# Patient Record
Sex: Male | Born: 1950
Health system: Southern US, Community
[De-identification: ages and names within clinical notes are randomized; demographics above are authoritative.]

## PROBLEM LIST (undated history)

## (undated) DIAGNOSIS — C189 Malignant neoplasm of colon, unspecified: Secondary | ICD-10-CM

## (undated) DIAGNOSIS — I10 Essential (primary) hypertension: Secondary | ICD-10-CM

## (undated) DIAGNOSIS — F329 Major depressive disorder, single episode, unspecified: Secondary | ICD-10-CM

## (undated) DIAGNOSIS — F32A Depression, unspecified: Secondary | ICD-10-CM

## (undated) DIAGNOSIS — Z1379 Encounter for other screening for genetic and chromosomal anomalies: Secondary | ICD-10-CM

## (undated) DIAGNOSIS — D374 Neoplasm of uncertain behavior of colon: Secondary | ICD-10-CM

## (undated) DIAGNOSIS — G629 Polyneuropathy, unspecified: Secondary | ICD-10-CM

## (undated) HISTORY — PX: HERNIA REPAIR: SHX51

## (undated) HISTORY — DX: Encounter for other screening for genetic and chromosomal anomalies: Z13.79

## (undated) HISTORY — PX: TONSILLECTOMY: SUR1361

---

## 2000-05-25 ENCOUNTER — Ambulatory Visit (HOSPITAL_BASED_OUTPATIENT_CLINIC_OR_DEPARTMENT_OTHER): Admission: RE | Admit: 2000-05-25 | Discharge: 2000-05-25 | Payer: Self-pay | Admitting: Family Medicine

## 2002-06-29 ENCOUNTER — Emergency Department (HOSPITAL_COMMUNITY): Admission: EM | Admit: 2002-06-29 | Discharge: 2002-06-29 | Payer: Self-pay | Admitting: Emergency Medicine

## 2016-12-10 ENCOUNTER — Emergency Department (HOSPITAL_COMMUNITY)
Admission: EM | Admit: 2016-12-10 | Discharge: 2016-12-10 | Disposition: A | Discharge status: Home / Self Care | Payer: Managed Care, Other (non HMO) | Attending: Emergency Medicine | Admitting: Emergency Medicine

## 2016-12-10 ENCOUNTER — Encounter (HOSPITAL_COMMUNITY): Payer: Self-pay | Admitting: Emergency Medicine

## 2016-12-10 DIAGNOSIS — Z79899 Other long term (current) drug therapy: Secondary | ICD-10-CM | POA: Insufficient documentation

## 2016-12-10 DIAGNOSIS — K625 Hemorrhage of anus and rectum: Secondary | ICD-10-CM | POA: Diagnosis not present

## 2016-12-10 LAB — CBC
HCT: 36.6 % — ABNORMAL LOW (ref 39.0–52.0)
Hemoglobin: 12.1 g/dL — ABNORMAL LOW (ref 13.0–17.0)
MCH: 28.3 pg (ref 26.0–34.0)
MCHC: 33.1 g/dL (ref 30.0–36.0)
MCV: 85.7 fL (ref 78.0–100.0)
Platelets: 191 10*3/uL (ref 150–400)
RBC: 4.27 MIL/uL (ref 4.22–5.81)
RDW: 14.7 % (ref 11.5–15.5)
WBC: 12.4 10*3/uL — ABNORMAL HIGH (ref 4.0–10.5)

## 2016-12-10 LAB — POC OCCULT BLOOD, ED: Fecal Occult Bld: POSITIVE — AB

## 2016-12-10 NOTE — ED Triage Notes (Signed)
Started with bright red bleeding from rectum today.  Pt notice dark red stool when wiping but did not look at stool in commode.  Seen by Dr Brigitte Pulse in [place] at end of December and told he had a small amount of blood in stool.  Told to check into colonoscopy.  Denies any pain at this time.  Last colonoscopy was 10 years ago.  Denies any issues with stools, stool are normal in color (brown) and soft.

## 2016-12-10 NOTE — ED Provider Notes (Signed)
[facility] DEPT Provider Note   CSN: [id number] Arrival date & time: 12/10/16  1317     History   Chief Complaint Chief Complaint  Patient presents with  . Rectal Bleeding    HPI Jeremy Stevenson is a 66 y.o. male.  HPI Patient reports having a bowel movement this afternoon and noted brown stool but blood on the tissue and thus he came to the ER for evaluation.  His never had rectal bleeding like this before.  He does report that he had a Hemoccult blood test that was positive in December.  He is trying to arrange an outpatient colonoscopy.  His last colonoscopy was 10 years ago.  He denies abdominal pain.  No fevers or chills.  No lightheadedness.  Denies recent nausea vomiting or diarrhea.  He reports he otherwise had a normal day and this occurred after lunch.  He's had no recurrent rectal bleeding since then    History reviewed. No pertinent past medical history.  There are no active problems to display for this patient.   Past Surgical History:  Procedure Laterality Date  . HERNIA REPAIR         Home Medications    Prior to Admission medications   Medication Sig Start Date End Date Taking? Authorizing Provider  amLODipine (NORVASC) 5 MG tablet Take 1 tablet by mouth at bedtime. 10/05/16  Yes Historical Provider, MD  chlorthalidone (HYGROTON) 25 MG tablet Take 1 tablet by mouth at bedtime. 11/13/16  Yes Historical Provider, MD  citalopram (CELEXA) 20 MG tablet Take 1 tablet by mouth at bedtime. 12/06/16  Yes Historical Provider, MD  glucosamine-chondroitin 500-400 MG tablet Take 2 tablets by mouth daily.   Yes Historical Provider, MD  losartan (COZAAR) 100 MG tablet Take 1 tablet by mouth at bedtime. 11/29/16  Yes Historical Provider, MD  metoprolol (LOPRESSOR) 100 MG tablet Take 1 tablet by mouth 2 (two) times daily. 11/28/16  Yes Historical Provider, MD  naproxen sodium (ANAPROX) 220 MG tablet Take 220 mg by mouth as needed (joint pain).   Yes Historical  Provider, MD    Family History No family history on file.  Social History Social History  Substance Use Topics  . Smoking status: Never Smoker  . Smokeless tobacco: Never Used  . Alcohol use No     Allergies   Patient has no known allergies.   Review of Systems Review of Systems  All other systems reviewed and are negative.    Physical Exam Updated Vital Signs BP 147/72 (BP Location: Left Arm)   Pulse 76   Temp 98 F (36.7 C) (Oral)   Resp 18   Ht 6\' 2"  (1.88 m)   Wt 300 lb (136.1 kg)   SpO2 97%   BMI 38.52 kg/m   Physical Exam  Constitutional: He is oriented to person, place, and time. He appears well-developed and well-nourished.  HENT:  Head: Normocephalic and atraumatic.  Eyes: EOM are normal.  Neck: Normal range of motion.  Cardiovascular: Normal rate and regular rhythm.   Pulmonary/Chest: Effort normal.  Abdominal: Soft. He exhibits no distension. There is no tenderness.  Genitourinary:  Genitourinary Comments: Normal external rectal examination.  No hemorrhoids or fissures noted.  Nontender digital rectal exam without gross blood.  Hemoccult positive.  Brown stool  Musculoskeletal: Normal range of motion.  Neurological: He is alert and oriented to person, place, and time.  Skin: Skin is warm and dry.  Psychiatric: He has a normal mood and affect. Judgment normal.  Nursing note and vitals reviewed.    ED Treatments / Results  Labs (all labs ordered are listed, but only abnormal results are displayed) Labs Reviewed  CBC - Abnormal; Notable for the following:       Result Value   WBC 12.4 (*)    Hemoglobin 12.1 (*)    HCT 36.6 (*)    All other components within normal limits  POC OCCULT BLOOD, ED - Abnormal; Notable for the following:    Fecal Occult Bld POSITIVE (*)    All other components within normal limits    EKG  EKG Interpretation None       Radiology No results found.  Procedures Procedures (including critical care  time)  Medications Ordered in ED Medications - No data to display   Initial Impression / Assessment and Plan / ED Course  I have reviewed the triage vital signs and the nursing notes.  Pertinent labs & imaging results that were available during my care of the patient were reviewed by me and considered in my medical decision making (see chart for details).     Vital signs stable.  No gross blood on rectal examination.  No recurrent rectal bleeding while in the emergency department.  Hemoglobin stable at 12.  Outpatient GI follow-up for likely need for repeat colonoscopy.  Patient understands to return to the ER for new or worsening symptoms.  Final Clinical Impressions(s) / ED Diagnoses   Final diagnoses:  Rectal bleeding    New Prescriptions New Prescriptions   No medications on file     Jola Schmidt, MD 12/10/16 [phone]

## 2016-12-19 ENCOUNTER — Emergency Department (HOSPITAL_COMMUNITY)
Admission: EM | Admit: 2016-12-19 | Discharge: 2016-12-19 | Disposition: A | Discharge status: Home / Self Care | Payer: Managed Care, Other (non HMO) | Attending: Emergency Medicine | Admitting: Emergency Medicine

## 2016-12-19 ENCOUNTER — Encounter (HOSPITAL_COMMUNITY): Payer: Self-pay

## 2016-12-19 DIAGNOSIS — K922 Gastrointestinal hemorrhage, unspecified: Secondary | ICD-10-CM | POA: Diagnosis not present

## 2016-12-19 DIAGNOSIS — I10 Essential (primary) hypertension: Secondary | ICD-10-CM | POA: Diagnosis not present

## 2016-12-19 DIAGNOSIS — K625 Hemorrhage of anus and rectum: Secondary | ICD-10-CM | POA: Diagnosis present

## 2016-12-19 DIAGNOSIS — Z79899 Other long term (current) drug therapy: Secondary | ICD-10-CM | POA: Insufficient documentation

## 2016-12-19 HISTORY — DX: Essential (primary) hypertension: I10

## 2016-12-19 LAB — COMPREHENSIVE METABOLIC PANEL
ALT: 17 U/L (ref 17–63)
AST: 24 U/L (ref 15–41)
Albumin: 3.4 g/dL — ABNORMAL LOW (ref 3.5–5.0)
Alkaline Phosphatase: 66 U/L (ref 38–126)
Anion gap: 10 (ref 5–15)
BUN: 20 mg/dL (ref 6–20)
CO2: 28 mmol/L (ref 22–32)
Calcium: 9.3 mg/dL (ref 8.9–10.3)
Chloride: 99 mmol/L — ABNORMAL LOW (ref 101–111)
Creatinine, Ser: 1.07 mg/dL (ref 0.61–1.24)
GFR calc Af Amer: >60 mL/min (ref >60)
GFR calc non Af Amer: >60 mL/min (ref >60)
Glucose, Bld: 163 mg/dL — ABNORMAL HIGH (ref 65–99)
Potassium: 3 mmol/L — ABNORMAL LOW (ref 3.5–5.1)
Sodium: 137 mmol/L (ref 135–145)
Total Bilirubin: 0.5 mg/dL (ref 0.3–1.2)
Total Protein: 6.7 g/dL (ref 6.5–8.1)

## 2016-12-19 LAB — CBC
HCT: 35 % — ABNORMAL LOW (ref 39.0–52.0)
Hemoglobin: 11.4 g/dL — ABNORMAL LOW (ref 13.0–17.0)
MCH: 27.9 pg (ref 26.0–34.0)
MCHC: 32.6 g/dL (ref 30.0–36.0)
MCV: 85.8 fL (ref 78.0–100.0)
Platelets: 225 10*3/uL (ref 150–400)
RBC: 4.08 MIL/uL — ABNORMAL LOW (ref 4.22–5.81)
RDW: 14.8 % (ref 11.5–15.5)
WBC: 11.8 10*3/uL — ABNORMAL HIGH (ref 4.0–10.5)

## 2016-12-19 LAB — PROTIME-INR
INR: 1
Prothrombin Time: 13.2 seconds (ref 11.4–15.2)

## 2016-12-19 LAB — TYPE AND SCREEN
ABO/RH(D): AB POS
Antibody Screen: NEGATIVE

## 2016-12-19 LAB — APTT: aPTT: 27 seconds (ref 24–36)

## 2016-12-19 MED ORDER — LORAZEPAM 2 MG/ML IJ SOLN
1.0000 mg | Freq: Once | INTRAMUSCULAR | Status: AC
  Administered 2016-12-19: 1 mg via INTRAVENOUS
  Filled 2016-12-19: qty 1

## 2016-12-19 NOTE — ED Triage Notes (Signed)
Pt was at work today and went to urinate and passed gas and a blood clot came out of rectum. Pt was seen last week for rectal bleeding and continues to wipe blood when he has BM. Stools vary from brown to dark in color

## 2016-12-19 NOTE — ED Provider Notes (Signed)
[facility] DEPT Provider Note   CSN: [id number] Arrival date & time: 12/19/16  1133   By signing my name below, I, [name], attest that this documentation has been prepared under the direction and in the presence of Noemi Chapel, MD. Electronically Signed: [name], Scribe. 12/19/16. 12:30 PM. History   Chief Complaint Chief Complaint  Patient presents with  . Rectal Bleeding     The history is provided by the patient. No language interpreter was used.   HPI Comments: Jeremy Stevenson is a 66 y.o. male who presents to the Emergency Department complaining of rectal bleeding which started about an hour ago. He was using the restroom when he noticed a lump about the size of a silver dollar. He was recently seen here on 12/10/2016 for the same problem. He reports noticing some blood on the tissue every time he wipes during each BM. He denies abdominal pain, nausea, fever, blurry vision, and dizziness. He also denies any pain during BM or on his buttocks. He had a colonoscopy around 10 years ago. He reports that he normally takes 2 to 3 tablets of aleve once a week. He has never had a blood transfusion. He denies having any abdominal surgeries in the past. He has a hx of smoking but hasn't smoked in 15 years. He denies taking blood thinners. The patient has called the GI doctors and requested a referral and has not received one yet.  Past Medical History:  Diagnosis Date  . Hypertension     There are no active problems to display for this patient.   Past Surgical History:  Procedure Laterality Date  . HERNIA REPAIR         Home Medications    Prior to Admission medications   Medication Sig Start Date End Date Taking? Authorizing Provider  amLODipine (NORVASC) 5 MG tablet Take 1 tablet by mouth at bedtime. 10/05/16   Historical Provider, MD  chlorthalidone (HYGROTON) 25 MG tablet Take 1 tablet by mouth at bedtime. 11/13/16   Historical Provider, MD    citalopram (CELEXA) 20 MG tablet Take 1 tablet by mouth at bedtime. 12/06/16   Historical Provider, MD  glucosamine-chondroitin 500-400 MG tablet Take 2 tablets by mouth daily.    Historical Provider, MD  losartan (COZAAR) 100 MG tablet Take 1 tablet by mouth at bedtime. 11/29/16   Historical Provider, MD  metoprolol (LOPRESSOR) 100 MG tablet Take 1 tablet by mouth 2 (two) times daily. 11/28/16   Historical Provider, MD  naproxen sodium (ANAPROX) 220 MG tablet Take 220 mg by mouth as needed (joint pain).    Historical Provider, MD    Family History No family history on file.  Social History Social History  Substance Use Topics  . Smoking status: Never Smoker  . Smokeless tobacco: Never Used  . Alcohol use No     Allergies   Patient has no known allergies.   Review of Systems Review of Systems  Constitutional: Negative for fever.  Eyes: Negative for visual disturbance.  Gastrointestinal: Positive for blood in stool. Negative for abdominal pain, nausea and vomiting.  Neurological: Negative for dizziness.  All other systems reviewed and are negative.   Physical Exam Updated Vital Signs BP 105/91 (BP Location: Left Arm)   Pulse 86   Temp 98.3 F (36.8 C) (Oral)   Resp 18   Ht 6\' 2"  (1.88 m)   Wt 300 lb (136.1 kg)   SpO2 99%   BMI 38.52 kg/m   Physical Exam  Constitutional: He is oriented to person, place, and time. He appears well-developed and well-nourished. No distress.  HENT:  Head: Normocephalic and atraumatic.  Mouth/Throat: Oropharynx is clear and moist. No oropharyngeal exudate.  Eyes: Conjunctivae and EOM are normal. Pupils are equal, round, and reactive to light. Right eye exhibits no discharge. Left eye exhibits no discharge. No scleral icterus.  Neck: Normal range of motion. Neck supple. No JVD present. No thyromegaly present.  Cardiovascular: Normal rate, regular rhythm, normal heart sounds and intact distal pulses.  Exam reveals no gallop and no friction  rub.   No murmur heard. Pulmonary/Chest: Effort normal and breath sounds normal. No respiratory distress. He has no wheezes. He has no rales.  Abdominal: Soft. Bowel sounds are normal. He exhibits no distension and no mass. There is no tenderness.  Musculoskeletal: Normal range of motion. He exhibits no tenderness.  1+ pitting edema.  Lymphadenopathy:    He has no cervical adenopathy.  Neurological: He is alert and oriented to person, place, and time. Coordination normal.  Skin: Skin is warm and dry. No rash noted. He is not diaphoretic. No erythema.  Clubbing of the fingers.  Psychiatric: He has a normal mood and affect. His behavior is normal.  Nursing note and vitals reviewed.    ED Treatments / Results  DIAGNOSTIC STUDIES: Oxygen Saturation is 99% on RA, normal by my interpretation.    COORDINATION OF CARE: 12:04 PM Discussed treatment plan with pt at bedside, which includes labs, and pt agreed to plan.  Labs (all labs ordered are listed, but only abnormal results are displayed) Labs Reviewed  CBC - Abnormal; Notable for the following:       Result Value   WBC 11.8 (*)    RBC 4.08 (*)    Hemoglobin 11.4 (*)    HCT 35.0 (*)    All other components within normal limits  COMPREHENSIVE METABOLIC PANEL  PROTIME-INR  APTT  POC OCCULT BLOOD, ED  TYPE AND SCREEN    Radiology No results found.  Procedures Procedures (including critical care time)  Medications Ordered in ED Medications  LORazepam (ATIVAN) injection 1 mg (1 mg Intravenous Given 12/19/16 1220)     Initial Impression / Assessment and Plan / ED Course  I have reviewed the triage vital signs and the nursing notes.  Pertinent labs & imaging results that were available during my care of the patient were reviewed by me and considered in my medical decision making (see chart for details).     Procedure Note:  Anoscopy  Risks benefits alternatives of the procedure given to the patient Verbal Consent  obtained Patient placed in the lateral decubitus position Anoscopy performed Findings  The pt has normal appear anus / rectum without any fissures  - there is some scaling and red skin on the gluteal fold that could be c/w prior fungal infection - he is currently under treatment by his PCP for this - the internal exam showed a dark red paste - gross blood - no stool, no hemorroids, no masses Patient tolerated procedure without any complaints  He reports that he is unable to be seen by GI until he has PCP referral - will d/w GI to place plan for patient - Hgb has dropped less than 1 gram since 12/10/16  D/w Dr. Laural Golden - at 2:00 PM - states will arrange for f/u and colonscopy next week - the pt is HD stable,  Pt agreeable.  12.1 - 11.4 drop - pt stable for  d/c.  Advised against asa or nsaid use - agreeable.  Office will call pt on Monday to arrange f/u.  Final Clinical Impressions(s) / ED Diagnoses   Final diagnoses:  Lower GI bleed    New Prescriptions New Prescriptions   No medications on file   I personally performed the services described in this documentation, which was scribed in my presence. The recorded information has been reviewed and is accurate.       Noemi Chapel, MD 12/19/16 [phone]

## 2016-12-19 NOTE — Discharge Instructions (Signed)
No Nsaids - ibuprofen / aspirin or aleve

## 2016-12-22 ENCOUNTER — Telehealth (INDEPENDENT_AMBULATORY_CARE_PROVIDER_SITE_OTHER): Payer: Self-pay | Admitting: *Deleted

## 2016-12-22 ENCOUNTER — Encounter (INDEPENDENT_AMBULATORY_CARE_PROVIDER_SITE_OTHER): Payer: Self-pay | Admitting: *Deleted

## 2016-12-22 MED ORDER — PEG 3350-KCL-NA BICARB-NACL 420 G PO SOLR: 4000.0000 mL | Freq: Once | ORAL | 0 refills | Status: AC

## 2016-12-22 NOTE — Telephone Encounter (Signed)
Patient needs trilyte 

## 2016-12-22 NOTE — Telephone Encounter (Signed)
Referring MD/PCP: shah   Procedure: tcs  Reason/Indication:  Rectal bleeding, anemia  Has patient had this procedure before?  Yes, 10 yrs ago  If so, when, by whom and where?    Is there a family history of colon cancer?  no  Who?  What age when diagnosed?    Is patient diabetic?   no      Does patient have prosthetic heart valve or mechanical valve?  no  Do you have a pacemaker?  no  Has patient ever had endocarditis? no  Has patient had joint replacement within last 12 months?  no  Does patient tend to be constipated or take laxatives? no  Does patient have a history of alcohol/drug use?  no  Is patient on Coumadin, Plavix and/or Aspirin? no  Medications: see epic  Allergies: pcn  Medication Adjustment:   Procedure date & time: 01/02/17 at 900

## 2016-12-22 NOTE — Telephone Encounter (Signed)
agree

## 2016-12-24 ENCOUNTER — Other Ambulatory Visit (INDEPENDENT_AMBULATORY_CARE_PROVIDER_SITE_OTHER): Payer: Self-pay | Admitting: *Deleted

## 2016-12-24 DIAGNOSIS — D508 Other iron deficiency anemias: Secondary | ICD-10-CM

## 2016-12-24 DIAGNOSIS — D649 Anemia, unspecified: Secondary | ICD-10-CM | POA: Insufficient documentation

## 2016-12-24 DIAGNOSIS — K625 Hemorrhage of anus and rectum: Secondary | ICD-10-CM | POA: Insufficient documentation

## 2017-01-02 ENCOUNTER — Encounter (HOSPITAL_COMMUNITY): Payer: Self-pay

## 2017-01-02 ENCOUNTER — Ambulatory Visit (HOSPITAL_COMMUNITY)
Admission: RE | Admit: 2017-01-02 | Discharge: 2017-01-02 | Disposition: A | Discharge status: Home / Self Care | Payer: Managed Care, Other (non HMO) | Source: Ambulatory Visit | Attending: Internal Medicine | Admitting: Internal Medicine

## 2017-01-02 ENCOUNTER — Encounter (HOSPITAL_COMMUNITY)
Admission: RE | Disposition: A | Discharge status: Home / Self Care | Payer: Self-pay | Source: Ambulatory Visit | Attending: Internal Medicine

## 2017-01-02 DIAGNOSIS — K635 Polyp of colon: Secondary | ICD-10-CM | POA: Diagnosis not present

## 2017-01-02 DIAGNOSIS — D500 Iron deficiency anemia secondary to blood loss (chronic): Secondary | ICD-10-CM | POA: Diagnosis not present

## 2017-01-02 DIAGNOSIS — K921 Melena: Secondary | ICD-10-CM | POA: Insufficient documentation

## 2017-01-02 DIAGNOSIS — K5140 Inflammatory polyps of colon without complications: Secondary | ICD-10-CM | POA: Diagnosis not present

## 2017-01-02 DIAGNOSIS — K56600 Partial intestinal obstruction, unspecified as to cause: Secondary | ICD-10-CM | POA: Diagnosis not present

## 2017-01-02 DIAGNOSIS — Z538 Procedure and treatment not carried out for other reasons: Secondary | ICD-10-CM | POA: Diagnosis not present

## 2017-01-02 DIAGNOSIS — K625 Hemorrhage of anus and rectum: Secondary | ICD-10-CM | POA: Insufficient documentation

## 2017-01-02 DIAGNOSIS — D508 Other iron deficiency anemias: Secondary | ICD-10-CM

## 2017-01-02 DIAGNOSIS — I10 Essential (primary) hypertension: Secondary | ICD-10-CM | POA: Insufficient documentation

## 2017-01-02 DIAGNOSIS — Z79899 Other long term (current) drug therapy: Secondary | ICD-10-CM | POA: Insufficient documentation

## 2017-01-02 DIAGNOSIS — K6389 Other specified diseases of intestine: Secondary | ICD-10-CM

## 2017-01-02 DIAGNOSIS — K5730 Diverticulosis of large intestine without perforation or abscess without bleeding: Secondary | ICD-10-CM | POA: Insufficient documentation

## 2017-01-02 DIAGNOSIS — D127 Benign neoplasm of rectosigmoid junction: Secondary | ICD-10-CM | POA: Diagnosis not present

## 2017-01-02 DIAGNOSIS — D649 Anemia, unspecified: Secondary | ICD-10-CM

## 2017-01-02 HISTORY — PX: COLONOSCOPY: SHX5424

## 2017-01-02 SURGERY — COLONOSCOPY
Anesthesia: Moderate Sedation

## 2017-01-02 MED ORDER — IOPAMIDOL (ISOVUE-300) INJECTION 61%
INTRAVENOUS | Status: AC
  Filled 2017-01-02: qty 30

## 2017-01-02 MED ORDER — MEPERIDINE HCL 50 MG/ML IJ SOLN
INTRAMUSCULAR | Status: DC | PRN
  Administered 2017-01-02 (×2): 25 mg via INTRAVENOUS

## 2017-01-02 MED ORDER — IOPAMIDOL (ISOVUE-300) INJECTION 61%
100.0000 mL | Freq: Once | INTRAVENOUS | Status: AC | PRN
  Administered 2017-01-02: 100 mL via INTRAVENOUS

## 2017-01-02 MED ORDER — MIDAZOLAM HCL 5 MG/5ML IJ SOLN
INTRAMUSCULAR | Status: DC | PRN
  Administered 2017-01-02: 1 mg via INTRAVENOUS
  Administered 2017-01-02 (×3): 2 mg via INTRAVENOUS

## 2017-01-02 MED ORDER — STERILE WATER FOR IRRIGATION IR SOLN
Status: DC | PRN
  Administered 2017-01-02: 2.5 mL

## 2017-01-02 MED ORDER — SODIUM CHLORIDE 0.9 % IV SOLN
INTRAVENOUS | Status: DC
  Administered 2017-01-02: 10:00:00 via INTRAVENOUS

## 2017-01-02 MED ORDER — MEPERIDINE HCL 50 MG/ML IJ SOLN
INTRAMUSCULAR | Status: AC
  Filled 2017-01-02: qty 1

## 2017-01-02 MED ORDER — MIDAZOLAM HCL 5 MG/5ML IJ SOLN
INTRAMUSCULAR | Status: AC
  Filled 2017-01-02: qty 10

## 2017-01-02 NOTE — Progress Notes (Signed)
Patient returned via wheelchair from Radiology. Dr. Laural Golden notified. Family at bedside.

## 2017-01-02 NOTE — Progress Notes (Signed)
To radiology for CT scan via wheelchair with wife.

## 2017-01-02 NOTE — Progress Notes (Signed)
Dr. Laural Golden in to discuss further plan of care after CT imaging completed with patient and family.

## 2017-01-02 NOTE — H&P (Addendum)
Jeremy Stevenson is an 66 y.o. male.   Chief Complaint: Patient is here for colonoscopy. HPI: Should 66 year old Caucasian male who was receiving noted to have heme-positive stool. Was also noted to have mild anemia with hemoglobin of 11.4 g. He noted single episode that he passes small amount of blood with piece of stool. He denies abdominal pain diarrhea or constipation. Last colonoscopy was 12 years ago and was normal. He rarely takes Aleve for knee pain or so. Family history is negative for CRC.  Past Medical History:  Diagnosis Date  . Hypertension     Past Surgical History:  Procedure Laterality Date  . HERNIA REPAIR      History reviewed. No pertinent family history. Social History:  reports that he has never smoked. He has never used smokeless tobacco. He reports that he does not drink alcohol or use drugs.  Allergies: No Known Allergies  Medications Prior to Admission  Medication Sig Dispense Refill  . amLODipine (NORVASC) 5 MG tablet Take 5 mg by mouth at bedtime.     . chlorthalidone (HYGROTON) 25 MG tablet Take 25 mg by mouth at bedtime.     . citalopram (CELEXA) 20 MG tablet Take 20 mg by mouth at bedtime.     Marland Kitchen losartan (COZAAR) 100 MG tablet Take 100 mg by mouth at bedtime.     . metoprolol (LOPRESSOR) 100 MG tablet Take 100 mg by mouth 2 (two) times daily.     . naproxen sodium (ANAPROX) 220 MG tablet Take 220 mg by mouth as needed (joint pain).      No results found for this or any previous visit (from the past 48 hour(s)). No results found.  ROS  Blood pressure 136/90, pulse 62, temperature 98.2 F (36.8 C), temperature source Oral, resp. rate 15, SpO2 99 %. Physical Exam  Constitutional: He appears well-developed and well-nourished.  HENT:  Mouth/Throat: Oropharynx is clear and moist.  Eyes: Conjunctivae are normal. No scleral icterus.  Neck: No thyromegaly present.  Cardiovascular: Normal rate, regular rhythm and normal heart sounds.   No murmur  heard. Respiratory: Effort normal and breath sounds normal.  GI:  Abdomen is full but small umbilical hernia. It is soft and  nontender without organomegaly or masses.  Musculoskeletal: He exhibits no edema.  Lymphadenopathy:    He has no cervical adenopathy.  Neurological: He is alert.  Skin: Skin is warm and dry.     Assessment/Plan Heme positive stool and anemia. Single episode of hematochezia. Diagnostic colonoscopy.  Hildred Laser, MD 01/02/2017, 11:09 AM

## 2017-01-02 NOTE — Discharge Instructions (Signed)
Resume usual medications and low fiber diet. No driving for 24 hours. Physician will call with results of CT and biopsy when ready.     Colonoscopy, Adult, Care After This sheet gives you information about how to care for yourself after your procedure. Your doctor may also give you more specific instructions. If you have problems or questions, call your doctor. Follow these instructions at home: General instructions  For the first 24 hours after the procedure:  Do not drive or use machinery.  Do not sign important documents.  Do not drink alcohol.  Do your daily activities more slowly than normal.  Eat foods that are soft and easy to digest.  Rest often.  Take over-the-counter or prescription medicines only as told by your doctor.  It is up to you to get the results of your procedure. Ask your doctor, or the department performing the procedure, when your results will be ready. To help cramping and bloating:  Try walking around.  Put heat on your belly (abdomen) as told by your doctor. Use a heat source that your doctor recommends, such as a moist heat pack or a heating pad.  Put a towel between your skin and the heat source.  Leave the heat on for 20-30 minutes.  Remove the heat if your skin turns bright red. This is especially important if you cannot feel pain, heat, or cold. You can get burned. Eating and drinking  Drink enough fluid to keep your pee (urine) clear or pale yellow.  Return to your normal diet as told by your doctor. Avoid heavy or fried foods that are hard to digest.  Avoid drinking alcohol for as long as told by your doctor. Contact a doctor if:  You have blood in your poop (stool) 2-3 days after the procedure. Get help right away if:  You have more than a small amount of blood in your poop.  You see large clumps of tissue (blood clots) in your poop.  Your belly is swollen.  You feel sick to your stomach (nauseous).  You throw up  (vomit).  You have a fever.  You have belly pain that gets worse, and medicine does not help your pain.    Colon Polyps Introduction Polyps are tissue growths inside the body. Polyps can grow in many places, including the large intestine (colon). A polyp may be a round bump or a mushroom-shaped growth. You could have one polyp or several. Most colon polyps are noncancerous (benign). However, some colon polyps can become cancerous over time. What are the causes? The exact cause of colon polyps is not known. What increases the risk? This condition is more likely to develop in people who:  Have a family history of colon cancer or colon polyps.  Are older than 21 or older than 45 if they are African American.  Have inflammatory bowel disease, such as ulcerative colitis or Crohn disease.  Are overweight.  Smoke cigarettes.  Do not get enough exercise.  Drink too much alcohol.  Eat a diet that is:  High in fat and red meat.  Low in fiber.  Had childhood cancer that was treated with abdominal radiation. What are the signs or symptoms? Most polyps do not cause symptoms. If you have symptoms, they may include:  Blood coming from your rectum when having a bowel movement.  Blood in your stool.The stool may look dark red or black.  A change in bowel habits, such as constipation or diarrhea. How is this diagnosed?  This condition is diagnosed with a colonoscopy. This is a procedure that uses a lighted, flexible scope to look at the inside of your colon. How is this treated? Treatment for this condition involves removing any polyps that are found. Those polyps will then be tested for cancer. If cancer is found, your health care provider will talk to you about options for colon cancer treatment. Follow these instructions at home: Diet  Eat plenty of fiber, such as fruits, vegetables, and whole grains.  Eat foods that are high in calcium and vitamin D, such as milk, cheese,  yogurt, eggs, liver, fish, and broccoli.  Limit foods high in fat, red meats, and processed meats, such as hot dogs, sausage, bacon, and lunch meats.  Maintain a healthy weight, or lose weight if recommended by your health care provider. General instructions  Do not smoke cigarettes.  Do not drink alcohol excessively.  Keep all follow-up visits as told by your health care provider. This is important. This includes keeping regularly scheduled colonoscopies. Talk to your health care provider about when you need a colonoscopy.  Exercise every day or as told by your health care provider. Contact a health care provider if:  You have new or worsening bleeding during a bowel movement.  You have new or increased blood in your stool.  You have a change in bowel habits.  You unexpectedly lose weight.   Low-Fiber Diet Fiber is found in fruits, vegetables, and whole grains. A low-fiber diet restricts fibrous foods that are not digested in the small intestine. A diet containing about 10-15 grams of fiber per day is considered low fiber. Low-fiber diets may be used to:  Promote healing and rest the bowel during intestinal flare-ups.  Prevent blockage of a partially obstructed or narrowed gastrointestinal tract.  Reduce fecal weight and volume.  Slow the movement of feces. You may be on a low-fiber diet as a transitional diet following surgery, after an injury (trauma), or because of a short (acute) or lifelong (chronic) illness. Your health care provider will determine the length of time you need to stay on this diet. What do I need to know about a low-fiber diet? Always check the fiber content on the packaging's Nutrition Facts label, especially on foods from the grains list. Ask your dietitian if you have questions about specific foods that are related to your condition, especially if the food is not listed below. In general, a low-fiber food will have less than 2 g of fiber. What foods can  I eat? Grains  All breads and crackers made with white flour. Sweet rolls, doughnuts, waffles, pancakes, [place] toast, bagels. Pretzels, Melba toast, zwieback. Well-cooked cereals, such as cornmeal, farina, or cream cereals. Dry cereals that do not contain whole grains, fruit, or nuts, such as refined corn, wheat, rice, and oat cereals. Potatoes prepared any way without skins, plain pastas and noodles, refined white rice. Use white flour for baking and making sauces. Use allowed list of grains for casseroles, dumplings, and puddings. Vegetables  Strained tomato and vegetable juices. Fresh lettuce, cucumber, spinach. Well-cooked (no skin or pulp) or canned vegetables, such as asparagus, bean sprouts, beets, carrots, green beans, mushrooms, potatoes, pumpkin, spinach, yellow squash, tomato sauce/puree, turnips, yams, and zucchini. Keep servings limited to  cup. Fruits  All fruit juices except prune juice. Cooked or canned fruits without skin and seeds, such as applesauce, apricots, cherries, fruit cocktail, grapefruit, grapes, mandarin oranges, melons, peaches, pears, pineapple, and plums. Fresh fruits without skin, such  as apricots, avocados, bananas, melons, pineapple, nectarines, and peaches. Keep servings limited to  cup or 1 piece. Meat and Other Protein Sources  Ground or well-cooked tender beef, ham, veal, lamb, pork, or poultry. Eggs, plain cheese. Fish, oysters, shrimp, lobster, and other seafood. Liver, organ meats. Smooth nut butters. Dairy  All milk products and alternative dairy substitutes, such as soy, rice, almond, and coconut, not containing added whole nuts, seeds, or added fruit. Beverages  Decaf coffee, fruit, and vegetable juices or smoothies (small amounts, with no pulp or skins, and with fruits from allowed list), sports drinks, herbal tea. Condiments  Ketchup, mustard, vinegar, cream sauce, cheese sauce, cocoa powder. Spices in moderation, such as allspice, basil, bay leaves,  celery powder or leaves, cinnamon, cumin powder, curry powder, ginger, mace, marjoram, onion or garlic powder, oregano, paprika, parsley flakes, ground pepper, rosemary, sage, savory, tarragon, thyme, and turmeric. Sweets and Desserts  Plain cakes and cookies, pie made with allowed fruit, pudding, custard, cream pie. Gelatin, fruit, ice, sherbet, frozen ice pops. Ice cream, ice milk without nuts. Plain hard candy, honey, jelly, molasses, syrup, sugar, chocolate syrup, gumdrops, marshmallows. Limit overall sugar intake. Fats and Oil  Margarine, butter, cream, mayonnaise, salad oils, plain salad dressings made from allowed foods. Choose healthy fats such as olive oil, canola oil, and omega-3 fatty acids (such as found in salmon or tuna) when possible. Other  Bouillon, broth, or cream soups made from allowed foods. Any strained soup. Casseroles or mixed dishes made with allowed foods. The items listed above may not be a complete list of recommended foods or beverages. Contact your dietitian for more options.  What foods are not recommended? Grains  All whole wheat and whole grain breads and crackers. Multigrains, rye, bran seeds, nuts, or coconut. Cereals containing whole grains, multigrains, bran, coconut, nuts, raisins. Cooked or dry oatmeal, steel-cut oats. Coarse wheat cereals, granola. Cereals advertised as high fiber. Potato skins. Whole grain pasta, wild or brown rice. Popcorn. Coconut flour. Bran, buckwheat, corn bread, multigrains, rye, wheat germ. Vegetables  Fresh, cooked or canned vegetables, such as artichokes, asparagus, beet greens, broccoli, Brussels sprouts, cabbage, celery, cauliflower, corn, eggplant, kale, legumes or beans, okra, peas, and tomatoes. Avoid large servings of any vegetables, especially raw vegetables. Fruits  Fresh fruits, such as apples with or without skin, berries, cherries, figs, grapes, grapefruit, guavas, kiwis, mangoes, oranges, papayas, pears, persimmons,  pineapple, and pomegranate. Prune juice and juices with pulp, stewed or dried prunes. Dried fruits, dates, raisins. Fruit seeds or skins. Avoid large servings of all fresh fruits. Meats and Other Protein Sources  Tough, fibrous meats with gristle. Chunky nut butter. Cheese made with seeds, nuts, or other foods not recommended. Nuts, seeds, legumes (beans, including baked beans), dried peas, beans, lentils. Dairy  Yogurt or cheese that contains nuts, seeds, or added fruit. Beverages  Fruit juices with high pulp, prune juice. Caffeinated coffee and teas. Condiments  Coconut, maple syrup, pickles, olives. Sweets and Desserts  Desserts, cookies, or candies that contain nuts or coconut, chunky peanut butter, dried fruits. Jams, preserves with seeds, marmalade. Large amounts of sugar and sweets. Any other dessert made with fruits from the not recommended list. Other  Soups made from vegetables that are not recommended or that contain other foods not recommended. The items listed above may not be a complete list of foods and beverages to avoid. Contact your dietitian for more information.  This information is not intended to replace advice given to you by your health  care provider. Make sure you discuss any questions you have with your health care provider. Document Released: 04/25/2002 Document Revised: 04/10/2016 Document Reviewed: 09/26/2013 Elsevier Interactive Patient Education  2017 [employer].

## 2017-01-02 NOTE — Op Note (Signed)
[facility] Patient Name: Jeremy Stevenson Procedure Date: 01/02/2017 10:44 AM MRN: [id number] Date of Birth: 20-Jan-1951 Attending MD: Hildred Laser , MD CSN: [id number] Age: 66 Admit Type: Outpatient Procedure:                Colonoscopy Indications:              Hematochezia, Iron deficiency anemia secondary to                            chronic blood loss Providers:                Hildred Laser, MD, Charlyne Petrin RN, RN, Aram Candela Referring MD:             Monico Blitz, MD Medicines:                Meperidine 50 mg IV, Midazolam 7 mg IV Complications:            No immediate complications. Estimated Blood Loss:     Estimated blood loss was minimal. Procedure:                Pre-Anesthesia Assessment:                           - Prior to the procedure, a History and Physical                            was performed, and patient medications and                            allergies were reviewed. The patient's tolerance of                            previous anesthesia was also reviewed. The risks                            and benefits of the procedure and the sedation                            options and risks were discussed with the patient.                            All questions were answered, and informed consent                            was obtained. Prior Anticoagulants: The patient has                            taken no previous anticoagulant or antiplatelet                            agents. ASA Grade Assessment: II - A patient with  mild systemic disease. After reviewing the risks                            and benefits, the patient was deemed in                            satisfactory condition to undergo the procedure.                           After obtaining informed consent, the colonoscope                            was passed under direct vision. Throughout the                            procedure, the  patient's blood pressure, pulse, and                            oxygen saturations were monitored continuously. The                            [id number]) scope was introduced through                            the anus with the intention of advancing to the                            ileum. The scope was advanced to the sigmoid colon                            before the procedure was aborted. Medications were                            given. The [id number]) scope was introduced                            through the and advanced to the. The colonoscopy                            was aborted due to a partially obstructing mass.                            Withdrawing the scope and replacing with the                            'babyscope' did not allow for the successful                            completion of the procedure. The colonoscopy was                            performed without difficulty. The patient tolerated  the procedure well. The quality of the bowel                            preparation was adequate. The rectum was                            photographed. Scope In: 11:19:17 AM Scope Out: 11:49:46 AM Total Procedure Duration: 0 hours 30 minutes 29 seconds  Findings:      The perianal and digital rectal examinations were normal.      Multiple medium-mouthed diverticula were found in the sigmoid colon.      A large polypoid lesion was found in the proximal sigmoid colon. The       lesion was fungating. No bleeding was present. This was biopsied with a       cold forceps for histology.      A 6 mm polyp was found in the recto-sigmoid colon. The polyp was       sessile. The polyp was removed with a cold snare. Resection and       retrieval were complete.      The retroflexed view of the distal rectum and anal verge was normal and       showed no anal or rectal abnormalities. Impression:               - The procedure was aborted  due to partially                            obstructing mass.                           - Diverticulosis in the sigmoid colon.                           - Likely malignant polypoid lesion in the proximal                            sigmoid colon nearly obstructing the lumen.                            Biopsied.                           - One 6 mm polyp at the recto-sigmoid colon,                            removed with a cold snare. Resected and retrieved.                           - The distal rectum and anal verge are normal on                            retroflexion view. Moderate Sedation:      Moderate (conscious) sedation was administered by the endoscopy nurse       and supervised by the endoscopist. The following parameters were       monitored: oxygen saturation, heart rate, blood pressure, CO2  capnography and response to care. Total physician intraservice time was       35 minutes. Recommendation:           - Patient has a contact number available for                            emergencies. The signs and symptoms of potential                            delayed complications were discussed with the                            patient. Return to normal activities tomorrow.                            Written discharge instructions were provided to the                            patient.                           - Low fiber diet today.                           - Continue present medications.                           - Await pathology results.                           - Abdomino-pelvic CT with contrast today.                           - Repeat colonoscopy in 3 years for surveillance. Procedure Code(s):        --- Professional ---                           [phone], 52, Colonoscopy, flexible; with removal of                            tumor(s), polyp(s), or other lesion(s) by snare                            technique                           45380, 59,52, Colonoscopy,  flexible; with biopsy,                            single or multiple                           99152, Moderate sedation services provided by the                            same physician or other qualified health care  professional performing the diagnostic or                            therapeutic service that the sedation supports,                            requiring the presence of an independent trained                            observer to assist in the monitoring of the                            patient's level of consciousness and physiological                            status; initial 15 minutes of intraservice time,                            patient age 72 years or older                           [phone], Moderate sedation services; each additional                            15 minutes intraservice time Diagnosis Code(s):        --- Professional ---                           Z53.8, Procedure and treatment not carried out for                            other reasons                           D49.0, Neoplasm of unspecified behavior of                            digestive system                           D12.7, Benign neoplasm of rectosigmoid junction                           K92.1, Melena (includes Hematochezia)                           D50.0, Iron deficiency anemia secondary to blood                            loss (chronic)                           K57.30, Diverticulosis of large intestine without                            perforation or abscess without bleeding CPT copyright 2016 American [employer].  All rights reserved. The codes documented in this report are preliminary and upon coder review may  be revised to meet current compliance requirements. Hildred Laser, MD Hildred Laser, MD 01/02/2017 12:12:38 PM This report has been signed electronically. Number of Addenda: 0

## 2017-01-05 ENCOUNTER — Telehealth (INDEPENDENT_AMBULATORY_CARE_PROVIDER_SITE_OTHER): Payer: Self-pay | Admitting: Internal Medicine

## 2017-01-05 NOTE — Telephone Encounter (Signed)
Patient's spouse Geraldo Pitter called and left a message last night and stated that Dr. Laural Golden did a procedure on her husband Friday afternoon and that Dr. Laural Golden saw a mass.  She stated that Dr. Laural Golden stated that he wanted to do surgery early this week.  She'd like to speak to Dr. Laural Golden to see what is going on, if he found out anything, and to see what's going to happen.  Seemed very concerned.  [phone]

## 2017-01-05 NOTE — Telephone Encounter (Signed)
Per Dr.Rehman he advised the patient and his wife that he would call them when he got the BX results in. This may be later this afternoon or tomorrow .  Patient's wife was called and given this information.

## 2017-01-07 ENCOUNTER — Encounter (HOSPITAL_COMMUNITY): Payer: Self-pay | Admitting: Internal Medicine

## 2017-01-07 ENCOUNTER — Other Ambulatory Visit: Payer: Self-pay | Admitting: General Surgery

## 2017-01-14 NOTE — Patient Instructions (Signed)
Tracker Jeremy Stevenson  01/14/2017   Your procedure is scheduled on: 01/20/2017    Report to [facility] Main  Entrance take [place]  elevators to 3rd floor to  [place] at    0800 AM.  Call this number if you have problems the morning of surgery [phone]   Remember: ONLY 1 PERSON MAY GO WITH YOU TO SHORT STAY TO GET  READY MORNING OF [place].  Do not eat food or drink liquids :After Midnight.             Clear liquid diet the day of bowel prep.  Remember to drink plenty of clear liquids on the day of bowel prep.       Take these medicines the morning of surgery with A SIP OF WATER: metoprolol ( Lopressor)                                You may not have any metal on your body including hair pins and              piercings  Do not wear jewelry, , lotions, powders or perfumes, deodorant                          Men may shave face and neck.   Do not bring valuables to the hospital. [place].  Contacts, dentures or bridgework may not be worn into surgery.  Leave suitcase in the car. After surgery it may be brought to your room.                     Please read over the following fact sheets you were given: _____________________________________________________________________                CLEAR LIQUID DIET   Foods Allowed                                                                     Foods Excluded  Coffee and tea, regular and decaf                             liquids that you cannot  Plain Jell-O in any flavor                                             see through such as: Fruit ices (not with fruit pulp)                                     milk, soups, orange juice  Iced Popsicles  All solid food Carbonated beverages, regular and diet                                    Cranberry, grape and apple juices Sports drinks like Gatorade Lightly seasoned  clear broth or consume(fat free) Sugar, honey syrup  Sample Menu Breakfast                                Lunch                                     Supper Cranberry juice                    Beef broth                            Chicken broth Jell-O                                     Grape juice                           Apple juice Coffee or tea                        Jell-O                                      Popsicle                                                Coffee or tea                        Coffee or tea  _____________________________________________________________________  [facility] - Preparing for Surgery Before surgery, you can play an important role.  Because skin is not sterile, your skin needs to be as free of germs as possible.  You can reduce the number of germs on your skin by washing with CHG (chlorahexidine gluconate) soap before surgery.  CHG is an antiseptic cleaner which kills germs and bonds with the skin to continue killing germs even after washing. Please DO NOT use if you have an allergy to CHG or antibacterial soaps.  If your skin becomes reddened/irritated stop using the CHG and inform your nurse when you arrive at Short Stay. Do not shave (including legs and underarms) for at least 48 hours prior to the first CHG shower.  You may shave your face/neck. Please follow these instructions carefully:  1.  Shower with CHG Soap the night before surgery and the  morning of Surgery.  2.  If you choose to wash your hair, wash your hair first as usual with your  normal  shampoo.  3.  After you shampoo, rinse your hair and body thoroughly to remove the  shampoo.  4.  Use CHG as you would any other liquid soap.  You can apply chg directly  to the skin and wash                       Gently with a scrungie or clean washcloth.  5.  Apply the CHG Soap to your body ONLY FROM THE NECK DOWN.   Do not use on face/ open                           Wound or  open sores. Avoid contact with eyes, ears mouth and genitals (private parts).                       Wash face,  Genitals (private parts) with your normal soap.             6.  Wash thoroughly, paying special attention to the area where your surgery  will be performed.  7.  Thoroughly rinse your body with warm water from the neck down.  8.  DO NOT shower/wash with your normal soap after using and rinsing off  the CHG Soap.                9.  Pat yourself dry with a clean towel.            10.  Wear clean pajamas.            11.  Place clean sheets on your bed the night of your first shower and do not  sleep with pets. Day of Surgery : Do not apply any lotions/deodorants the morning of surgery.  Please wear clean clothes to the hospital/surgery center.  FAILURE TO FOLLOW THESE INSTRUCTIONS MAY RESULT IN THE CANCELLATION OF YOUR SURGERY PATIENT SIGNATURE_________________________________  NURSE SIGNATURE__________________________________  ________________________________________________________________________  WHAT IS A BLOOD TRANSFUSION? Blood Transfusion Information  A transfusion is the replacement of blood or some of its parts. Blood is made up of multiple cells which provide different functions.  Red blood cells carry oxygen and are used for blood loss replacement.  White blood cells fight against infection.  Platelets control bleeding.  Plasma helps clot blood.  Other blood products are available for specialized needs, such as hemophilia or other clotting disorders. BEFORE THE TRANSFUSION  Who gives blood for transfusions?   Healthy volunteers who are fully evaluated to make sure their blood is safe. This is blood bank blood. Transfusion therapy is the safest it has ever been in the practice of medicine. Before blood is taken from a donor, a complete history is taken to make sure that person has no history of diseases nor engages in risky social behavior (examples are  intravenous drug use or sexual activity with multiple partners). The donor'Jeremy travel history is screened to minimize risk of transmitting infections, such as malaria. The donated blood is tested for signs of infectious diseases, such as HIV and hepatitis. The blood is then tested to be sure it is compatible with you in order to minimize the chance of a transfusion reaction. If you or a relative donates blood, this is often done in anticipation of surgery and is not appropriate for emergency situations. It takes many days to process the donated blood. RISKS AND COMPLICATIONS Although transfusion therapy is very safe and saves many lives, the main dangers of transfusion include:   Getting an infectious disease.  Developing a transfusion reaction.  This is an allergic reaction to something in the blood you were given. Every precaution is taken to prevent this. The decision to have a blood transfusion has been considered carefully by your caregiver before blood is given. Blood is not given unless the benefits outweigh the risks. AFTER THE TRANSFUSION  Right after receiving a blood transfusion, you will usually feel much better and more energetic. This is especially true if your red blood cells have gotten low (anemic). The transfusion raises the level of the red blood cells which carry oxygen, and this usually causes an energy increase.  The nurse administering the transfusion will monitor you carefully for complications. HOME CARE INSTRUCTIONS  No special instructions are needed after a transfusion. You may find your energy is better. Speak with your caregiver about any limitations on activity for underlying diseases you may have. SEEK MEDICAL CARE IF:   Your condition is not improving after your transfusion.  You develop redness or irritation at the intravenous (IV) site. SEEK IMMEDIATE MEDICAL CARE IF:  Any of the following symptoms occur over the next 12 hours:  Shaking chills.  You have a  temperature by mouth above 102 F (38.9 C), not controlled by medicine.  Chest, back, or muscle pain.  People around you feel you are not acting correctly or are confused.  Shortness of breath or difficulty breathing.  Dizziness and fainting.  You get a rash or develop hives.  You have a decrease in urine output.  Your urine turns a dark color or changes to pink, red, or brown. Any of the following symptoms occur over the next 10 days:  You have a temperature by mouth above 102 F (38.9 C), not controlled by medicine.  Shortness of breath.  Weakness after normal activity.  The white part of the eye turns yellow (jaundice).  You have a decrease in the amount of urine or are urinating less often.  Your urine turns a dark color or changes to pink, red, or brown. Document Released: 10/31/2000 Document Revised: 01/26/2012 Document Reviewed: 06/19/2008 [facility] Patient Information 2014 [place], [place].  _______________________________________________________________________

## 2017-01-16 ENCOUNTER — Ambulatory Visit (HOSPITAL_COMMUNITY)
Admission: RE | Admit: 2017-01-16 | Discharge: 2017-01-16 | Disposition: A | Discharge status: Home / Self Care | Payer: Managed Care, Other (non HMO) | Source: Ambulatory Visit | Attending: General Surgery | Admitting: General Surgery

## 2017-01-16 ENCOUNTER — Encounter (HOSPITAL_COMMUNITY)
Admission: RE | Admit: 2017-01-16 | Discharge: 2017-01-16 | Disposition: A | Discharge status: Home / Self Care | Payer: Managed Care, Other (non HMO) | Source: Ambulatory Visit | Attending: General Surgery | Admitting: General Surgery

## 2017-01-16 ENCOUNTER — Encounter (HOSPITAL_COMMUNITY): Payer: Self-pay

## 2017-01-16 DIAGNOSIS — Z01812 Encounter for preprocedural laboratory examination: Secondary | ICD-10-CM | POA: Insufficient documentation

## 2017-01-16 DIAGNOSIS — Z01810 Encounter for preprocedural cardiovascular examination: Secondary | ICD-10-CM | POA: Insufficient documentation

## 2017-01-16 DIAGNOSIS — Z01818 Encounter for other preprocedural examination: Secondary | ICD-10-CM

## 2017-01-16 LAB — URINALYSIS, ROUTINE W REFLEX MICROSCOPIC
Bilirubin Urine: NEGATIVE
Glucose, UA: NEGATIVE mg/dL
Hgb urine dipstick: NEGATIVE
Ketones, ur: NEGATIVE mg/dL
Leukocytes, UA: NEGATIVE
Nitrite: NEGATIVE
Protein, ur: NEGATIVE mg/dL
Specific Gravity, Urine: 1.014 (ref 1.005–1.030)
pH: 7 (ref 5.0–8.0)

## 2017-01-16 LAB — COMPREHENSIVE METABOLIC PANEL
ALT: 16 U/L — ABNORMAL LOW (ref 17–63)
AST: 18 U/L (ref 15–41)
Albumin: 3.5 g/dL (ref 3.5–5.0)
Alkaline Phosphatase: 75 U/L (ref 38–126)
Anion gap: 7 (ref 5–15)
BUN: 15 mg/dL (ref 6–20)
CO2: 32 mmol/L (ref 22–32)
Calcium: 9.7 mg/dL (ref 8.9–10.3)
Chloride: 98 mmol/L — ABNORMAL LOW (ref 101–111)
Creatinine, Ser: 1 mg/dL (ref 0.61–1.24)
GFR calc Af Amer: >60 mL/min (ref >60)
GFR calc non Af Amer: >60 mL/min (ref >60)
Glucose, Bld: 124 mg/dL — ABNORMAL HIGH (ref 65–99)
Potassium: 3.9 mmol/L (ref 3.5–5.1)
Sodium: 137 mmol/L (ref 135–145)
Total Bilirubin: 0.8 mg/dL (ref 0.3–1.2)
Total Protein: 7.2 g/dL (ref 6.5–8.1)

## 2017-01-16 LAB — CBC WITH DIFFERENTIAL/PLATELET
Basophils Absolute: 0 10*3/uL (ref 0.0–0.1)
Basophils Relative: 0 %
Eosinophils Absolute: 0.7 10*3/uL (ref 0.0–0.7)
Eosinophils Relative: 5 %
HCT: 36.3 % — ABNORMAL LOW (ref 39.0–52.0)
Hemoglobin: 11.6 g/dL — ABNORMAL LOW (ref 13.0–17.0)
Lymphocytes Relative: 16 %
Lymphs Abs: 2 10*3/uL (ref 0.7–4.0)
MCH: 26.9 pg (ref 26.0–34.0)
MCHC: 32 g/dL (ref 30.0–36.0)
MCV: 84.2 fL (ref 78.0–100.0)
Monocytes Absolute: 0.9 10*3/uL (ref 0.1–1.0)
Monocytes Relative: 8 %
Neutro Abs: 8.7 10*3/uL — ABNORMAL HIGH (ref 1.7–7.7)
Neutrophils Relative %: 71 %
Platelets: 256 10*3/uL (ref 150–400)
RBC: 4.31 MIL/uL (ref 4.22–5.81)
RDW: 14.5 % (ref 11.5–15.5)
WBC: 12.4 10*3/uL — ABNORMAL HIGH (ref 4.0–10.5)

## 2017-01-16 LAB — ABO/RH: ABO/RH(D): AB POS

## 2017-01-16 NOTE — Progress Notes (Signed)
   01/16/17 0859  OBSTRUCTIVE SLEEP APNEA  Have you ever been diagnosed with sleep apnea through a sleep study? No  Do you snore loudly (loud enough to be heard through closed doors)?  1  Do you often feel tired, fatigued, or sleepy during the daytime (such as falling asleep during driving or talking to someone)? 0  Has anyone observed you stop breathing during your sleep? 1  Do you have, or are you being treated for high blood pressure? 1  BMI more than 35 kg/m2? 1  Age > 50 (1-yes) 1  Neck circumference greater than:Male 16 inches or larger, Male 17inches or larger? 1  Male Gender (Yes=1) 1  Obstructive Sleep Apnea Score 7

## 2017-01-17 LAB — HEMOGLOBIN A1C
Hgb A1c MFr Bld: 6.5 % — ABNORMAL HIGH (ref 4.8–5.6)
Mean Plasma Glucose: 140 mg/dL

## 2017-01-17 LAB — CEA: CEA: 27 ng/mL — ABNORMAL HIGH (ref 0.0–4.7)

## 2017-01-19 NOTE — H&P (Signed)
Jeremy Stevenson Location: [facility] Patient #: [id number] DOB: July 21, 1951 Married / Language: English / Race: White Male        History of Present Illness       This is a 66 year old man, referred by Dr. Laural Golden in [facility] for a neoplastic mass of the proximal sigmoid colon. Dr. Manuella Ghazi s his PCP in [place].       He has no prior history of intestinal disease. He knows he has a small umbilical hernia. He recently saw some blood in his stool on 2 separate occasions. This has all been within the last month. He denies abdominal pain, cramps, diarrhea. He says his bowel movements are normal in texture and caliber. He was noted to have Hemoccult positive stools and a hemoglobin of 11.4.      Colonoscopy was performed on January 02, 2017. There was a polypoid mass in the proximal sigmoid colon and and a tiny hyperplastic polyp at the rectosigmoid junction. They were unable to pass the scope beyond the mass. Biopsies just showed necrotic tissue. CT scan shows an 8 cm mass in the proximal sigmoid almost at the sigmoid descending junction. The question whether this might be intussuscepting but there was no obstruction or dilatation of the small bowel or large bowel. There was nonspecific portal hepatitis lymphadenopathy. Some atherosclerotic changes in the aortic vessels but no aneurysm. BPH noted. Fat containing umbilical and inguinal hernias. CEA is ordered.       Past history reveals that one doctor told him he has sleep apnea but no devices. Hernia repair as an infant. Elevated PSA. BMI 38. Former smoker. Hypertension.  family history is negative for colorectal cancer. Father died of myocardial infarction. Mother had breast cancer. Sister had ovarian cancer He is married. His wife and daughter are here with him. 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       We had a long talk. I let him know that although the biopsy does not  show cancer, that this should be treated as a cancer until proven otherwise. Because of the partially obstructing nature of this I advised double dose stool softeners twice a day and a soft diet. I advised expediting his surgery in hopes of a single-stage resection. He agrees.       He will be scheduled for laparoscopic-assisted left colectomy, possible open colectomy. He will receive mechanical and antibiotic bowel prep preop and we went over that with him. I discussed the indications, details, techniques, and numerous risk of the surgery with him and his family. He is aware of the risk of bleeding, infection, large incision, wound hernia, injury to adjacent organs with major reconstructive surgery including the ureter and major vessels, anastomotic leak with reoperation and colostomy, cardiac pulmonary and thromboembolic problems. He knows that he is at increased risk because of his weight. He understands all these issues. At this time all of his questions are answered. He agrees with this plan.    Past Surgical History  Colon Polyp Removal - Colonoscopy  Colon Polyp Removal - Open  Tonsillectomy  Vasectomy   Diagnostic Studies History  Colonoscopy  within last year  Allergies  No Known Drug Allergies   Medication History  AmLODIPine Besylate (5MG  Tablet, Oral) Active. Chlorthalidone (25MG  Tablet, Oral) Active. Citalopram Hydrobromide (20MG  Tablet, Oral) Active. Losartan Potassium (100MG  Tablet, Oral) Active. Metoprolol Tartrate (100MG  Tablet, Oral) Active. Medications Reconciled  Social History  Alcohol use  Remotely quit alcohol use.  Caffeine use  Carbonated beverages, Coffee, Tea. Tobacco use  Former smoker.  Family History  Arthritis  Mother. Breast Cancer  Mother. Heart Disease  Father.  Other Problems  Bladder Problems  High blood pressure     Review of Systems General Not Present- Appetite Loss, Chills, Fatigue, Fever, Night  Sweats, Weight Gain and Weight Loss. Skin Not Present- Change in Wart/Mole, Dryness, Hives, Jaundice, New Lesions, Non-Healing Wounds, Rash and Ulcer. HEENT Present- Hearing Loss. Not Present- Earache, Hoarseness, Nose Bleed, Oral Ulcers, Ringing in the Ears, Seasonal Allergies, Sinus Pain, Sore Throat, Visual Disturbances, Wears glasses/contact lenses and Yellow Eyes. Cardiovascular Present- Shortness of Breath. Not Present- Chest Pain, Difficulty Breathing Lying Down, Leg Cramps, Palpitations, Rapid Heart Rate and Swelling of Extremities. Gastrointestinal Present- Bloody Stool. Not Present- Abdominal Pain, Bloating, Change in Bowel Habits, Chronic diarrhea, Constipation, Difficulty Swallowing, Excessive gas, Gets full quickly at meals, Hemorrhoids, Indigestion, Nausea, Rectal Pain and Vomiting. Male Genitourinary Present- Frequency. Not Present- Blood in Urine, Change in Urinary Stream, Impotence, Nocturia, Painful Urination, Urgency and Urine Leakage.  Vitals  Weight: 296.5 lb Height: 74in Body Surface Area: 2.57 m Body Mass Index: 38.07 kg/m  Pulse: 65 (Regular)  BP: 142/86 (Sitting, Left Arm, Standard)   Physical Exam  General Mental Status-Alert. General Appearance-Consistent with stated age. Hydration-Well hydrated. Voice-Normal. Note: Large man. BMI 38   Head and Neck Head-normocephalic, atraumatic with no lesions or palpable masses. Trachea-midline. Thyroid Gland Characteristics - normal size and consistency.  Eye Eyeball - Bilateral-Extraocular movements intact. Sclera/Conjunctiva - Bilateral-No scleral icterus.  Chest and Lung Exam Chest and lung exam reveals -quiet, even and easy respiratory effort with no use of accessory muscles and on auscultation, normal breath sounds, no adventitious sounds and normal vocal resonance. Inspection Chest Wall - Normal. Back - normal.  Breast Breast - Left-Symmetric, Non Tender, No Biopsy scars, no  Dimpling, No Inflammation, No Lumpectomy scars, No Mastectomy scars, No Peau d' Orange. Breast - Right-Symmetric, Non Tender, No Biopsy scars, no Dimpling, No Inflammation, No Lumpectomy scars, No Mastectomy scars, No Peau d' Orange. Breast Lump-No Palpable Breast Mass.  Cardiovascular Cardiovascular examination reveals -normal heart sounds, regular rate and rhythm with no murmurs and normal pedal pulses bilaterally.  Abdomen Inspection  Inspection of the abdomen reveals: Note: Partially incarcerated small umbilical hernia. Skin - Scar - Note: Transverse scar and inguinal area. I do not detect a mass or hernia in the inguinal area. Palpation/Percussion Palpation and Percussion of the abdomen reveal - Soft, Non Tender, No Rebound tenderness, No Rigidity (guarding) and No hepatosplenomegaly. Auscultation Auscultation of the abdomen reveals - Bowel sounds normal.  Neurologic Neurologic evaluation reveals -alert and oriented x 3 with no impairment of recent or remote memory. Mental Status-Normal.  Musculoskeletal Normal Exam - Left-Upper Extremity Strength Normal and Lower Extremity Strength Normal. Normal Exam - Right-Upper Extremity Strength Normal and Lower Extremity Strength Normal.  Lymphatic Head & Neck  General Head & Neck Lymphatics: Bilateral - Description - Normal. Axillary  General Axillary Region: Bilateral - Description - Normal. Tenderness - Non Tender. Femoral & Inguinal  Generalized Femoral & Inguinal Lymphatics: Bilateral - Description - Normal. Tenderness - Non Tender.    Assessment & Plan  NEOPLASM OF SIGMOID COLON (D49.0)  Current Plans Started Neomycin Sulfate 500MG , 2 (two) Tablet SEE NOTE, #6, 01/07/2017, No Refill. Local Order: TAKE TWO TABLETS AT 2 PM, 3 PM, AND 10 PM THE DAY PRIOR TO SURGERY Started Flagyl 500MG , 2 (two) Tablet SEE NOTE, #6, 01/07/2017, No  Refill. Local Order: Take at 2pm, 3pm, and 10pm the day prior to your colon  operation    Your recent colonoscopy shows a large polypoid mass in the proximal sigmoid colon The biopsy does not show cancer cells but we are suspicious that this may be a partially obstructing cancer The CT scan shows an 8 cm mass in this location, but there is no sign of cancer elsewhere on the CT scan  you will be scheduled for a laparoscopic assisted left colectomy We have discussed the indications, techniques, and risks of this surgery in detail We will try to expedite this surgery You have requested that we do this at [facility] hospital and we will try to accommodate that you will need to undergo a mechanical bowel prep and antibiotic bowel prep the day before surgery we discussed this in detail   In the meantime, take 2 Colace tablets in the morning and 2 Colace tablets in the evening In the meantime follow a soft diet as we discussed. Stay away from high grain bread, fiber, and raw vegetables     BMI 38.0-38.9,ADULT (Z68.38) FORMER SMOKER (Z87.891) HYPERTENSION, BENIGN (I10)    Orlean Holtrop M. Dalbert Batman, M.D., [facility] General and Minimally invasive Surgery Breast and Colorectal Surgery Office:   [phone] Pager:   [phone]

## 2017-01-19 NOTE — Progress Notes (Signed)
CEA results routed via epic to Dr Lemmie Evens. Jeremy Stevenson

## 2017-01-20 ENCOUNTER — Encounter (HOSPITAL_COMMUNITY)
Admission: RE | Disposition: A | Discharge status: Home / Self Care | Payer: Self-pay | Source: Ambulatory Visit | Attending: General Surgery

## 2017-01-20 ENCOUNTER — Inpatient Hospital Stay (HOSPITAL_COMMUNITY): Payer: Managed Care, Other (non HMO) | Admitting: Certified Registered Nurse Anesthetist

## 2017-01-20 ENCOUNTER — Encounter (HOSPITAL_COMMUNITY): Payer: Self-pay

## 2017-01-20 ENCOUNTER — Inpatient Hospital Stay (HOSPITAL_COMMUNITY)
Admission: RE | Admit: 2017-01-20 | Discharge: 2017-01-24 | DRG: 330 | Disposition: A | Discharge status: Home / Self Care | Payer: Managed Care, Other (non HMO) | Source: Ambulatory Visit | Attending: General Surgery | Admitting: General Surgery

## 2017-01-20 DIAGNOSIS — Z87891 Personal history of nicotine dependence: Secondary | ICD-10-CM

## 2017-01-20 DIAGNOSIS — Z803 Family history of malignant neoplasm of breast: Secondary | ICD-10-CM

## 2017-01-20 DIAGNOSIS — K4090 Unilateral inguinal hernia, without obstruction or gangrene, not specified as recurrent: Secondary | ICD-10-CM | POA: Diagnosis present

## 2017-01-20 DIAGNOSIS — D374 Neoplasm of uncertain behavior of colon: Secondary | ICD-10-CM

## 2017-01-20 DIAGNOSIS — C186 Malignant neoplasm of descending colon: Principal | ICD-10-CM | POA: Diagnosis present

## 2017-01-20 DIAGNOSIS — Z6838 Body mass index (BMI) 38.0-38.9, adult: Secondary | ICD-10-CM | POA: Diagnosis not present

## 2017-01-20 DIAGNOSIS — Z79899 Other long term (current) drug therapy: Secondary | ICD-10-CM | POA: Diagnosis not present

## 2017-01-20 DIAGNOSIS — K429 Umbilical hernia without obstruction or gangrene: Secondary | ICD-10-CM | POA: Diagnosis present

## 2017-01-20 DIAGNOSIS — D500 Iron deficiency anemia secondary to blood loss (chronic): Secondary | ICD-10-CM | POA: Diagnosis present

## 2017-01-20 DIAGNOSIS — I10 Essential (primary) hypertension: Secondary | ICD-10-CM | POA: Diagnosis present

## 2017-01-20 DIAGNOSIS — G4730 Sleep apnea, unspecified: Secondary | ICD-10-CM | POA: Diagnosis present

## 2017-01-20 DIAGNOSIS — N400 Enlarged prostate without lower urinary tract symptoms: Secondary | ICD-10-CM | POA: Diagnosis present

## 2017-01-20 DIAGNOSIS — Z8249 Family history of ischemic heart disease and other diseases of the circulatory system: Secondary | ICD-10-CM

## 2017-01-20 DIAGNOSIS — D62 Acute posthemorrhagic anemia: Secondary | ICD-10-CM | POA: Diagnosis not present

## 2017-01-20 DIAGNOSIS — K639 Disease of intestine, unspecified: Secondary | ICD-10-CM | POA: Diagnosis present

## 2017-01-20 HISTORY — PX: LAPAROSCOPIC SIGMOID COLECTOMY: SHX5928

## 2017-01-20 HISTORY — DX: Neoplasm of uncertain behavior of colon: D37.4

## 2017-01-20 LAB — CBC
HCT: 32.8 % — ABNORMAL LOW (ref 39.0–52.0)
Hemoglobin: 10.5 g/dL — ABNORMAL LOW (ref 13.0–17.0)
MCH: 26.6 pg (ref 26.0–34.0)
MCHC: 32 g/dL (ref 30.0–36.0)
MCV: 83 fL (ref 78.0–100.0)
Platelets: 234 10*3/uL (ref 150–400)
RBC: 3.95 MIL/uL — ABNORMAL LOW (ref 4.22–5.81)
RDW: 14.6 % (ref 11.5–15.5)
WBC: 20.6 10*3/uL — ABNORMAL HIGH (ref 4.0–10.5)

## 2017-01-20 LAB — TYPE AND SCREEN
ABO/RH(D): AB POS
Antibody Screen: NEGATIVE

## 2017-01-20 LAB — CREATININE, SERUM
Creatinine, Ser: 1.63 mg/dL — ABNORMAL HIGH (ref 0.61–1.24)
GFR calc Af Amer: 50 mL/min — ABNORMAL LOW (ref >60)
GFR calc non Af Amer: 43 mL/min — ABNORMAL LOW (ref >60)

## 2017-01-20 SURGERY — COLECTOMY, SIGMOID, LAPAROSCOPIC
Anesthesia: General | Site: Abdomen

## 2017-01-20 MED ORDER — SUCCINYLCHOLINE CHLORIDE 200 MG/10ML IV SOSY
PREFILLED_SYRINGE | INTRAVENOUS | Status: AC
  Filled 2017-01-20: qty 10

## 2017-01-20 MED ORDER — ALVIMOPAN 12 MG PO CAPS
12.0000 mg | ORAL_CAPSULE | Freq: Once | ORAL | Status: AC
  Administered 2017-01-20: 12 mg via ORAL
  Filled 2017-01-20: qty 1

## 2017-01-20 MED ORDER — FENTANYL CITRATE (PF) 100 MCG/2ML IJ SOLN
INTRAMUSCULAR | Status: DC | PRN
  Administered 2017-01-20 (×5): 50 ug via INTRAVENOUS

## 2017-01-20 MED ORDER — DEXTROSE 5 % IV SOLN
2.0000 g | Freq: Two times a day (BID) | INTRAVENOUS | Status: AC
  Administered 2017-01-20: 2 g via INTRAVENOUS
  Filled 2017-01-20: qty 2

## 2017-01-20 MED ORDER — HYDROMORPHONE HCL 2 MG/ML IJ SOLN
INTRAMUSCULAR | Status: AC
  Filled 2017-01-20: qty 1

## 2017-01-20 MED ORDER — HYDROMORPHONE HCL 1 MG/ML IJ SOLN
INTRAMUSCULAR | Status: DC | PRN
  Administered 2017-01-20: 1.6 mg via INTRAVENOUS
  Administered 2017-01-20: .4 mg via INTRAVENOUS

## 2017-01-20 MED ORDER — FENTANYL CITRATE (PF) 250 MCG/5ML IJ SOLN
INTRAMUSCULAR | Status: AC
  Filled 2017-01-20: qty 5

## 2017-01-20 MED ORDER — CHLORHEXIDINE GLUCONATE 4 % EX LIQD: 60.0000 mL | Freq: Once | CUTANEOUS | Status: DC

## 2017-01-20 MED ORDER — SUGAMMADEX SODIUM 200 MG/2ML IV SOLN
INTRAVENOUS | Status: DC | PRN
  Administered 2017-01-20: 270 mg via INTRAVENOUS

## 2017-01-20 MED ORDER — 0.9 % SODIUM CHLORIDE (POUR BTL) OPTIME
TOPICAL | Status: DC | PRN
  Administered 2017-01-20: 5000 mL

## 2017-01-20 MED ORDER — KCL-LACTATED RINGERS-D5W 20 MEQ/L IV SOLN
INTRAVENOUS | Status: DC
  Administered 2017-01-20 (×2): via INTRAVENOUS
  Administered 2017-01-21: 100 mL/h via INTRAVENOUS
  Administered 2017-01-22: 03:00:00 via INTRAVENOUS
  Filled 2017-01-20 (×5): qty 1000

## 2017-01-20 MED ORDER — EPHEDRINE 5 MG/ML INJ
INTRAVENOUS | Status: AC
  Filled 2017-01-20: qty 10

## 2017-01-20 MED ORDER — HYDROMORPHONE HCL 1 MG/ML IJ SOLN: 0.2500 mg | INTRAMUSCULAR | Status: DC | PRN

## 2017-01-20 MED ORDER — PHENYLEPHRINE HCL 10 MG/ML IJ SOLN
INTRAMUSCULAR | Status: AC
  Filled 2017-01-20: qty 1

## 2017-01-20 MED ORDER — BUPIVACAINE-EPINEPHRINE 0.5% -1:200000 IJ SOLN
INTRAMUSCULAR | Status: DC | PRN
  Administered 2017-01-20: 15 mL

## 2017-01-20 MED ORDER — ONDANSETRON HCL 4 MG/2ML IJ SOLN: 4.0000 mg | Freq: Four times a day (QID) | INTRAMUSCULAR | Status: DC | PRN

## 2017-01-20 MED ORDER — AMLODIPINE BESYLATE 5 MG PO TABS
5.0000 mg | ORAL_TABLET | Freq: Every day | ORAL | Status: DC
  Administered 2017-01-21 – 2017-01-23 (×3): 5 mg via ORAL
  Filled 2017-01-20 (×3): qty 1

## 2017-01-20 MED ORDER — DEXTROSE 5 % IV SOLN
2.0000 g | INTRAVENOUS | Status: DC
  Filled 2017-01-20 (×2): qty 2

## 2017-01-20 MED ORDER — ROCURONIUM BROMIDE 50 MG/5ML IV SOSY
PREFILLED_SYRINGE | INTRAVENOUS | Status: AC
  Filled 2017-01-20: qty 5

## 2017-01-20 MED ORDER — ALBUMIN HUMAN 5 % IV SOLN
INTRAVENOUS | Status: AC
  Filled 2017-01-20: qty 250

## 2017-01-20 MED ORDER — SUGAMMADEX SODIUM 500 MG/5ML IV SOLN
INTRAVENOUS | Status: AC
  Filled 2017-01-20: qty 5

## 2017-01-20 MED ORDER — ENOXAPARIN SODIUM 40 MG/0.4ML ~~LOC~~ SOLN
40.0000 mg | SUBCUTANEOUS | Status: DC
  Administered 2017-01-21 – 2017-01-24 (×4): 40 mg via SUBCUTANEOUS
  Filled 2017-01-20 (×4): qty 0.4

## 2017-01-20 MED ORDER — ALBUMIN HUMAN 5 % IV SOLN
INTRAVENOUS | Status: DC | PRN
  Administered 2017-01-20 (×3): via INTRAVENOUS

## 2017-01-20 MED ORDER — ONDANSETRON HCL 4 MG PO TABS: 4.0000 mg | ORAL_TABLET | Freq: Four times a day (QID) | ORAL | Status: DC | PRN

## 2017-01-20 MED ORDER — PROPOFOL 10 MG/ML IV BOLUS
INTRAVENOUS | Status: AC
  Filled 2017-01-20: qty 20

## 2017-01-20 MED ORDER — SODIUM CHLORIDE 0.9 % IJ SOLN
INTRAMUSCULAR | Status: AC
  Filled 2017-01-20: qty 10

## 2017-01-20 MED ORDER — DEXAMETHASONE SODIUM PHOSPHATE 10 MG/ML IJ SOLN
INTRAMUSCULAR | Status: DC | PRN
  Administered 2017-01-20: 10 mg via INTRAVENOUS

## 2017-01-20 MED ORDER — LACTATED RINGERS IV SOLN
INTRAVENOUS | Status: DC
  Administered 2017-01-20 (×4): via INTRAVENOUS

## 2017-01-20 MED ORDER — SODIUM CHLORIDE 0.9 % IV SOLN
INTRAVENOUS | Status: DC | PRN
  Administered 2017-01-20: 25 ug/min via INTRAVENOUS

## 2017-01-20 MED ORDER — ROCURONIUM BROMIDE 50 MG/5ML IV SOSY
PREFILLED_SYRINGE | INTRAVENOUS | Status: DC | PRN
  Administered 2017-01-20: 20 mg via INTRAVENOUS
  Administered 2017-01-20: 10 mg via INTRAVENOUS
  Administered 2017-01-20: 60 mg via INTRAVENOUS
  Administered 2017-01-20 (×2): 10 mg via INTRAVENOUS
  Administered 2017-01-20: 20 mg via INTRAVENOUS

## 2017-01-20 MED ORDER — DEXAMETHASONE SODIUM PHOSPHATE 10 MG/ML IJ SOLN
INTRAMUSCULAR | Status: AC
  Filled 2017-01-20: qty 1

## 2017-01-20 MED ORDER — PANTOPRAZOLE SODIUM 40 MG IV SOLR
40.0000 mg | INTRAVENOUS | Status: DC
  Administered 2017-01-20 – 2017-01-22 (×3): 40 mg via INTRAVENOUS
  Filled 2017-01-20 (×3): qty 40

## 2017-01-20 MED ORDER — CEFOTETAN DISODIUM-DEXTROSE 2-2.08 GM-% IV SOLR
2.0000 g | INTRAVENOUS | Status: AC
  Administered 2017-01-20: 2 g via INTRAVENOUS
  Filled 2017-01-20: qty 50

## 2017-01-20 MED ORDER — HYDROMORPHONE HCL 1 MG/ML IJ SOLN
1.0000 mg | INTRAMUSCULAR | Status: DC | PRN
  Administered 2017-01-20 – 2017-01-22 (×6): 1 mg via INTRAVENOUS
  Filled 2017-01-20 (×6): qty 1

## 2017-01-20 MED ORDER — METOPROLOL TARTRATE 50 MG PO TABS
100.0000 mg | ORAL_TABLET | Freq: Two times a day (BID) | ORAL | Status: DC
  Administered 2017-01-20 – 2017-01-24 (×8): 100 mg via ORAL
  Filled 2017-01-20 (×8): qty 2

## 2017-01-20 MED ORDER — CHLORTHALIDONE 25 MG PO TABS: 25.0000 mg | ORAL_TABLET | Freq: Every day | ORAL | Status: DC

## 2017-01-20 MED ORDER — LOSARTAN POTASSIUM 50 MG PO TABS
100.0000 mg | ORAL_TABLET | Freq: Every day | ORAL | Status: DC
  Administered 2017-01-21 – 2017-01-23 (×3): 100 mg via ORAL
  Filled 2017-01-20 (×3): qty 2

## 2017-01-20 MED ORDER — ONDANSETRON HCL 4 MG/2ML IJ SOLN
INTRAMUSCULAR | Status: DC | PRN
  Administered 2017-01-20: 4 mg via INTRAVENOUS

## 2017-01-20 MED ORDER — PROPOFOL 10 MG/ML IV BOLUS
INTRAVENOUS | Status: DC | PRN
  Administered 2017-01-20: 200 mg via INTRAVENOUS

## 2017-01-20 MED ORDER — PROMETHAZINE HCL 25 MG/ML IJ SOLN: 6.2500 mg | INTRAMUSCULAR | Status: DC | PRN

## 2017-01-20 MED ORDER — ONDANSETRON HCL 4 MG/2ML IJ SOLN
INTRAMUSCULAR | Status: AC
  Filled 2017-01-20: qty 2

## 2017-01-20 MED ORDER — SUCCINYLCHOLINE CHLORIDE 200 MG/10ML IV SOSY
PREFILLED_SYRINGE | INTRAVENOUS | Status: DC | PRN
  Administered 2017-01-20: 120 mg via INTRAVENOUS

## 2017-01-20 MED ORDER — LIDOCAINE 2% (20 MG/ML) 5 ML SYRINGE
INTRAMUSCULAR | Status: AC
  Filled 2017-01-20: qty 5

## 2017-01-20 MED ORDER — MIDAZOLAM HCL 5 MG/5ML IJ SOLN
INTRAMUSCULAR | Status: DC | PRN
  Administered 2017-01-20: 2 mg via INTRAVENOUS

## 2017-01-20 MED ORDER — EPHEDRINE SULFATE 50 MG/ML IJ SOLN
INTRAMUSCULAR | Status: DC | PRN
  Administered 2017-01-20 (×3): 5 mg via INTRAVENOUS
  Administered 2017-01-20: 10 mg via INTRAVENOUS

## 2017-01-20 MED ORDER — ALVIMOPAN 12 MG PO CAPS
12.0000 mg | ORAL_CAPSULE | Freq: Two times a day (BID) | ORAL | Status: DC
  Administered 2017-01-21 (×2): 12 mg via ORAL
  Filled 2017-01-20 (×5): qty 1

## 2017-01-20 MED ORDER — BUPIVACAINE-EPINEPHRINE (PF) 0.5% -1:200000 IJ SOLN
INTRAMUSCULAR | Status: AC
  Filled 2017-01-20: qty 30

## 2017-01-20 MED ORDER — MIDAZOLAM HCL 2 MG/2ML IJ SOLN
INTRAMUSCULAR | Status: AC
  Filled 2017-01-20: qty 2

## 2017-01-20 MED ORDER — LIDOCAINE 2% (20 MG/ML) 5 ML SYRINGE
INTRAMUSCULAR | Status: DC | PRN
  Administered 2017-01-20: 100 mg via INTRAVENOUS

## 2017-01-20 MED ORDER — MEPERIDINE HCL 50 MG/ML IJ SOLN: 6.2500 mg | INTRAMUSCULAR | Status: DC | PRN

## 2017-01-20 MED ORDER — LACTATED RINGERS IR SOLN
Status: DC | PRN
  Administered 2017-01-20: 1000 mL

## 2017-01-20 SURGICAL SUPPLY — 80 items
APPLIER CLIP 5 13 M/L LIGAMAX5 (MISCELLANEOUS) ×2
APPLIER CLIP ROT 10 11.4 M/L (STAPLE) ×2
APR CLP MED LRG 11.4X10 (STAPLE) ×1
APR CLP MED LRG 5 ANG JAW (MISCELLANEOUS) ×1
BLADE EXTENDED COATED 6.5IN (ELECTRODE) ×3 IMPLANT
BLADE HEX COATED 2.75 (ELECTRODE) ×2 IMPLANT
CELLS DAT CNTRL 66122 CELL SVR (MISCELLANEOUS) ×1 IMPLANT
CLIP APPLIE 5 13 M/L LIGAMAX5 (MISCELLANEOUS) ×1 IMPLANT
CLIP APPLIE ROT 10 11.4 M/L (STAPLE) ×1 IMPLANT
COVER SURGICAL LIGHT HANDLE (MISCELLANEOUS) ×2 IMPLANT
DECANTER SPIKE VIAL GLASS SM (MISCELLANEOUS) ×2 IMPLANT
DEVICE TROCAR PUNCTURE CLOSURE (ENDOMECHANICALS) ×2 IMPLANT
DRAPE LAPAROSCOPIC ABDOMINAL (DRAPES) ×2 IMPLANT
DRAPE POUCH INSTRU U-SHP 10X18 (DRAPES) ×2 IMPLANT
DRSG OPSITE POSTOP 4X10 (GAUZE/BANDAGES/DRESSINGS) ×1 IMPLANT
DRSG TELFA 3X8 NADH (GAUZE/BANDAGES/DRESSINGS) ×2 IMPLANT
ELECT PENCIL ROCKER SW 15FT (MISCELLANEOUS) ×1 IMPLANT
ELECT REM PT RETURN 9FT ADLT (ELECTROSURGICAL) ×2
ELECTRODE REM PT RTRN 9FT ADLT (ELECTROSURGICAL) ×1 IMPLANT
ENSEAL DEVICE STD TIP 35CM (ENDOMECHANICALS) ×2 IMPLANT
GAUZE SPONGE 4X4 12PLY STRL (GAUZE/BANDAGES/DRESSINGS) ×2 IMPLANT
GLOVE BIO SURGEON STRL SZ 6.5 (GLOVE) ×4 IMPLANT
GLOVE BIOGEL PI IND STRL 6.5 (GLOVE) IMPLANT
GLOVE BIOGEL PI IND STRL 7.0 (GLOVE) ×1 IMPLANT
GLOVE BIOGEL PI IND STRL 7.5 (GLOVE) IMPLANT
GLOVE BIOGEL PI INDICATOR 6.5 (GLOVE) ×4
GLOVE BIOGEL PI INDICATOR 7.0 (GLOVE) ×3
GLOVE BIOGEL PI INDICATOR 7.5 (GLOVE) ×4
GLOVE ECLIPSE 6.5 STRL STRAW (GLOVE) ×2 IMPLANT
GLOVE EUDERMIC 7 POWDERFREE (GLOVE) ×3 IMPLANT
GLOVE SURG SIGNA 7.5 PF LTX (GLOVE) ×4 IMPLANT
GOWN STRL REUS W/TWL LRG LVL3 (GOWN DISPOSABLE) ×3 IMPLANT
GOWN STRL REUS W/TWL XL LVL3 (GOWN DISPOSABLE) ×6 IMPLANT
HANDLE SUCTION POOLE (INSTRUMENTS) ×1 IMPLANT
IRRIG SUCT STRYKERFLOW 2 WTIP (MISCELLANEOUS) ×2
IRRIGATION SUCT STRKRFLW 2 WTP (MISCELLANEOUS) ×1 IMPLANT
LEGGING LITHOTOMY PAIR STRL (DRAPES) ×2 IMPLANT
LIGASURE IMPACT 36 18CM CVD LR (INSTRUMENTS) ×1 IMPLANT
PACK COLON (CUSTOM PROCEDURE TRAY) ×2 IMPLANT
PAD DRESSING TELFA 3X8 NADH (GAUZE/BANDAGES/DRESSINGS) IMPLANT
PAD POSITIONING PINK XL (MISCELLANEOUS) ×2 IMPLANT
POSITIONER SURGICAL ARM (MISCELLANEOUS) IMPLANT
RELOAD PROXIMATE 75MM BLUE (ENDOMECHANICALS) ×4 IMPLANT
RELOAD STAPLE 75 3.8 BLU REG (ENDOMECHANICALS) IMPLANT
RETRACTOR WND ALEXIS 18 MED (MISCELLANEOUS) ×1 IMPLANT
RTRCTR WOUND ALEXIS 18CM MED (MISCELLANEOUS) ×2
SCISSORS LAP 5X35 DISP (ENDOMECHANICALS) ×2 IMPLANT
SCISSORS LAP 5X45 EPIX DISP (ENDOMECHANICALS) ×1 IMPLANT
SEALER TISSUE X1 CVD JAW (INSTRUMENTS) ×1 IMPLANT
SHEARS HARMONIC ACE PLUS 36CM (ENDOMECHANICALS) ×2 IMPLANT
SHEARS HARMONIC ACE PLUS 45CM (MISCELLANEOUS) ×1 IMPLANT
SLEEVE XCEL OPT CAN 5 100 (ENDOMECHANICALS) ×2 IMPLANT
SOLUTION ANTI FOG 6CC (MISCELLANEOUS) ×2 IMPLANT
SPONGE LAP 18X18 X RAY DECT (DISPOSABLE) ×8 IMPLANT
STAPLER GUN LINEAR PROX 60 (STAPLE) ×2 IMPLANT
STAPLER PROXIMATE 75MM BLUE (STAPLE) ×1 IMPLANT
STAPLER VISISTAT 35W (STAPLE) ×2 IMPLANT
STRIP CLOSURE SKIN 1/2X4 (GAUZE/BANDAGES/DRESSINGS) ×2 IMPLANT
SUCTION POOLE HANDLE (INSTRUMENTS) ×2
SUT NOVA 1 T20/GS 25DT (SUTURE) ×3 IMPLANT
SUT PDS AB 1 CT1 27 (SUTURE) ×2 IMPLANT
SUT PDS AB 1 TP1 96 (SUTURE) ×3 IMPLANT
SUT SILK 2 0 (SUTURE) ×2
SUT SILK 2 0 30  PSL (SUTURE) ×2
SUT SILK 2 0 30 PSL (SUTURE) ×2 IMPLANT
SUT SILK 2 0 SH CR/8 (SUTURE) ×2 IMPLANT
SUT SILK 2-0 18XBRD TIE 12 (SUTURE) ×1 IMPLANT
SUT SILK 3 0 (SUTURE) ×2
SUT SILK 3 0 SH CR/8 (SUTURE) ×3 IMPLANT
SUT SILK 3-0 18XBRD TIE 12 (SUTURE) ×1 IMPLANT
SYS LAPSCP GELPORT 120MM (MISCELLANEOUS) ×2
SYSTEM LAPSCP GELPORT 120MM (MISCELLANEOUS) IMPLANT
TAPE CLOTH 4X10 WHT NS (GAUZE/BANDAGES/DRESSINGS) ×1 IMPLANT
TOWEL OR 17X26 10 PK STRL BLUE (TOWEL DISPOSABLE) ×4 IMPLANT
TRAY FOLEY W/METER SILVER 16FR (SET/KITS/TRAYS/PACK) ×2 IMPLANT
TROCAR XCEL BLUNT TIP 100MML (ENDOMECHANICALS) ×2 IMPLANT
TROCAR XCEL NON-BLD 11X100MML (ENDOMECHANICALS) ×2 IMPLANT
TROCAR XCEL UNIV SLVE 11M 100M (ENDOMECHANICALS) ×2 IMPLANT
TUBING INSUF HEATED (TUBING) ×2 IMPLANT
YANKAUER SUCT BULB TIP NO VENT (SUCTIONS) ×2 IMPLANT

## 2017-01-20 NOTE — Anesthesia Procedure Notes (Signed)
Procedure Name: Intubation Date/Time: 01/20/2017 10:35 AM Performed by: Maxwell Caul Pre-anesthesia Checklist: Patient identified, Emergency Drugs available, Suction available and Patient being monitored Patient Re-evaluated:Patient Re-evaluated prior to inductionOxygen Delivery Method: Circle system utilized Preoxygenation: Pre-oxygenation with 100% oxygen Intubation Type: IV induction Ventilation: Mask ventilation without difficulty and Oral airway inserted - appropriate to patient size Laryngoscope Size: Mac and 4 Grade View: Grade III Tube type: Oral Tube size: 8.0 mm Number of attempts: 1 Airway Equipment and Method: Stylet and Oral airway Placement Confirmation: ETT inserted through vocal cords under direct vision,  positive ETCO2 and breath sounds checked- equal and bilateral Secured at: 22 cm Tube secured with: Tape Dental Injury: Teeth and Oropharynx as per pre-operative assessment

## 2017-01-20 NOTE — Transfer of Care (Signed)
Immediate Anesthesia Transfer of Care Note  Patient: Jeremy Stevenson  Procedure(s) Performed: Procedure(s): LAPAROSCOPIC LEFT COLECTOMY, TAKEDOWN SPLENIC FLEXURE, AND BIOPSY OF PERITONEAL NODULE (N/A)  Patient Location: PACU  Anesthesia Type:General  Level of Consciousness: sedated, patient cooperative and responds to stimulation  Airway & Oxygen Therapy: Patient Spontanous Breathing and Patient connected to face mask oxygen  Post-op Assessment: Report given to RN, Post -op Vital signs reviewed and stable and Patient moving all extremities X 4  Post vital signs: stable  Last Vitals:  Vitals:   01/20/17 1458 01/20/17 1500  BP: 130/74 119/86  Pulse: 73 75  Resp: (!) 28   Temp: 36.8 C     Last Pain:  Vitals:   01/20/17 0814  TempSrc: Oral      Patients Stated Pain Goal: 3 ([id number])  Complications: No apparent anesthesia complications

## 2017-01-20 NOTE — Interval H&P Note (Signed)
History and Physical Interval Note:  01/20/2017 9:32 AM  Jeremy Stevenson  has presented today for surgery, with the diagnosis of Neoplastic mass of sigmoid colon  The various methods of treatment have been discussed with the patient and family. After consideration of risks, benefits and other options for treatment, the patient has consented to  Procedure(s): LAPAROSCOPIC LEFT COLECTOMY POSSIBLE OPEN COLECTOMY (N/A) as a surgical intervention .  The patient's history has been reviewed, patient examined, no change in status, stable for surgery.  I have reviewed the patient's chart and labs.  Questions were answered to the patient's satisfaction.     Adin Hector

## 2017-01-20 NOTE — Anesthesia Preprocedure Evaluation (Signed)
Anesthesia Evaluation  Patient identified by MRN, date of birth, ID band Patient awake    Reviewed: Allergy & Precautions, NPO status , Patient's Chart, lab work & pertinent test results, reviewed documented beta blocker date and time   Airway Mallampati: II  TM Distance: >3 FB Neck ROM: Full    Dental  (+) Caps   Pulmonary neg pulmonary ROS,    Pulmonary exam normal breath sounds clear to auscultation       Cardiovascular hypertension, Pt. on medications and Pt. on home beta blockers Normal cardiovascular exam Rhythm:Regular Rate:Normal     Neuro/Psych negative neurological ROS  negative psych ROS   GI/Hepatic Neg liver ROS, Sigmoid colon neoplasm   Endo/Other  Obesity   Renal/GU negative Renal ROS  negative genitourinary   Musculoskeletal negative musculoskeletal ROS (+)   Abdominal (+) + obese,   Peds  Hematology  (+) anemia ,   Anesthesia Other Findings   Reproductive/Obstetrics                             Anesthesia Physical Anesthesia Plan  ASA: II  Anesthesia Plan: General   Post-op Pain Management:    Induction: Intravenous  Airway Management Planned: Oral ETT  Additional Equipment:   Intra-op Plan:   Post-operative Plan: Extubation in OR  Informed Consent: I have reviewed the patients History and Physical, chart, labs and discussed the procedure including the risks, benefits and alternatives for the proposed anesthesia with the patient or authorized representative who has indicated his/her understanding and acceptance.   Dental advisory given  Plan Discussed with: Anesthesiologist, CRNA and Surgeon  Anesthesia Plan Comments:         Anesthesia Quick Evaluation

## 2017-01-20 NOTE — Anesthesia Postprocedure Evaluation (Signed)
Anesthesia Post Note  Patient: Jeremy Stevenson  Procedure(s) Performed: Procedure(s) (LRB): LAPAROSCOPIC LEFT COLECTOMY, TAKEDOWN SPLENIC FLEXURE, AND BIOPSY OF PERITONEAL NODULE (N/A)  Patient location during evaluation: PACU Anesthesia Type: General Level of consciousness: awake Pain management: pain level controlled Respiratory status: spontaneous breathing Cardiovascular status: stable Anesthetic complications: no       Last Vitals:  Vitals:   01/20/17 1500 01/20/17 1515  BP: 119/86 136/75  Pulse: 75 73  Resp: 17 19  Temp:      Last Pain:  Vitals:   01/20/17 1515  TempSrc:   PainSc: Asleep                 Tamesha Ellerbrock

## 2017-01-20 NOTE — Op Note (Addendum)
Patient Name:           Jeremy Stevenson   Date of Surgery:        01/20/2017  Pre op Diagnosis:      Neoplastic mass of distal descending colon   Post op Diagnosis:    Neoplastic mass of mid  descending colon                                      Omental nodule, fat necrosis by frozen section  Procedure:                Laparoscopic-assisted, hand-assisted left colectomy                                     Takedown splenic flexure                                      Excisional biopsy of lower peritoneal nodule                                      Repair umbilical hernia  Surgeon:                     Edsel Petrin. Dalbert Batman, M.D., FACS  Assistant:                    Gurney Maxin, M.D.   Indication for Assistant: Complex exposure, morbidly obese patient.  Surgical assistant indicated to reduce probability of intraoperative and postoperative complications  Operative Indications:   This is a 66 year old man, referred by Dr. Laural Golden in [facility] for a neoplastic mass of the proximal sigmoid colon. Dr. Manuella Ghazi is his PCP in [place].       He has no prior history of intestinal disease. He knows he has a small umbilical hernia. He recently saw some blood in his stool on 2 separate occasions. This has all been within the last month. He denies abdominal pain, cramps, diarrhea. He says his bowel movements are normal in texture and caliber. He was noted to have Hemoccult positive stools and a hemoglobin of 11.4.      Colonoscopy was performed on January 02, 2017. There was a polypoid mass in the proximal sigmoid colon and and a tiny hyperplastic polyp at the rectosigmoid junction. They were unable to pass the scope beyond the mass. Biopsies just showed necrotic tissue. CT scan shows an 8 cm mass in the proximal sigmoid at the sigmoid descending junction. The question whether this might be intussuscepting but there was no obstruction or dilatation of the small bowel or large bowel. There was  nonspecific portal hepatitis lymphadenopathy. Some atherosclerotic changes in the aortic vessels but no aneurysm. BPH noted. Fat containing umbilical and inguinal hernias. CEA 27.       Past history reveals that one doctor told him he has sleep apnea but no devices. Hernia repair as an infant. Elevated PSA. BMI 38. Former smoker. Hypertension.  family history is negative for colorectal cancer. Father died of myocardial infarction. Mother had breast cancer. Sister had ovarian cancer       We had a long talk. I  let him know that although the biopsy does not show cancer, that this should be treated as a cancer until proven otherwise. Because of the partially obstructing nature of this I advised double dose stool softeners twice a day and a soft diet. I advised expediting his surgery in hopes of a single-stage resection. He agrees.       He will be scheduled for laparoscopic-assisted left colectomy, possible open colectomy. He will receive mechanical and antibiotic bowel prep preop.  He agrees with this plan.  Operative Findings:       There was a 2 cm.  firm peritoneal nodule in the lower midline attached to a small tongue of omentum.  This was excised and frozen section diagnosis showed necrotic tissue probable fat necrosis.  This was consistent with gross findings.  There was a hard 8 cm rubbery mass in the mid to proximal descending colon.  There was no obvious invasion of the serosa.  This necessitated extensive mobilization of the splenic flexure, transverse colon, descending colon and sigmoid colon.  Dissection was tedious due to large body habitus.  We were able to get wide margins on either side.  There was no peritoneal or mesenteric adenopathy.  The liver felt normal.  Anastomosis was created with the GIA and TA60 stapling devices.  Procedure in Detail:          Following the induction of general endotracheal anesthesia Foley catheter was placed.  Intravenous antibiotics were given.   The abdomen was prepped and draped in a sterile fashion.  Surgical timeout was performed.      A 5 mm optical trocar was placed in the left subcostal region without difficulty.  Pneumoperitoneum was created.  Video camera was inserted there was no evidence of bleeding or injury.  5 mm trochars were placed in the left abdomen, right abdomen, and midline above and below the umbilicus.        here was a hard nodule in the lower midline attached to the peritoneal surface and it looked like a area of fat necrosis attached to the omentum.  This was removed and frozen section suggested fat necrosis and no tumor was seen.        We mobilized the distal descending colon and sigmoid colon by dividing the lateral peritoneal attachments.  Ultimately we were able to palpate the ureter on the left side and it did not appear to be injured.  We had spent a long time mobilizing the proximal descending colon where the tumor was and to mobilize the splenic flexure off of the spleen.  We divided the gastrocolic omentum close to the colon and mobilize that extensively.  We made a midline incision placed a hand port to facilitate this.  Ultimately we had everything very well mobilized.      We took the hand port out and extended the incision above and below the umbilicus a bit.  He was noted to have an umbilical hernia which we repaired later.  Self retaining retractors were placed.  We did further mobilization of the splenic flexure and distal colon until we had the entire colon up into the wound.  We transected the transverse colon just to the right of the midline preserving the middle colic vessels.  Mesenteric vessels were taken down with the LigaSure device.  A couple of larger vessels were divided and clamped and ligated with 2-0 suture ligatures of 2-0 silk.  We transected the left colon at the junction of the sigmoid colon  and descending colon.  We marked the distal margin with a suture and sent that to the lab.  An  anastomosis was created between the mid transverse colon and sigmoid colon using a GIA stapling device.  The colon looked pink and healthy on both sides.  Common defect was closed with a TA 60 stapling device.  Multiple silk sutures were placed to reinforce the staple lines at critical points.  He could palpate the lumen and it was greater than 2 fingerbreadths.  Everything looked clean.  We copiously irrigated the left upper quadrant left paracolic gutter pelvis and to a little extent the right side.  At the end of the case it appeared that we had good hemostasis.      At this point we changed over gowns gloves instruments and drapes.  We checked for bleeding and really didn't see anything else.  The omentum was returned to its anatomic position.  The midline fascia was closed with a running suture of #1, double-stranded PDS.  We undermined and dissected the umbilicus off of the hernia defect and made sure that we close the fascia carefully.  7 or 8 interrupted sutures of #1 Novafil were used to reinforce the closure.  The wound was irrigated.  The skin was closed loosely with staples and Telfa wicks were placed between the staples.  The remaining port sites were closed with staples.  Protocol honeycomb bandage was placed.  The patient tolerated the procedure well was taken to PACU in stable condition.        EBL 400 mL.  Counts correct.  Complications none.     Edsel Petrin. Dalbert Batman, M.D., FACS General and Minimally Invasive Surgery Breast and Colorectal Surgery  01/20/2017 2:44 PM

## 2017-01-21 LAB — CBC
HCT: 27.1 % — ABNORMAL LOW (ref 39.0–52.0)
HCT: 29.4 % — ABNORMAL LOW (ref 39.0–52.0)
Hemoglobin: 8.6 g/dL — ABNORMAL LOW (ref 13.0–17.0)
Hemoglobin: 9.6 g/dL — ABNORMAL LOW (ref 13.0–17.0)
MCH: 26.3 pg (ref 26.0–34.0)
MCH: 27.1 pg (ref 26.0–34.0)
MCHC: 31.7 g/dL (ref 30.0–36.0)
MCHC: 32.7 g/dL (ref 30.0–36.0)
MCV: 82.9 fL (ref 78.0–100.0)
MCV: 83.1 fL (ref 78.0–100.0)
Platelets: 223 10*3/uL (ref 150–400)
Platelets: 235 10*3/uL (ref 150–400)
RBC: 3.27 MIL/uL — ABNORMAL LOW (ref 4.22–5.81)
RBC: 3.54 MIL/uL — ABNORMAL LOW (ref 4.22–5.81)
RDW: 14.8 % (ref 11.5–15.5)
RDW: 14.9 % (ref 11.5–15.5)
WBC: 18 10*3/uL — ABNORMAL HIGH (ref 4.0–10.5)
WBC: 19.3 10*3/uL — ABNORMAL HIGH (ref 4.0–10.5)

## 2017-01-21 LAB — BASIC METABOLIC PANEL
Anion gap: 8 (ref 5–15)
BUN: 20 mg/dL (ref 6–20)
CO2: 29 mmol/L (ref 22–32)
Calcium: 8.7 mg/dL — ABNORMAL LOW (ref 8.9–10.3)
Chloride: 101 mmol/L (ref 101–111)
Creatinine, Ser: 1.34 mg/dL — ABNORMAL HIGH (ref 0.61–1.24)
GFR calc Af Amer: >60 mL/min (ref >60)
GFR calc non Af Amer: 55 mL/min — ABNORMAL LOW (ref >60)
Glucose, Bld: 204 mg/dL — ABNORMAL HIGH (ref 65–99)
Potassium: 3.6 mmol/L (ref 3.5–5.1)
Sodium: 138 mmol/L (ref 135–145)

## 2017-01-21 MED ORDER — ZOLPIDEM TARTRATE 5 MG PO TABS
5.0000 mg | ORAL_TABLET | Freq: Every evening | ORAL | Status: DC | PRN
Start: 2017-01-21 — End: 2017-01-24

## 2017-01-21 NOTE — Evaluation (Signed)
Physical Therapy Evaluation Patient Details Name: Jeremy Stevenson MRN: [id number] DOB: 1950-12-31 Today's Date: 01/21/2017   History of Present Illness  Pt admitted with neoplasm of descending colon and is s/p laparoscopic sigmoid colectomy  Clinical Impression  Pt admitted as above and presenting with functional mobility limitations 2* post op pain and mild ambulatory balance deficits.  Pt should progress to dc home with family assist.    Follow Up Recommendations No PT follow up    Equipment Recommendations  None recommended by PT    Recommendations for Other Services       Precautions / Restrictions Precautions Precautions: Fall Restrictions Weight Bearing Restrictions: No      Mobility  Bed Mobility Overal bed mobility: Needs Assistance Bed Mobility: Rolling;Sidelying to Sit Rolling: Min guard Sidelying to sit: Min assist;Mod assist       General bed mobility comments: cues for log roll technique and physical assist to move to sitting from side ly  Transfers Overall transfer level: Needs assistance Equipment used: Rolling walker (2 wheeled) Transfers: Sit to/from Stand Sit to Stand: Min assist;From elevated surface         General transfer comment: cues for transition position and use of UEs to self assist  Ambulation/Gait Ambulation/Gait assistance: Min assist Ambulation Distance (Feet): 550 Feet Assistive device: Rolling walker (2 wheeled) Gait Pattern/deviations: Step-through pattern;Decreased step length - right;Decreased step length - left;Shuffle;Trunk flexed   Gait velocity interpretation: Below normal speed for age/gender General Gait Details: cues for posture and position from RW.  Multiple short standing rests to complete task.  Stairs            Wheelchair Mobility    Modified Rankin (Stroke Patients Only)       Balance Overall balance assessment: Needs assistance Sitting-balance support: Bilateral upper extremity supported;Feet  supported Sitting balance-Leahy Scale: Fair Sitting balance - Comments: leaning back on arms 2* abdominal discomfort Postural control: Posterior lean Standing balance support: No upper extremity supported Standing balance-Leahy Scale: Fair                               Pertinent Vitals/Pain Pain Assessment: 0-10 Pain Score: 8  Pain Location: lower abdomen Pain Descriptors / Indicators: Sore;Pressure;Aching Pain Intervention(s): Limited activity within patient's tolerance;Monitored during session;Premedicated before session;Patient requesting pain meds-RN notified    Home Living Family/patient expects to be discharged to:: Private residence Living Arrangements: Spouse/significant other Available Help at Discharge: Family Type of Home: House Home Access: Stairs to enter Entrance Stairs-Rails: Right Entrance Stairs-Number of Steps: 2 Home Layout: Able to live on main level with bedroom/bathroom Home Equipment: Walker - 2 wheels;Cane - single point      Prior Function Level of Independence: Independent               Hand Dominance        Extremity/Trunk Assessment   Upper Extremity Assessment Upper Extremity Assessment: Overall WFL for tasks assessed    Lower Extremity Assessment Lower Extremity Assessment: Overall WFL for tasks assessed       Communication   Communication: No difficulties  Cognition Arousal/Alertness: Awake/alert Behavior During Therapy: WFL for tasks assessed/performed Overall Cognitive Status: Within Functional Limits for tasks assessed                      General Comments      Exercises     Assessment/Plan    PT Assessment Patient needs  continued PT services  PT Problem List Decreased activity tolerance;Decreased balance;Decreased mobility;Decreased knowledge of use of DME;Pain;Obesity       PT Treatment Interventions DME instruction;Gait training;Stair training;Functional mobility training;Therapeutic  activities;Therapeutic exercise;Patient/family education    PT Goals (Current goals can be found in the Care Plan section)  Acute Rehab PT Goals Patient Stated Goal: Regain IND PT Goal Formulation: With patient Time For Goal Achievement: 02/04/17 Potential to Achieve Goals: Good    Frequency Min 3X/week   Barriers to discharge        Co-evaluation               End of Session   Activity Tolerance: Patient tolerated treatment well Patient left: in chair;with call bell/phone within reach;with family/visitor present;with nursing/sitter in room Nurse Communication: Mobility status PT Visit Diagnosis: Unsteadiness on feet (R26.81)         Time: 6979-4801 PT Time Calculation (min) (ACUTE ONLY): 34 min   Charges:   PT Evaluation $PT Eval Low Complexity: 1 Procedure PT Treatments $Gait Training: 8-22 mins   PT G Codes:         Maiyah Goyne 01/21/2017, 11:05 AM

## 2017-01-21 NOTE — Progress Notes (Addendum)
1 Day Post-Op  Subjective: Alert and stable.  Looks pretty good.  Heart rate 75.  BP 129/76.  Afebrile.  SPO2 95% on nasal oxygen. Has actually ambulated in hall No shortness of breath.  No nausea. Excellent urine output. Morning lab work reveals hemoglobin 9.6.  WBC 19,000.  Potassium 3.6.  Glucose 204.  Creatinine 1.34.  ( Creatinine was 1.00 preop, 1.63 last night down to 1.34 this morning.  Cause of this is not clear. )  Operative findings discussed with patient   Objective: Vital signs in last 24 hours: Temp:  [97.4 F (36.3 C)-98.4 F (36.9 C)] 97.5 F (36.4 C) (03/07 0548) Pulse Rate:  [70-93] 75 (03/07 0548) Resp:  [12-28] 16 (03/07 0548) BP: (119-160)/(74-97) 129/76 (03/07 0548) SpO2:  [95 %-100 %] 95 % (03/07 0548) Weight:  [135.2 kg (298 lb)] 135.2 kg (298 lb) (03/06 0841) Last BM Date: 01/20/17  Intake/Output from previous day: 03/06 0701 - 03/07 0700 In: 5745.8 [I.V.:5245.8; IV Piggyback:500] Out: 1850 [Urine:1550; Blood:300] Intake/Output this shift: Total I/O In: 1000 [I.V.:1000] Out: 1150 [Urine:1150]    EXAM: General appearance: Alert and oriented.  Almost no distress.  Very good spirits.  Wife  present. Resp: clear to auscultation bilaterally GI: Large abdomen.  Minimal bowel sounds.  Soft.  Not distended.  Old blood on dressing but not much.  Lab Results:  Results for orders placed or performed during the hospital encounter of 01/20/17 (from the past 24 hour(s))  CBC     Status: Abnormal   Collection Time: 01/20/17  3:27 PM  Result Value Ref Range   WBC 20.6 (H) 4.0 - 10.5 K/uL   RBC 3.95 (L) 4.22 - 5.81 MIL/uL   Hemoglobin 10.5 (L) 13.0 - 17.0 g/dL   HCT 32.8 (L) 39.0 - 52.0 %   MCV 83.0 78.0 - 100.0 fL   MCH 26.6 26.0 - 34.0 pg   MCHC 32.0 30.0 - 36.0 g/dL   RDW 14.6 11.5 - 15.5 %   Platelets 234 150 - 400 K/uL  Creatinine, serum     Status: Abnormal   Collection Time: 01/20/17  3:27 PM  Result Value Ref Range   Creatinine, Ser 1.63 (H)  0.61 - 1.24 mg/dL   GFR calc non Af Amer 43 (L) >60 mL/min   GFR calc Af Amer 50 (L) >60 mL/min  Basic metabolic panel     Status: Abnormal   Collection Time: 01/21/17  5:07 AM  Result Value Ref Range   Sodium 138 135 - 145 mmol/L   Potassium 3.6 3.5 - 5.1 mmol/L   Chloride 101 101 - 111 mmol/L   CO2 29 22 - 32 mmol/L   Glucose, Bld 204 (H) 65 - 99 mg/dL   BUN 20 6 - 20 mg/dL   Creatinine, Ser 1.34 (H) 0.61 - 1.24 mg/dL   Calcium 8.7 (L) 8.9 - 10.3 mg/dL   GFR calc non Af Amer 55 (L) >60 mL/min   GFR calc Af Amer >60 >60 mL/min   Anion gap 8 5 - 15  CBC     Status: Abnormal   Collection Time: 01/21/17  5:07 AM  Result Value Ref Range   WBC 19.3 (H) 4.0 - 10.5 K/uL   RBC 3.54 (L) 4.22 - 5.81 MIL/uL   Hemoglobin 9.6 (L) 13.0 - 17.0 g/dL   HCT 29.4 (L) 39.0 - 52.0 %   MCV 83.1 78.0 - 100.0 fL   MCH 27.1 26.0 - 34.0 pg   MCHC 32.7  30.0 - 36.0 g/dL   RDW 14.8 11.5 - 15.5 %   Platelets 235 150 - 400 K/uL     Studies/Results: No results found.  Marland Kitchen alvimopan  12 mg Oral BID  . amLODipine  5 mg Oral QHS  . enoxaparin (LOVENOX) injection  40 mg Subcutaneous Q24H  . losartan  100 mg Oral QHS  . metoprolol  100 mg Oral BID  . pantoprazole (PROTONIX) IV  40 mg Intravenous Q24H     Assessment/Plan: s/p Procedure(s): LAPAROSCOPIC LEFT COLECTOMY, TAKEDOWN SPLENIC FLEXURE, AND BIOPSY OF PERITONEAL NODULE  POD #1.  Left colectomy with takedown splenic flexure, biopsy peritoneal nodule and umbilical hernia repair  Stable  Nothing by mouth except ltd. Clear liqs.  Go a little slow on diet due to prolonged handling of GI tract in surgery yesterday. entereg Ambulate  Incentive spirometry  Remove Foley tomorrow Allow Ambien at bedtime as needed for sleep Await pathology   Hypertension.  Controlled at present.  Continue Cozaar, Norvasc and Lopressor.  Discontinue Hygroton  Check creatinine tomorrow   Creatinine elevation.  Mild.  Significance unknown  Continue hydration  Labs  tomorrow   DVT prophylaxis.  Begin Lovenox this morning.  SCDs.    BMI 38   @PROBHOSP @  LOS: 1 day    Jeremy Stevenson M 01/21/2017  . .prob

## 2017-01-22 LAB — BASIC METABOLIC PANEL
Anion gap: 6 (ref 5–15)
BUN: 20 mg/dL (ref 6–20)
CO2: 28 mmol/L (ref 22–32)
Calcium: 8.8 mg/dL — ABNORMAL LOW (ref 8.9–10.3)
Chloride: 103 mmol/L (ref 101–111)
Creatinine, Ser: 1.09 mg/dL (ref 0.61–1.24)
GFR calc Af Amer: >60 mL/min (ref >60)
GFR calc non Af Amer: >60 mL/min (ref >60)
Glucose, Bld: 140 mg/dL — ABNORMAL HIGH (ref 65–99)
Potassium: 3.4 mmol/L — ABNORMAL LOW (ref 3.5–5.1)
Sodium: 137 mmol/L (ref 135–145)

## 2017-01-22 MED ORDER — LACTATED RINGERS IV SOLN
INTRAVENOUS | Status: DC
  Administered 2017-01-22 – 2017-01-24 (×4): via INTRAVENOUS
  Filled 2017-01-22 (×7): qty 1000

## 2017-01-22 MED ORDER — BISACODYL 10 MG RE SUPP: 10.0000 mg | Freq: Once | RECTAL | Status: DC

## 2017-01-22 MED ORDER — HYDROCODONE-ACETAMINOPHEN 7.5-325 MG PO TABS
1.0000 | ORAL_TABLET | ORAL | Status: DC | PRN
  Administered 2017-01-22: 2 via ORAL
  Administered 2017-01-22 (×2): 1 via ORAL
  Administered 2017-01-23 – 2017-01-24 (×3): 2 via ORAL
  Filled 2017-01-22: qty 1
  Filled 2017-01-22 (×4): qty 2
  Filled 2017-01-22: qty 1

## 2017-01-22 NOTE — Progress Notes (Signed)
2 Days Post-Op  Subjective: Had some cramps and a little bit of nausea last night but has passed flatus this morning and now feels good without pain or nausea. Ambulating in halls. No respiratory complaints. Afebrile.  Heart rate 75.  Borderline hypertension Good urine output Creatinine back down to 1.09.  BUN 20.  Potassium 3.4.  Glucose 140. Hemoglobin 8.6 last night.  This is appropriate considering hydration and blood loss at surgery WBC 18,000, suspect this is reactive.  Objective: Vital signs in last 24 hours: Temp:  [98.2 F (36.8 C)-99.6 F (37.6 C)] 98.4 F (36.9 C) (03/08 0600) Pulse Rate:  [70-86] 75 (03/08 0600) Resp:  [16-19] 16 (03/08 0600) BP: (134-163)/(81-89) 163/86 (03/08 0600) SpO2:  [93 %-95 %] 93 % (03/08 0600) Last BM Date: 01/20/17  Intake/Output from previous day: 03/07 0701 - 03/08 0700 In: 3439.2 [P.O.:240; I.V.:3199.2] Out: 1600 [Urine:1600] Intake/Output this shift: No intake/output data recorded.   EXAM: General appearance: Alert.  Oriented.  Cooperative.  Mental status normal.  Minimal distress. Resp: Clear to auscultation bilaterally.  Vital capacity 1700 mL. GI: Soft.  Hypoactive bowel sounds.  A little tympanitic and a little tender in epigastrium but fairly soft and benign elsewhere.  Incisions clean and dry.  All a little bit of old dried blood under honeycomb bandage. Extremities: no edema, redness or tenderness in the calves or thighs  Lab Results:  Results for orders placed or performed during the hospital encounter of 01/20/17 (from the past 24 hour(s))  CBC     Status: Abnormal   Collection Time: 01/21/17 11:36 PM  Result Value Ref Range   WBC 18.0 (H) 4.0 - 10.5 K/uL   RBC 3.27 (L) 4.22 - 5.81 MIL/uL   Hemoglobin 8.6 (L) 13.0 - 17.0 g/dL   HCT 27.1 (L) 39.0 - 52.0 %   MCV 82.9 78.0 - 100.0 fL   MCH 26.3 26.0 - 34.0 pg   MCHC 31.7 30.0 - 36.0 g/dL   RDW 14.9 11.5 - 15.5 %   Platelets 223 150 - 400 K/uL  Basic metabolic panel      Status: Abnormal   Collection Time: 01/22/17  5:07 AM  Result Value Ref Range   Sodium 137 135 - 145 mmol/L   Potassium 3.4 (L) 3.5 - 5.1 mmol/L   Chloride 103 101 - 111 mmol/L   CO2 28 22 - 32 mmol/L   Glucose, Bld 140 (H) 65 - 99 mg/dL   BUN 20 6 - 20 mg/dL   Creatinine, Ser 1.09 0.61 - 1.24 mg/dL   Calcium 8.8 (L) 8.9 - 10.3 mg/dL   GFR calc non Af Amer >60 >60 mL/min   GFR calc Af Amer >60 >60 mL/min   Anion gap 6 5 - 15     Studies/Results: No results found.  Marland Kitchen alvimopan  12 mg Oral BID  . amLODipine  5 mg Oral QHS  . bisacodyl  10 mg Rectal Once  . enoxaparin (LOVENOX) injection  40 mg Subcutaneous Q24H  . losartan  100 mg Oral QHS  . metoprolol  100 mg Oral BID  . pantoprazole (PROTONIX) IV  40 mg Intravenous Q24H     Assessment/Plan: s/p Procedure(s): LAPAROSCOPIC LEFT COLECTOMY, TAKEDOWN SPLENIC FLEXURE, AND BIOPSY OF PERITONEAL NODULE  POD #2.  Left colectomy with takedown splenic flexure, biopsy peritoneal nodule and umbilical hernia repair  Stable  Clear liquids ad lib. expected ileus not resolved. Duphalac suppository entereg Ambulate  Incentive spirometry  Foley to be removed  this morning. Allow Ambien at bedtime as needed for sleep Await pathology, anticipate this will be completed by tomorrow morning.  Hypertension.  Controlled at present.  Continue Cozaar, Norvasc and Lopressor.   holding  Hygroton   Creatinine elevation.  Mild.   essentially resolved.  DVT prophylaxis.  Lovenox and.  SCDs.     @PROBHOSP @  LOS: 2 days    Shellie Goettl M 01/22/2017  . .prob

## 2017-01-22 NOTE — Addendum Note (Signed)
Addendum  created 01/22/17 1035 by Belinda Block, MD   Anesthesia Attestations filed

## 2017-01-22 NOTE — Progress Notes (Signed)
  Physical Therapy Treatment and d/c from acute PT Patient Details Name: Jeremy Stevenson MRN: [id number] DOB: 06-06-1951 Today's Date: 01/22/2017    History of Present Illness Pt admitted with neoplasm of descending colon and is s/p laparoscopic sigmoid colectomy    PT Comments    Pt mobilizing very well and reports ambulating a few times today already.  Pt currently supervision level and has met acute PT goals.  Pt agreeable to d/c from acute PT at this time.  Pt encouraged and agreeable to ambulate at least 5x daily during acute stay.  PT to sign off, please reorder if new needs arise.   Follow Up Recommendations  No PT follow up     Equipment Recommendations  None recommended by PT    Recommendations for Other Services       Precautions / Restrictions Precautions Precautions: None    Mobility  Bed Mobility               General bed mobility comments: up in recliner on arrival  Transfers Overall transfer level: Needs assistance Equipment used: None Transfers: Sit to/from Stand Sit to Stand: Supervision         General transfer comment: supervision only due to unlocked recliner, pt agreeable to keep recliner locked for safety now  Ambulation/Gait Ambulation/Gait assistance: Supervision Ambulation Distance (Feet): 400 Feet Assistive device: None Gait Pattern/deviations: Decreased stride length;Step-through pattern     General Gait Details: steady, pushed IV pole, slow pace   Stairs            Wheelchair Mobility    Modified Rankin (Stroke Patients Only)       Balance                                    Cognition Arousal/Alertness: Awake/alert Behavior During Therapy: WFL for tasks assessed/performed Overall Cognitive Status: Within Functional Limits for tasks assessed                      Exercises      General Comments        Pertinent Vitals/Pain Pain Assessment: 0-10 Pain Score: 5  Pain Location: lower  abdomen Pain Descriptors / Indicators: Sore;Aching Pain Intervention(s): Monitored during session;Limited activity within patient's tolerance;Repositioned    Home Living                      Prior Function            PT Goals (current goals can now be found in the care plan section) Progress towards PT goals: Goals met/education completed, patient discharged from PT    Frequency           PT Plan Other (comment) (d/c from acute PT)    Co-evaluation             End of Session   Activity Tolerance: Patient tolerated treatment well Patient left: in chair;with call bell/phone within reach;with family/visitor present;with nursing/sitter in room         Time: 1540-1551 PT Time Calculation (min) (ACUTE ONLY): 11 min  Charges:  $Gait Training: 8-22 mins                    G Codes:       Rollin Kotowski,KATHrine E 01/22/2017, 3:59 PM Carmelia Bake, PT, DPT 01/22/2017 Pager: [phone]

## 2017-01-23 MED ORDER — CHLORTHALIDONE 25 MG PO TABS
25.0000 mg | ORAL_TABLET | Freq: Every day | ORAL | Status: DC
  Administered 2017-01-23 – 2017-01-24 (×2): 25 mg via ORAL
  Filled 2017-01-23 (×2): qty 1

## 2017-01-23 MED ORDER — PREMIER PROTEIN SHAKE
11.0000 [oz_av] | Freq: Two times a day (BID) | ORAL | Status: DC
  Administered 2017-01-23: 11 [oz_av] via ORAL

## 2017-01-23 MED ORDER — FERROUS SULFATE 325 (65 FE) MG PO TABS
325.0000 mg | ORAL_TABLET | Freq: Two times a day (BID) | ORAL | Status: DC
  Administered 2017-01-23 – 2017-01-24 (×3): 325 mg via ORAL
  Filled 2017-01-23 (×3): qty 1

## 2017-01-23 MED ORDER — PANTOPRAZOLE SODIUM 40 MG PO TBEC
80.0000 mg | DELAYED_RELEASE_TABLET | Freq: Every day | ORAL | Status: DC
  Administered 2017-01-23 – 2017-01-24 (×2): 80 mg via ORAL
  Filled 2017-01-23 (×2): qty 2

## 2017-01-23 NOTE — Progress Notes (Signed)
Pharmacy Brief Note - Alvimopan (Entereg)  The standing order set for alvimopan (Entereg) now includes an automatic order to discontinue the drug after the patient has had a bowel movement. The change was approved by the [facility] and the Medical Executive Committee.   This patient has had bowel movements documented by nursing. Therefore, alvimopan has been discontinued. If there are questions, please contact the pharmacy at [phone].   Thank you- Dolly Rias RPh 01/23/2017, 8:58 AM Pager [phone]

## 2017-01-23 NOTE — Progress Notes (Signed)
Initial Nutrition Assessment  DOCUMENTATION CODES:   Obesity unspecified  INTERVENTION:   Premier Protein BID, each supplement provides 160kcal and 30g protein.   NUTRITION DIAGNOSIS:   Increased nutrient needs related to wound healing as evidenced by increased estimated needs from protein.   GOAL:   Patient will meet greater than or equal to 90% of their needs  MONITOR:   PO intake, Supplement acceptance, Weight trends  REASON FOR ASSESSMENT:   Consult Diet education  ASSESSMENT:   66 year old man, referred by Dr. Laural Golden in [facility] for a neoplastic mass of the proximal sigmoid colon. Now s/p left colectomy with takedown splenic flexure, biopsy peritoneal nodule and umbilical hernia repair   Met with pt and pt's wife in room today. Pt reports good appetite and oral intake pta. Pt is currently eating 100% of his full liquid diet. Per chart, pt has lost 8lbs since admit. This is not significant given history. Pt complains of diarrhea today. RD educated patient regarding post op soft diet and generalized healthy diet as normal bowel function returns. RD will order Premier Protein as pt will be unable to meet protein needs on house diet.   Medications reviewed and include: dulcolax, lovenox, ferrous sulfate, protonix, hydrocodone  Labs reviewed: K 3.4(L), Ca 8.8(L) Wbc- 18(H), Hgb 8.6(L), Hct 27.1(L) cbgs- 204, 140 x 24 hrs AIC 6.5(H) 3/2  Nutrition-Focused physical exam completed. Findings are no fat depletion, no muscle depletion, and no edema.   Diet Order:  Diet full liquid Room service appropriate? Yes; Fluid consistency: Thin  Skin:  Wound (see comment) (abdominal incision )  Last BM:  3/9  Height:   Ht Readings from Last 1 Encounters:  01/20/17 _0  (1.88 m)    Weight:   Wt Readings from Last 1 Encounters:  01/23/17 290 lb 12.8 oz (131.9 kg)    Ideal Body Weight:  86.3 kg  BMI:  Body mass index is 37.34 kg/m.  Estimated Nutritional Needs:    Kcal:  2400-2700kcal/day   Protein:  132-145g/day   Fluid:  <2.5L/day  EDUCATION NEEDS:   Education needs addressed  Koleen Distance, RD, LDN Pager #- [phone]

## 2017-01-23 NOTE — Progress Notes (Signed)
3 Days Post-Op  Subjective: Clinically improving.  Afebrile.  BP elevated intermittently but not sustained.  Heart rate 70. Has had several loose stools.  Frustrated because of a couple of episodes of incontinence. Voiding without difficulty Minimal pain No nausea.  Tolerating clear liquids Hemoglobin 8.6.  To start iron tablets today.  Pathology report shows adenocarcinoma of the descending colon, 6.8 cm diameter.  Peritoneal nodule shows benign fat necrosis.  20 lymph nodes examined and all 20 negative.  Stage T3, N0. I discussed pathology report patient and family. Plan referral to medical oncology.  He would like to be seen in taking care of by medical oncology here in [place].  I will arrange that at the time of discharge.   Objective: Vital signs in last 24 hours: Temp:  [97.6 F (36.4 C)-98.5 F (36.9 C)] 97.6 F (36.4 C) (03/09 0548) Pulse Rate:  [70-92] 70 (03/09 0548) Resp:  [16-20] 20 (03/08 2245) BP: (135-170)/(81-88) 170/88 (03/09 0548) SpO2:  [92 %-98 %] 97 % (03/09 0548) Last BM Date: 01/22/17  Intake/Output from previous day: 03/08 0701 - 03/09 0700 In: 2408.3 [P.O.:660; I.V.:1748.3] Out: 1077 [Urine:1075; Stool:2] Intake/Output this shift: Total I/O In: 1380 [P.O.:180; I.V.:1200] Out: 575 [Urine:575]    EXAM: General appearance: Alert.  Oriented.  Cooperative.  Mental status normal.  Minimal distress. Resp: Clear to auscultation bilaterally.  Vital capacity 1800 mL. GI: Soft.  Hypoactive bowel sounds.  A little tympanitic and a little tender in epigastrium but fairly soft and benign elsewhere.  Incisions clean and dry.  I changes all of the bandage and removed the Telfa wicks from the midline.  Wounds look good.  No infection Extremities: no edema, redness or tenderness in the calves or thighs   Lab Results:  No results found for this or any previous visit (from the past 24 hour(s)).   Studies/Results: No results found.  Marland Kitchen alvimopan  12 mg Oral  BID  . amLODipine  5 mg Oral QHS  . bisacodyl  10 mg Rectal Once  . enoxaparin (LOVENOX) injection  40 mg Subcutaneous Q24H  . losartan  100 mg Oral QHS  . metoprolol  100 mg Oral BID  . pantoprazole  80 mg Oral Daily     Assessment/Plan: s/p Procedure(s): LAPAROSCOPIC LEFT COLECTOMY, TAKEDOWN SPLENIC FLEXURE, AND BIOPSY OF PERITONEAL NODULE   POD #3.Left colectomy with takedown splenic flexure, biopsy peritoneal nodule and umbilical hernia repair  Stable  Advance to full liquids now.  Possibly advance to soft diet at supper Allow Ambien at bedtime as needed for sleep Discharge in 24-48 hours if he makes good progress  Adenocarcinoma of descending colon.  T3 N0.  Discussed with patient and wife in detail.  Plan outpatient medical oncology referral at [facility] in [place]  Hypertension. Controlled at present. Continue Cozaar, Norvasc and Lopressor.   restarted hygroton today  Anemia.  Combined acute and chronic blood loss.   Check labs tomorrow.  Started FeSO4 today  Creatinine elevation. Mild.  essentially resolved.  DVT prophylaxis. Lovenox and. SCDs.   @PROBHOSP @  LOS: 3 days    Tammye Kahler M 01/23/2017  . .prob

## 2017-01-23 NOTE — Progress Notes (Signed)
Nutrition Education Note  RD consulted for nutrition education regarding a post op low residue diet and general heathy diet as bowel function returns to normal.   RD provided "Low Fiber Nutrition Therapy" handout from the Academy of [employer] and Dietetics. Reviewed patient's dietary recall. Provided examples on ways to decrease fiber intake in the diet such as avoiding whole grains, seeds, nuts, raw vegetables,  fruits with skin, and connective tissue of meats. Advised patient to avoid sugar, artificial sweeteners, acidic/spicy foods, and caffeine. Discussed with patient the importance of having adequate amounts of lean protein and avoiding saturated fats. Gave patient examples of meals and menu planning.   RD discussed with pt how to incorporate fiber back into his diet slowly as bowel function returns. RD discussed a general healthy diet including increased fruits and vegetables, increased whole grains, increased lean proteins, and decreased saturated fats, sodium, and empty calorie foods.    Teach back method used.  Expect good compliance.  Body mass index is 37.34 kg/m. Pt meets criteria for obese based on current BMI.  Current diet order is full liquid, patient is consuming approximately 100% of meals at this time. Labs and medications reviewed.   RD following this patient  Koleen Distance, RD, LDN Pager #[phone]

## 2017-01-23 NOTE — Discharge Instructions (Signed)
[facility], [place] [phone]  OPEN ABDOMINAL SURGERY: POST OP INSTRUCTIONS  Always review your discharge instruction sheet given to you by the facility where your surgery was performed.  IF YOU HAVE DISABILITY OR FAMILY LEAVE FORMS, YOU MUST BRING THEM TO THE OFFICE FOR PROCESSING.  PLEASE DO NOT GIVE THEM TO YOUR DOCTOR.  1. A prescription for pain medication may be given to you upon discharge.  Take your pain medication as prescribed, if needed.  If narcotic pain medicine is not needed, then you may take acetaminophen (Tylenol) or ibuprofen (Advil) as needed. 2. Take your usually prescribed medications unless otherwise directed. 3. If you need a refill on your pain medication, please contact your pharmacy. They will contact our office to request authorization.  Prescriptions will not be filled after 5pm or on week-ends. 4. You should follow a light diet the first few days after arrival home, such as soup and crackers, pudding, etc.unless your doctor has advised otherwise. A high-fiber, low fat diet can be resumed as tolerated.   Be sure to include lots of fluids daily. Most patients will experience some swelling and bruising on the chest and neck area.  Ice packs will help.  Swelling and bruising can take several days to resolve 5. Most patients will experience some swelling and bruising in the area of the incision. Ice pack will help. Swelling and bruising can take several days to resolve..  6. It is common to experience some constipation if taking pain medication after surgery.  Increasing fluid intake and taking a stool softener will usually help or prevent this problem from occurring.  A mild laxative (Milk of Magnesia or Miralax) should be taken according to package directions if there are no bowel movements after 48 hours. 7.  You may have steri-strips (small skin tapes) in place directly over the incision.  These strips should be left on the skin for 7-10 days.  If your  surgeon used skin glue on the incision, you may shower in 24 hours.  The glue will flake off over the next 2-3 weeks.  Any sutures or staples will be removed at the office during your follow-up visit. You may find that a light gauze bandage over your incision may keep your staples from being rubbed or pulled. You may shower and replace the bandage daily. 8. ACTIVITIES:  You may resume regular (light) daily activities beginning the next day--such as daily self-care, walking, climbing stairs--gradually increasing activities as tolerated.  You may have sexual intercourse when it is comfortable.  Refrain from any heavy lifting or straining until approved by your doctor. a. You may drive when you no longer are taking prescription pain medication, you can comfortably wear a seatbelt, and you can safely maneuver your car and apply brakes b. Return to Work: ___________________________________ 4. You should see your doctor in the office for a follow-up appointment approximately two weeks after your surgery.  Make sure that you call for this appointment within a day or two after you arrive home to insure a convenient appointment time. OTHER INSTRUCTIONS:                  no sports or lifting over 15 pounds for 6 weeks                 No driving for 6-81 days                  Walk a lot  ______________________________________________________  WHEN TO CALL YOUR DOCTOR: 1. Fever over 101.0 2. Inability to urinate 3. Nausea and/or vomiting 4. Extreme swelling or bruising 5. Continued bleeding from incision. 6. Increased pain, redness, or drainage from the incision. 7. Difficulty swallowing or breathing 8. Muscle cramping or spasms. 9. Numbness or tingling in hands or feet or around lips.  The clinic staff is available to answer your questions during regular business hours.  Please dont hesitate to call and ask to speak to one of the nurses if you have concerns.  For further questions, please visit  [email or web address]

## 2017-01-24 LAB — BASIC METABOLIC PANEL
Anion gap: 6 (ref 5–15)
BUN: 14 mg/dL (ref 6–20)
CO2: 29 mmol/L (ref 22–32)
Calcium: 8.9 mg/dL (ref 8.9–10.3)
Chloride: 101 mmol/L (ref 101–111)
Creatinine, Ser: 1.03 mg/dL (ref 0.61–1.24)
GFR calc Af Amer: >60 mL/min (ref >60)
GFR calc non Af Amer: >60 mL/min (ref >60)
Glucose, Bld: 108 mg/dL — ABNORMAL HIGH (ref 65–99)
Potassium: 3 mmol/L — ABNORMAL LOW (ref 3.5–5.1)
Sodium: 136 mmol/L (ref 135–145)

## 2017-01-24 LAB — CBC
HCT: 28.7 % — ABNORMAL LOW (ref 39.0–52.0)
Hemoglobin: 9 g/dL — ABNORMAL LOW (ref 13.0–17.0)
MCH: 25.9 pg — ABNORMAL LOW (ref 26.0–34.0)
MCHC: 31.4 g/dL (ref 30.0–36.0)
MCV: 82.7 fL (ref 78.0–100.0)
Platelets: 209 10*3/uL (ref 150–400)
RBC: 3.47 MIL/uL — ABNORMAL LOW (ref 4.22–5.81)
RDW: 15 % (ref 11.5–15.5)
WBC: 11 10*3/uL — ABNORMAL HIGH (ref 4.0–10.5)

## 2017-01-24 MED ORDER — HYDROCODONE-ACETAMINOPHEN 7.5-325 MG PO TABS: 1.0000 | ORAL_TABLET | ORAL | 0 refills | Status: DC | PRN

## 2017-01-24 MED ORDER — ONDANSETRON HCL 4 MG PO TABS: 4.0000 mg | ORAL_TABLET | Freq: Four times a day (QID) | ORAL | 0 refills | Status: DC | PRN

## 2017-01-24 NOTE — Progress Notes (Signed)
Nurse reviewed discharge instructions with pt and his wife.  Pt verbalized understanding of discharge instructions, follow up appointments, new medications and incision care.  No concerns at time of discharge.  Prescriptions given to pt prior to discharge.

## 2017-01-24 NOTE — Discharge Summary (Signed)
Physician Discharge Summary  Patient ID: Jeremy Stevenson MRN: [id number] DOB/AGE: May 23, 1951 66 y.o.  Admit date: 01/20/2017 Discharge date: 01/24/2017  Admission Diagnoses:  Left colon cancer  Discharge Diagnoses:  Same post resection  Principal Problem:   Neoplasm of uncertain behavior of descending colon   Surgery:  Lap assisted left colectomy  Discharged Condition: improved  Hospital Course:   Had surgery on Tuesday and discharged on Saturday.  Liquids begun and tolerated.  Up walking a lot and eager to go home.  Followup with office for staple removal  Consults: none  Significant Diagnostic Studies: path discussed by Dr. Dalbert Batman    Discharge Exam: Blood pressure (!) 162/59, pulse 71, temperature 98.1 F (36.7 C), temperature source Oral, resp. rate 18, height 6\' 2"  (1.88 m), weight 131.9 kg (290 lb 12.8 oz), SpO2 100 %. Incisions have staples in place.    Disposition: 01-Home or Self Care  Discharge Instructions    Call MD for:  persistant nausea and vomiting    Complete by:  As directed    Call MD for:  redness, tenderness, or signs of infection (pain, swelling, redness, odor or green/yellow discharge around incision site)    Complete by:  As directed    Diet - low sodium heart healthy    Complete by:  As directed    Discharge wound care:    Complete by:  As directed    Change dressing on incision as needed and with showers.  Staple removal in 1 week.   Increase activity slowly    Complete by:  As directed      Allergies as of 01/24/2017      Reactions   Codeine Nausea Only      Medication List    TAKE these medications   amLODipine 5 MG tablet Commonly known as:  NORVASC Take 5 mg by mouth at bedtime.   chlorthalidone 25 MG tablet Commonly known as:  HYGROTON Take 25 mg by mouth at bedtime.   citalopram 20 MG tablet Commonly known as:  CELEXA Take 20 mg by mouth at bedtime.   HYDROcodone-acetaminophen 7.5-325 MG tablet Commonly known as:  NORCO Take  1-2 tablets by mouth every 4 (four) hours as needed for moderate pain.   losartan 100 MG tablet Commonly known as:  COZAAR Take 100 mg by mouth at bedtime.   metoprolol 100 MG tablet Commonly known as:  LOPRESSOR Take 100 mg by mouth 2 (two) times daily.   metroNIDAZOLE 500 MG tablet Commonly known as:  FLAGYL Hasn't picked up yet will take prior to procedure   neomycin 500 MG tablet Commonly known as:  MYCIFRADIN Hasn't picked up yet.  Will take prior to procedure   ondansetron 4 MG tablet Commonly known as:  ZOFRAN Take 1 tablet (4 mg total) by mouth every 6 (six) hours as needed for nausea.      Follow-up Information    Adin Hector, MD. Schedule an appointment as soon as possible for a visit in 1 week(s).   Specialty:  General Surgery Why:  Make appointment in one week for staple removal Also keep appointment scheduled for April 9 Contact information: [address] [place] [id number] [phone]           Signed: Pedro Earls 01/24/2017, 9:45 AM

## 2017-02-09 ENCOUNTER — Telehealth: Payer: Self-pay | Admitting: Oncology

## 2017-02-09 NOTE — Telephone Encounter (Signed)
Tc to the pt to schedule an appt. Spoke to the pt's wife and scheduled the appt for the pt to see Dr. Benay Spice on 3/30 at [place] to arrive 30 minutes early. Voiced understanding. Letter mailed.

## 2017-02-13 ENCOUNTER — Telehealth: Payer: Self-pay | Admitting: Oncology

## 2017-02-13 ENCOUNTER — Telehealth: Payer: Self-pay | Admitting: *Deleted

## 2017-02-13 ENCOUNTER — Ambulatory Visit (HOSPITAL_BASED_OUTPATIENT_CLINIC_OR_DEPARTMENT_OTHER): Payer: Managed Care, Other (non HMO) | Admitting: Oncology

## 2017-02-13 ENCOUNTER — Ambulatory Visit (HOSPITAL_BASED_OUTPATIENT_CLINIC_OR_DEPARTMENT_OTHER): Payer: Managed Care, Other (non HMO)

## 2017-02-13 VITALS — BP 154/86 | HR 71 | Temp 98.6°F | Resp 18 | Ht 74.0 in | Wt 284.2 lb

## 2017-02-13 DIAGNOSIS — C186 Malignant neoplasm of descending colon: Secondary | ICD-10-CM | POA: Insufficient documentation

## 2017-02-13 DIAGNOSIS — F329 Major depressive disorder, single episode, unspecified: Secondary | ICD-10-CM | POA: Diagnosis not present

## 2017-02-13 DIAGNOSIS — N448 Other noninflammatory disorders of the testis: Secondary | ICD-10-CM | POA: Diagnosis not present

## 2017-02-13 DIAGNOSIS — Z8041 Family history of malignant neoplasm of ovary: Secondary | ICD-10-CM

## 2017-02-13 DIAGNOSIS — Z803 Family history of malignant neoplasm of breast: Secondary | ICD-10-CM

## 2017-02-13 DIAGNOSIS — R970 Elevated carcinoembryonic antigen [CEA]: Secondary | ICD-10-CM

## 2017-02-13 LAB — CEA (IN HOUSE-CHCC): CEA (CHCC-In House): 1.67 ng/mL (ref 0.00–5.00)

## 2017-02-13 NOTE — Telephone Encounter (Signed)
-----   Message from Ladell Pier, MD sent at 02/13/2017  4:30 PM EDT ----- Please call patient, cea is normal,f/u as scheduled

## 2017-02-13 NOTE — Progress Notes (Signed)
[facility] Patient Consult   Referring MD: Haywood Ingram   Jeremy Stevenson 66 y.o.  27-Jun-1951    Reason for Referral: Colon cancer   HPI: Jeremy Stevenson had an episode of blood per rectum and was referred to [place]. A colonoscopy on 01/02/2017 revealed a fungating polypoid lesion in the proximal sigmoid colon. No bleeding was present. A 6 mm polyp was removed from the rectosigmoid. The colonoscopy was not completed secondary to eat partially obstructing mass. The pathology revealed a hyperplastic polyp at the rectosigmoid. The biopsy from the proximal sigmoid colon mass was nondiagnostic. A CT of the abdomen and pelvis on 01/02/2017 revealed no liver mass. No pulmonary nodules.. A mass was noted at the junction of the descending and sigmoid colon with partial colonic intussusception. A 1.1 m porta hepatis node was seen. No additional adenopathy. A chest x-ray on 01/16/2017 revealed no acute cardiopulmonary disease.  He was referred to Dr. Dalbert Batman and was taken the operating room 01/20/2017 for a laparoscopic-assisted, hand-assisted left colectomy. A 2 cm peritoneal nodule was removed from the omentum. An 8 cm mass was noted in the mid to proximal descending colon. No evidence of metastatic disease. The pathology ([id number]) revealed a moderately differentiated adenocarcinoma. Tumor invaded into pericolonic tissue. The resection margins were negative. 20 lymph nodes were negative for metastatic carcinoma. The peritoneal nodule returned as fat necrosis with no malignancy. No macroscopic tumor perforation. No lymphovascular or perineural invasion. No tumor deposits. No additional polyps. The tumor returned MSI-stable with no loss of mismatch repair protein expression.  He reports developing scrotal edema following surgery. This has improved. His bowels are moving.    Past Medical History:  Diagnosis Date  . Hypertension   . Neoplasm of uncertain behavior of descending colon  01/20/2017    . Depression  Past Surgical History:  Procedure Laterality Date  . COLONOSCOPY N/A 01/02/2017   Procedure: COLONOSCOPY;  Surgeon: Rogene Houston, MD;  Location: [facility];  Service: Endoscopy;  Laterality: N/A;  1000  . HERNIA REPAIR     66 years old  . LAPAROSCOPIC SIGMOID COLECTOMY N/A 01/20/2017   Procedure: LAPAROSCOPIC LEFT COLECTOMY, TAKEDOWN SPLENIC FLEXURE, AND BIOPSY OF PERITONEAL NODULE;  Surgeon: Fanny Skates, MD;  Location: [facility];  Service: General;  Laterality: N/A;  . TONSILLECTOMY     66 years old    Medications: Reviewed  Allergies:  Allergies  Allergen Reactions  . Codeine Nausea Only    Family history: His mother had breast cancer in her 43s. His sister died of ovarian cancer at age 28. He has one daughter. No other family history of cancer.  Social History:   He lives with his wife in [place]. He works in a [employer]. He quit smoking cigarettes greater than 20 years ago. No out called use. No transfusion history. No risk factor for HIV or hepatitis.    ROS:   Positives include:1 episode of rectal bleeding prior to surgery, rash at the but not for several years, scrotal swelling following surgery  A complete ROS was otherwise negative.  Physical Exam:  Blood pressure (!) 154/86, pulse 71, temperature 98.6 F (37 C), temperature source Oral, resp. rate 18, height '6\' 2"'  (1.88 m), weight 284 lb 3.2 oz (128.9 kg), SpO2 97 %.  HEENT: Oropharynx without visible mass, neck without mass Lungs: Clear bilaterally Cardiac: Regular rate and rhythm Abdomen: No hepatosplenomegaly, no mass, the midline incision has almost completely healed with a 2 cm area of  granulation tissue at the upper aspect of the wound GU: Mild scrotal edema, the right testicle is larger than the left side-hydrocele?, No mass  Vascular: No leg edema Lymph nodes: No cervical, supraclavicular, axillary, or inguinal nodes Neurologic: Alert and oriented, the motor exam appears intact  in the upper and lower extremities Skin: Yeast type rash at the left groin and upper left gluteal fold Musculoskeletal: No spine tenderness   LAB: CEA on 01/17/1999 18-27 CEA 02/13/2017-1.67   Imaging: As per history of present illness, CT from 01/02/2017-images reviewed     Assessment/Plan:   1. Adenocarcinoma of the descending colon, stage II (T3 N0), status post a left colectomy 01/20/2017  MSI-stable, no loss of mismatch repair protein expression  Elevated preoperative CEA  Incomplete preoperative colonoscopy  2.  Enlargement of the right testicle-hydrocele?  3.  Hypertension  4.  Depression  5.  Family history of breast and ovarian cancer   Jeremy Stevenson has been diagnosed with colon cancer. I reviewed the details of the surgical pathology report with Jeremy Stevenson and his wife. He underwent a left colectomy for removal of a stage II tumor of the descending colon. The tumor does not have "high risk "features. He has a good prognosis for a long-term disease-free survival. I discussed the lack of clear data to support a benefit to adjuvant chemotherapy in this setting. I do not recommend adjuvant chemotherapy.  He will need to undergo a completion colonoscopy with Dr. Laural Golden.  We discussed diet and exercise maneuvers that may decrease the risk of developing colon cancer. We also discussed the potential role for aspirin in decreasing the risk of colon cancer and colon cancer recurrence. This is not a standard recommendation as of yet, but he could consider aspirin use if recommended for another indication by his primary physician.  I recommended he ask Dr. Dalbert Batman to examine the right testicle when he sees him next month. He will follow-up with his primary physician for the apparent [place] rash at the but not.  I referred him to the genetics counselor based on his personal and family history of cancer. He does not appear to have hereditary non-polyposis colon cancer syndrome, but  his family members are at increased risk of developing colon cancer and should receive appropriate screening.  He will return for an office visit and CEA in 6 months. I will present his case at the GI tumor conference.  50 minutes were spent with the patient today. The majority of the time was used for counseling and coordination of care.   Betsy Coder, MD  02/13/2017, 4:48 PM

## 2017-02-13 NOTE — Telephone Encounter (Signed)
Gave patient avs report and appointments for April and September

## 2017-02-16 ENCOUNTER — Telehealth: Payer: Self-pay | Admitting: *Deleted

## 2017-02-16 ENCOUNTER — Other Ambulatory Visit: Payer: Self-pay | Admitting: General Surgery

## 2017-02-16 DIAGNOSIS — N5089 Other specified disorders of the male genital organs: Secondary | ICD-10-CM

## 2017-02-16 NOTE — Telephone Encounter (Signed)
Message left for pt to call back for lab results

## 2017-02-17 ENCOUNTER — Telehealth: Payer: Self-pay | Admitting: *Deleted

## 2017-02-17 NOTE — Telephone Encounter (Signed)
Message left with patient to call [place] back for lab results.

## 2017-02-17 NOTE — Telephone Encounter (Signed)
-----   Message from Ladell Pier, MD sent at 02/13/2017  4:30 PM EDT ----- Please call patient, cea is normal,f/u as scheduled

## 2017-02-20 ENCOUNTER — Other Ambulatory Visit: Payer: Managed Care, Other (non HMO)

## 2017-02-24 ENCOUNTER — Other Ambulatory Visit (HOSPITAL_COMMUNITY): Payer: Self-pay | Admitting: General Surgery

## 2017-02-24 DIAGNOSIS — N5089 Other specified disorders of the male genital organs: Secondary | ICD-10-CM

## 2017-02-25 ENCOUNTER — Other Ambulatory Visit (HOSPITAL_COMMUNITY): Payer: Self-pay | Admitting: General Surgery

## 2017-02-26 ENCOUNTER — Ambulatory Visit (HOSPITAL_COMMUNITY)
Admission: RE | Admit: 2017-02-26 | Discharge: 2017-02-26 | Disposition: A | Discharge status: Home / Self Care | Payer: Managed Care, Other (non HMO) | Source: Ambulatory Visit | Attending: General Surgery | Admitting: General Surgery

## 2017-02-26 DIAGNOSIS — N433 Hydrocele, unspecified: Secondary | ICD-10-CM | POA: Insufficient documentation

## 2017-02-26 DIAGNOSIS — N5089 Other specified disorders of the male genital organs: Secondary | ICD-10-CM

## 2017-02-26 DIAGNOSIS — N509 Disorder of male genital organs, unspecified: Secondary | ICD-10-CM | POA: Insufficient documentation

## 2017-03-03 ENCOUNTER — Encounter: Payer: Self-pay | Admitting: Genetics

## 2017-03-03 ENCOUNTER — Other Ambulatory Visit: Payer: Managed Care, Other (non HMO)

## 2017-03-03 ENCOUNTER — Ambulatory Visit (HOSPITAL_BASED_OUTPATIENT_CLINIC_OR_DEPARTMENT_OTHER): Payer: Managed Care, Other (non HMO) | Admitting: Genetics

## 2017-03-03 DIAGNOSIS — C189 Malignant neoplasm of colon, unspecified: Secondary | ICD-10-CM | POA: Diagnosis not present

## 2017-03-03 DIAGNOSIS — Z803 Family history of malignant neoplasm of breast: Secondary | ICD-10-CM

## 2017-03-03 DIAGNOSIS — C186 Malignant neoplasm of descending colon: Secondary | ICD-10-CM

## 2017-03-03 DIAGNOSIS — Z315 Encounter for genetic counseling: Secondary | ICD-10-CM | POA: Diagnosis not present

## 2017-03-03 DIAGNOSIS — Z8041 Family history of malignant neoplasm of ovary: Secondary | ICD-10-CM | POA: Diagnosis not present

## 2017-03-03 NOTE — Progress Notes (Signed)
REFERRING PROVIDER: Ladell Pier, MD [address] [place], [place] [address]  PRIMARY PROVIDER:  Monico Blitz, MD  PRIMARY REASON FOR VISIT:  1. Malignant neoplasm of colon, unspecified part of colon ([place])   2. Family history of ovarian cancer   3. Family history of breast cancer     HISTORY OF PRESENT ILLNESS:   Jeremy Stevenson, a 66 y.o. male, was seen for a [facility] cancer genetics consultation at the request of Dr. Benay Spice due to a personal and family history of cancer.  Jeremy Stevenson presents to clinic today to discuss the possibility of a hereditary predisposition to cancer, genetic testing, and to further clarify his future cancer risks, as well as potential cancer risks for family members. Jeremy Stevenson was accompanied to his appointment by his wife.  In March 2018, at the age of 18, Jeremy Stevenson was diagnosed with colon cancer. This was treated with surgical resection only. IHC analysis of his resection specimen showed retained expression of the mismatch repair genes (MLH1, MSH2, MSH6, and PMS2). MSI analysis was stable.   CANCER HISTORY:   No history exists.    Past Medical History:  Diagnosis Date  . Hypertension   . 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 COLONOSCOPY N/A 01/02/2017   Procedure: COLONOSCOPY;  Surgeon: Rogene Houston, MD;  [facility];  Service: Endoscopy;  Laterality: N/A;  1000  . HERNIA REPAIR     66 years old  . LAPAROSCOPIC SIGMOID COLECTOMY N/A 01/20/2017   Procedure: LAPAROSCOPIC LEFT COLECTOMY, TAKEDOWN SPLENIC FLEXURE, AND BIOPSY OF PERITONEAL NODULE;  Surgeon: Fanny Skates, MD;  Location: [facility];  Service: General;  Laterality: N/A;  . TONSILLECTOMY     66 years old    Social History   Social History  . Marital status: Married    Spouse name: N/A  . Number of children: N/A  . Years of education: N/A   Social History Main Topics  . Smoking status:  Never Smoker  . Smokeless tobacco: Never Used  . Alcohol use No  . Drug use: No  . Sexual activity: Yes   Other Topics Concern  . Not on file   Social History Narrative  . No narrative on file     FAMILY HISTORY:  We obtained a detailed, 4-generation family history.  Significant diagnoses are listed below: Family History  Problem Relation Age of Onset  . Breast cancer Mother 20  . Skin cancer Mother   . Ovarian cancer Sister 12    d.50 due to metastases  . Skin cancer Maternal Uncle   . Skin cancer Cousin 61  . Skin cancer Cousin 50   Jeremy Stevenson has a daughter, age 16, without cancers. He has one sister who was diagnosed with ovarian cancer at age 51. His sister was treated with surgery and chemotherapy. He reports that she was cancer-free for about a year when her cancer recurred and she died within 11 months at age 78. His sister had one son, age 76, without cancers.  Mr. Spivak mother died at age 34 and had a history of breast cancer in her late-70s. She also had a history of skin cancer. Mr. Hunger mother had four brothers and two sisters. One sister is alive at age 29 and recently had an unspecified form of cancer. The rest of her siblings are deceased. One brother had skin cancer. Furthermore, two of Mr. Bollman  maternal first-cousins (sisters to each other) are currently in their 18s and have had skin cancers. Mr. Arriaga maternal grandparents died in their 62s without cancers.  Mr. Botelho father died at 56 from heart problems. He did not have cancers. Mr. Virginia father was an only child. Mr. Kamphaus paternal grandmother died in her early-70s without cancers. His paternal grandfather died in his early-50s by suicide.  Jeremy Stevenson is unaware of previous family history of genetic testing for hereditary cancer risks. Patient's maternal ancestors are of [place] descent, and paternal ancestors are of Native [place] and Caucasian descent. There is no reported Ashkenazi Jewish  ancestry. There is no known consanguinity.  GENETIC COUNSELING ASSESSMENT: Cataldo S Stevenson is a 66 y.o. male with a personal and family history which is somewhat suggestive of a hereditary cancer syndrome and predisposition to cancer. We, therefore, discussed and recommended the following at today's visit.   DISCUSSION: We reviewed the characteristics, features and inheritance patterns of hereditary cancer syndromes. We also discussed genetic testing, including the appropriate family members to test, the process of testing, insurance coverage and turn-around-time for results. We discussed the implications of a negative, positive and/or variant of uncertain significant result. We recommended Jeremy Stevenson pursue genetic testing for the 46-gene Common Hereditary Cancer Panel offered by [facility].   Based on Mr. Magoon personal and family history of cancer, he meets medical criteria for genetic testing. Despite that he meets criteria, he may still have an out of pocket cost. We discussed that if his out of pocket cost for testing is over $100, the laboratory will call and confirm whether he wants to proceed with testing.  If the out of pocket cost of testing is less than $100 he will be billed by the genetic testing laboratory.   PLAN: After considering the risks, benefits, and limitations, Jeremy Stevenson  provided informed consent to pursue genetic testing and the blood sample was sent to [facility] for analysis of the 46-gene Common Hereditary Cancers Panel. Results should be available within approximately 3 weeks' time, at which point they will be disclosed by telephone to Jeremy Stevenson, as will any additional recommendations warranted by these results. Jeremy Stevenson will receive a summary of his genetic counseling visit and a copy of his results once available. This information will also be available in Epic.   Lastly, we encouraged Jeremy Stevenson to remain in contact with cancer genetics annually so that we can  continuously update the family history and inform him of any changes in cancer genetics and testing that may be of benefit for this family.   Mr.  Unangst questions were answered to his satisfaction today. Our contact information was provided should additional questions or concerns arise. Thank you for the referral and allowing [place] to share in the care of your patient.   Mal Misty, MS, [facility] Certified Naval [email or web address] phone: [phone]  The patient was seen for a total of 35 minutes in face-to-face genetic counseling.  This patient was discussed with Drs. Magrinat, Lindi Adie and/or Burr Medico who agrees with the above.   _______________________________________________________________________ For Office Staff:  Number of people involved in session: 2 Was an Intern/ student involved with case: no

## 2017-03-16 ENCOUNTER — Ambulatory Visit: Payer: Self-pay | Admitting: Genetics

## 2017-03-16 ENCOUNTER — Telehealth: Payer: Self-pay | Admitting: Genetics

## 2017-03-16 ENCOUNTER — Encounter: Payer: Self-pay | Admitting: Genetics

## 2017-03-16 DIAGNOSIS — Z1379 Encounter for other screening for genetic and chromosomal anomalies: Secondary | ICD-10-CM

## 2017-03-16 HISTORY — DX: Encounter for other screening for genetic and chromosomal anomalies: Z13.79

## 2017-03-16 NOTE — Progress Notes (Signed)
HPI: Mr. Martinique was previously seen in the [facility] clinic due to a personal history of colon cancer, family history of breast and ovarian cancer, and concerns regarding a hereditary predisposition to cancer. Please refer to our prior cancer genetics clinic note for more information regarding Mr. Sausedo medical, social and family histories, and our assessment and recommendations, at the time. Per Mr. Hyland request, recent genetic test results were disclosed to his wife, Geraldo Pitter, as were recommendations warranted by these results. These results and recommendations are discussed in more detail below.  CANCER HISTORY:    Cancer of descending colon ([place])   02/13/2017 Initial Diagnosis    Cancer of descending colon ([place])     03/03/2017 Genetic Testing    Genetic counseling and testing for hereditary cancer syndromes performed on 03/03/2017. Results are negative for pathogenic mutations in 46 genes analyzed by [facility]'s Common Hereditary Cancers Panel. Results are dated 03/11/2017. Genes tested: APC, ATM, AXIN2, BARD1, BMPR1A, BRCA1, BRCA2, BRIP1, CDH1, CDKN2A, CHEK2, CTNNA1, DICER1, EPCAM, GREM1, HOXB13, KIT, MEN1, MLH1, MSH2, MSH3, MSH6, MUTYH, NBN, NF1, NTHL1, PALB2, PDGFRA, PMS2, POLD1, POLE, PTEN, RAD50, RAD51C, RAD51D, SDHA, SDHB, SDHC, SDHD, SMAD4, SMARCA4, STK11, TP53, TSC1, TSC2, and VHL.          FAMILY HISTORY:  We obtained a detailed, 4-generation family history.  Significant diagnoses are listed below: Family History  Problem Relation Age of Onset  . Breast cancer Mother 26  . Skin cancer Mother   . Ovarian cancer Sister 50    d.50 due to metastases  . Skin cancer Maternal Uncle   . Skin cancer Cousin 46  . Skin cancer Cousin 62   Mr. Martinique has a daughter, age 42, without cancers. He has one sister who was diagnosed with ovarian cancer at age 45. His sister was treated with surgery and chemotherapy. He reports that she was cancer-free for about a year when  her cancer recurred and she died within 7 months at age 71. His sister had one son, age 31, without cancers.  Mr. Alsip mother died at age 68 and had a history of breast cancer in her late-70s. She also had a history of skin cancer. Mr. Dauphinais mother had four brothers and two sisters. One sister is alive at age 39 and recently had an unspecified form of cancer. The rest of her siblings are deceased. One brother had skin cancer. Furthermore, two of Mr. Philipp maternal first-cousins (sisters to each other) are currently in their 23s and have had skin cancers. Mr. Rufo maternal grandparents died in their 31s without cancers.  Mr. Gowin father died at 69 from heart problems. He did not have cancers. Mr. Mcclinton father was an only child. Mr. Tamburrino paternal grandmother died in her early-70s without cancers. His paternal grandfather died in his early-50s by suicide.  Mr. Martinique is unaware of previous family history of genetic testing for hereditary cancer risks. Patient's maternal ancestors are of [place] descent, and paternal ancestors are of Native [place] and Caucasian descent. There is no reported Ashkenazi Jewish ancestry. There is no known consanguinity.  GENETIC TEST RESULTS: Genetic testing performed through [facility]'s Common Hereditary Cancers Panel reported out on 03/11/2017 showed no deleterious mutations. [facility] Common Hereditary Cancers Panel includes analysis of the following 46 genes: APC, ATM, AXIN2, BARD1, BMPR1A, BRCA1, BRCA2, BRIP1, CDH1, CDKN2A, CHEK2, CTNNA1, DICER1, EPCAM, GREM1, HOXB13, KIT, MEN1, MLH1, MSH2, MSH3, MSH6, MUTYH, NBN, NF1, NTHL1, PALB2, PDGFRA, PMS2, POLD1, POLE, PTEN, RAD50, RAD51C, RAD51D, SDHA, SDHB,  SDHC, SDHD, SMAD4, SMARCA4, STK11, TP53, TSC1, TSC2, and VHL.  The test report will be scanned into EPIC and will be located under the Molecular Pathology section of the Results Review tab.A portion of the result report is included below for reference.      We discussed with Mr. Martinique that since the current genetic testing is not perfect, it is possible there may be a gene mutation in one of these genes that current testing cannot detect, but that chance is small. We also discussed, that it is possible that another gene that has not yet been discovered, or that we have not yet tested, is responsible for the cancer diagnoses in the family. Therefore, important to remain in touch with cancer genetics in the future so that we can continue to offer Mr. Martinique the most up to date genetic testing.   CANCER SCREENING RECOMMENDATIONS: This result is reassuring and indicates that it is unlikely Mr. Martinique has an increased risk for a future cancer due to a mutation in one of these genes. This normal test also suggests that Mr. Lorusso cancer was most likely not due to an inherited predisposition associated with one of these genes.  Most cancers happen by chance and this negative test suggests that his cancer falls into this category. Therefore, it is recommended he continue to follow the cancer management and screening guidelines provided by his oncology and primary healthcare provider.   RECOMMENDATIONS FOR FAMILY MEMBERS: Women in this family might be at some increased risk of developing ovarian cancer, over the general population risk, simply due to the family history of cancer. However, the risk based on family history alone is not thought to be nearly as high as risk conferred by an inherited mutation.Currently, there are no established screening guidelines for individuals with a family history of ovarian cancer. If Mr. Heier male relatives would like to consider screening for ovarian cancers, they should consult their health care providers regarding options.  Due to Mr. Weihe diagnosis of colon cancer, Mr. Monts daughter should begin colonoscopies at age 55 and repeat at least every 5 years unless otherwise advised by her physicians.  Based on  Mr. Pote family history, his nephew also appears to be a candidate for genetic testing. Extendedmaternal family members (aunt, cousins) may also be candidates for testing. If these family members would like to learn more about hereditary cancer risk and genetic testing options, they should discuss with a genetic counselor and/or their health care providers. Mr. Martinique was encouraged to contact us if we can be of assistance in identifying genetic counseling and testing resources for these family members.   FOLLOW-UP: Lastly, we discussed that cancer genetics is a rapidly advancing field and it is possible that new genetic tests will be appropriate for him and/or his family members in the future. We encouraged him to remain in contact with cancer genetics on an annual basis so we can update his personal and family histories and let him know of advances in cancer genetics that may benefit this family.   Our contact number was provided. Mr. Rogerson wife's questions were answered to her satisfaction, and they both know they are welcome to call us at anytime with additional questions or concerns.   Mal Misty, MS, [facility] Certified Naval [email or web address]

## 2017-03-16 NOTE — Telephone Encounter (Signed)
[name] was called initially. He answered the phone call while at work and requested that I call his wife, Jeremy Stevenson, at [phone] and disclose results to her rather than him while he was at work. I called Jeremy Stevenson and disclosed Jeremy Stevenson results.  We reviewed that germline genetic testing revealed no pathogenic mutations. This is considered to be a negative result. Testing was performed through [facility]'s 46-gene Common Hereditary Cancers Panel. [facility] Common Hereditary Cancers Panel includes analysis of the following 46 genes: APC, ATM, AXIN2, BARD1, BMPR1A, BRCA1, BRCA2, BRIP1, CDH1, CDKN2A, CHEK2, CTNNA1, DICER1, EPCAM, GREM1, HOXB13, KIT, MEN1, MLH1, MSH2, MSH3, MSH6, MUTYH, NBN, NF1, NTHL1, PALB2, PDGFRA, PMS2, POLD1, POLE, PTEN, RAD50, RAD51C, RAD51D, SDHA, SDHB, SDHC, SDHD, SMAD4, SMARCA4, STK11, TP53, TSC1, TSC2, and VHL.  Jeremy Stevenson was advised that Jeremy Stevenson daughter should begin colonoscopies at age 34 and repeat at least every 5 years unless otherwise advised by her physicians. Jeremy Stevenson agreed to inform Jeremy Stevenson daughter. Selana and [name] were both encouraged to call me with any further questions about hereditary risk/these results.  For more detailed discussion, please see genetic counseling documentation from 03/16/2017. Result report dated 03/11/2017.

## 2017-03-16 NOTE — Telephone Encounter (Deleted)
-----   Message from Mal Misty sent at 03/03/2017  3:06 PM EDT ----- Regarding: Call Results Route to Dr. Benay Spice. If negative, remind daughter to begin colonoscopy at 51 and repeat every 5 years.

## 2017-06-15 ENCOUNTER — Encounter (INDEPENDENT_AMBULATORY_CARE_PROVIDER_SITE_OTHER): Payer: Self-pay | Admitting: *Deleted

## 2017-06-25 ENCOUNTER — Other Ambulatory Visit (INDEPENDENT_AMBULATORY_CARE_PROVIDER_SITE_OTHER): Payer: Self-pay | Admitting: *Deleted

## 2017-06-25 ENCOUNTER — Telehealth (INDEPENDENT_AMBULATORY_CARE_PROVIDER_SITE_OTHER): Payer: Self-pay | Admitting: *Deleted

## 2017-06-25 ENCOUNTER — Encounter (INDEPENDENT_AMBULATORY_CARE_PROVIDER_SITE_OTHER): Payer: Self-pay | Admitting: *Deleted

## 2017-06-25 DIAGNOSIS — Z85038 Personal history of other malignant neoplasm of large intestine: Secondary | ICD-10-CM

## 2017-06-25 MED ORDER — PEG 3350-KCL-NA BICARB-NACL 420 G PO SOLR: 4000.0000 mL | Freq: Once | ORAL | 0 refills | Status: AC

## 2017-06-25 NOTE — Telephone Encounter (Signed)
Patient needs trilyte 

## 2017-06-25 NOTE — Telephone Encounter (Signed)
I have received CBC

## 2017-07-07 ENCOUNTER — Telehealth (INDEPENDENT_AMBULATORY_CARE_PROVIDER_SITE_OTHER): Payer: Self-pay | Admitting: *Deleted

## 2017-07-07 NOTE — Telephone Encounter (Signed)
agree

## 2017-07-07 NOTE — Telephone Encounter (Signed)
Referring MD/PCP: shah   Procedure: tcs  Reason/Indication:  Hx colon ca  Has patient had this procedure before?  Yes, 12/2016  If so, when, by whom and where?    Is there a family history of colon cancer?  no  Who?  What age when diagnosed?    Is patient diabetic?   no      Does patient have prosthetic heart valve or mechanical valve?  no  Do you have a pacemaker?  no  Has patient ever had endocarditis? no  Has patient had joint replacement within last 12 months?  no  Does patient tend to be constipated or take laxatives? no  Does patient have a history of alcohol/drug use?  no  Is patient on Coumadin, Plavix and/or Aspirin? no  Medications: see epic  Allergies: codeine  Medication Adjustment per Dr Laural Golden:   Procedure date & time: 08/07/17 at 1045

## 2017-08-07 ENCOUNTER — Encounter (HOSPITAL_COMMUNITY): Payer: Self-pay | Admitting: *Deleted

## 2017-08-07 ENCOUNTER — Ambulatory Visit (HOSPITAL_COMMUNITY)
Admission: RE | Admit: 2017-08-07 | Discharge: 2017-08-07 | Disposition: A | Discharge status: Home / Self Care | Payer: Managed Care, Other (non HMO) | Source: Ambulatory Visit | Attending: Internal Medicine | Admitting: Internal Medicine

## 2017-08-07 ENCOUNTER — Encounter (HOSPITAL_COMMUNITY)
Admission: RE | Disposition: A | Discharge status: Home / Self Care | Payer: Self-pay | Source: Ambulatory Visit | Attending: Internal Medicine

## 2017-08-07 DIAGNOSIS — Z08 Encounter for follow-up examination after completed treatment for malignant neoplasm: Secondary | ICD-10-CM | POA: Diagnosis not present

## 2017-08-07 DIAGNOSIS — Z885 Allergy status to narcotic agent status: Secondary | ICD-10-CM | POA: Insufficient documentation

## 2017-08-07 DIAGNOSIS — D122 Benign neoplasm of ascending colon: Secondary | ICD-10-CM | POA: Diagnosis not present

## 2017-08-07 DIAGNOSIS — K5730 Diverticulosis of large intestine without perforation or abscess without bleeding: Secondary | ICD-10-CM

## 2017-08-07 DIAGNOSIS — K635 Polyp of colon: Secondary | ICD-10-CM | POA: Diagnosis not present

## 2017-08-07 DIAGNOSIS — Z1211 Encounter for screening for malignant neoplasm of colon: Secondary | ICD-10-CM | POA: Insufficient documentation

## 2017-08-07 DIAGNOSIS — I10 Essential (primary) hypertension: Secondary | ICD-10-CM | POA: Diagnosis not present

## 2017-08-07 DIAGNOSIS — Z85038 Personal history of other malignant neoplasm of large intestine: Secondary | ICD-10-CM | POA: Diagnosis not present

## 2017-08-07 DIAGNOSIS — Z79899 Other long term (current) drug therapy: Secondary | ICD-10-CM | POA: Diagnosis not present

## 2017-08-07 DIAGNOSIS — Z980 Intestinal bypass and anastomosis status: Secondary | ICD-10-CM | POA: Diagnosis not present

## 2017-08-07 DIAGNOSIS — D175 Benign lipomatous neoplasm of intra-abdominal organs: Secondary | ICD-10-CM | POA: Diagnosis not present

## 2017-08-07 DIAGNOSIS — D123 Benign neoplasm of transverse colon: Secondary | ICD-10-CM | POA: Diagnosis not present

## 2017-08-07 HISTORY — PX: COLONOSCOPY: SHX5424

## 2017-08-07 SURGERY — COLONOSCOPY
Anesthesia: Moderate Sedation

## 2017-08-07 MED ORDER — MIDAZOLAM HCL 5 MG/5ML IJ SOLN
INTRAMUSCULAR | Status: DC | PRN
  Administered 2017-08-07: 2 mg via INTRAVENOUS
  Administered 2017-08-07: 3 mg via INTRAVENOUS
  Administered 2017-08-07: 1 mg via INTRAVENOUS
  Administered 2017-08-07: 2 mg via INTRAVENOUS

## 2017-08-07 MED ORDER — MEPERIDINE HCL 50 MG/ML IJ SOLN
INTRAMUSCULAR | Status: DC | PRN
Start: 2017-08-07 — End: 2017-08-07
  Administered 2017-08-07 (×2): 25 mg via INTRAVENOUS

## 2017-08-07 MED ORDER — MEPERIDINE HCL 50 MG/ML IJ SOLN
INTRAMUSCULAR | Status: AC
  Filled 2017-08-07: qty 1

## 2017-08-07 MED ORDER — MIDAZOLAM HCL 5 MG/5ML IJ SOLN
INTRAMUSCULAR | Status: AC
  Filled 2017-08-07: qty 10

## 2017-08-07 MED ORDER — SODIUM CHLORIDE 0.9 % IV SOLN
INTRAVENOUS | Status: DC
  Administered 2017-08-07: 1000 mL via INTRAVENOUS

## 2017-08-07 NOTE — H&P (Signed)
Jeremy Stevenson is an 66 y.o. male.   Chief Complaint:  Patient is here for colonoscopy. HPI: Jeremy Stevenson is 66 year old Caucasian male who underwent colonoscopy in February this year was found to have near obstructing proximal sigmoid colon mass.he underwent left hemicolectomy in March this year. He had T3 N0 MX disease. He did not require adjuvant therapy. His colonoscopy was incomplete because of obstructing mass. He is therefore returning for repeat exam so that full examination can be performed. Family history is negative for colon carcinoma non-GI malignancies.   Past Medical History:  Diagnosis Date  . Genetic testing 03/16/2017   [name] underwent genetic counseling and testing for hereditary cancer syndromes on 03/03/2017. His results were negative for mutations in all 46 genes analyzed by [facility]'s Common Hereditary Cancers Panel. Genes analyzed include: APC, ATM, AXIN2, BARD1, BMPR1A, BRCA1, BRCA2, BRIP1, CDH1, CDKN2A, CHEK2, CTNNA1, DICER1, EPCAM, GREM1, HOXB13, KIT, MEN1, MLH1, MSH2, MSH3, MSH6, MUTYH, NBN, NF1, NT  . Hypertension   . 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 COLONOSCOPY N/A 01/02/2017   Procedure: COLONOSCOPY;  Surgeon: Rogene Houston, MD;  [facility];  Service: Endoscopy;  Laterality: N/A;  1000  . HERNIA REPAIR     66 years old  . LAPAROSCOPIC SIGMOID COLECTOMY N/A 01/20/2017   Procedure: LAPAROSCOPIC LEFT COLECTOMY, TAKEDOWN SPLENIC FLEXURE, AND BIOPSY OF PERITONEAL NODULE;  Surgeon: Fanny Skates, MD;  Location: [facility];  Service: General;  Laterality: N/A;  . TONSILLECTOMY     66 years old    Family History  Problem Relation Age of Onset  . Breast cancer Mother 52  . Skin cancer Mother   . Ovarian cancer Sister 71       d.50 due to metastases  . Skin cancer Maternal Uncle   . Skin cancer Cousin 23  . Skin cancer Cousin 87   Social History:  reports that he has never  smoked. He has never used smokeless tobacco. He reports that he does not drink alcohol or use drugs.  Allergies:  Allergies  Allergen Reactions  . Codeine Nausea Only    Medications Prior to Admission  Medication Sig Dispense Refill  . amLODipine (NORVASC) 5 MG tablet Take 5 mg by mouth at bedtime.     . chlorthalidone (HYGROTON) 25 MG tablet Take 25 mg by mouth at bedtime.     . citalopram (CELEXA) 20 MG tablet Take 20 mg by mouth at bedtime.     Marland Kitchen losartan (COZAAR) 100 MG tablet Take 100 mg by mouth at bedtime.     . metoprolol (LOPRESSOR) 100 MG tablet Take 100 mg by mouth 2 (two) times daily.     Marland Kitchen HYDROcodone-acetaminophen (NORCO) 7.5-325 MG tablet Take 1-2 tablets by mouth every 4 (four) hours as needed for moderate pain. 30 tablet 0  . LORazepam (ATIVAN) 0.5 MG tablet Take 0.5 mg by mouth at bedtime as needed.    . ondansetron (ZOFRAN) 4 MG tablet Take 1 tablet (4 mg total) by mouth every 6 (six) hours as needed for nausea. 20 tablet 0    No results found for this or any previous visit (from the past 48 hour(s)). No results found.  ROS  Blood pressure (!) 160/85, pulse 82, temperature 98 F (36.7 C), temperature source Oral, resp. rate 19, height '6\' 2"'  (1.88 m), weight 290 lb (131.5 kg), SpO2 100 %. Physical Exam  Constitutional: He appears well-developed  and well-nourished.  HENT:  Mouth/Throat: Oropharynx is clear and moist.  Eyes: Conjunctivae are normal. No scleral icterus.  Neck: No thyromegaly present.  Cardiovascular: Normal rate, regular rhythm and normal heart sounds.   No murmur heard. Respiratory: Effort normal and breath sounds normal.  GI:  Abdomen is full. Midline scar noted.anomegaly or masses.  Musculoskeletal: He exhibits no edema.  Lymphadenopathy:    He has no cervical adenopathy.  Neurological: He is alert.  Skin: Skin is warm and dry.     Assessment/Plan History of colon carcinoma. First colonoscopy was incomplete because of obstructing  mass. Surveillance colonoscopy.  Hildred Laser, MD 08/07/2017, 10:14 AM

## 2017-08-07 NOTE — Op Note (Signed)
[facility] Patient Name: Jeremy Stevenson Procedure Date: 08/07/2017 9:53 AM MRN: [id number] Date of Birth: Nov 07, 1951 Attending MD: Hildred Laser , MD CSN: [id number] Age: 66 Admit Type: Outpatient Procedure:                Colonoscopy Indications:              High risk colon cancer surveillance: Personal                            history of colon cancer Providers:                Hildred Laser, MD, Janeece Riggers, RN, Rosina Lowenstein, RN Referring MD:             Monico Blitz, MD and Betsy Coder, MD Medicines:                Meperidine 50 mg IV, Midazolam 8 mg IV Complications:            No immediate complications. Estimated Blood Loss:     Estimated blood loss was minimal. Procedure:                Pre-Anesthesia Assessment:                           - Prior to the procedure, a History and Physical                            was performed, and patient medications and                            allergies were reviewed. The patient's tolerance of                            previous anesthesia was also reviewed. The risks                            and benefits of the procedure and the sedation                            options and risks were discussed with the patient.                            All questions were answered, and informed consent                            was obtained. Prior Anticoagulants: The patient has                            taken no previous anticoagulant or antiplatelet                            agents. ASA Grade Assessment: II - A patient with                            mild systemic disease. After reviewing the risks  and benefits, the patient was deemed in                            satisfactory condition to undergo the procedure.                           After obtaining informed consent, the colonoscope                            was passed under direct vision. Throughout the                            procedure, the patient's  blood pressure, pulse, and                            oxygen saturations were monitored continuously. The                            EC-[id number] ([id number]) scope was introduced through                            the anus and advanced to the the cecum, identified                            by appendiceal orifice and ileocecal valve. The                            colonoscopy was performed without difficulty. The                            ileocecal valve, appendiceal orifice, and rectum                            were photographed. The quality of the bowel                            preparation was adequate. Scope In: 10:28:02 AM Scope Out: 10:56:10 AM Scope Withdrawal Time: 0 hours 25 minutes 9 seconds  Total Procedure Duration: 0 hours 28 minutes 8 seconds  Findings:      The perianal and digital rectal examinations were normal.      Scattered small and large-mouthed diverticula were found in the sigmoid       colon, hepatic flexure and cecum.      There was a medium-sized lipoma, 10 mm in diameter, at the hepatic       flexure.      A small polyp was found in the ascending colon. The polyp was sessile.       Biopsies were taken with a cold forceps for histology. The pathology       specimen was placed into Bottle Number 1.      Two sessile polyps were found in the transverse colon and ascending       colon. The polyps were 4 to 7 mm in size. These polyps were removed with       a cold snare. Resection and retrieval were complete. The pathology  specimen was placed into Bottle Number 1.      A 12 mm polyp was found in the transverse colon. The polyp was       pedunculated. The polyp was removed with a hot snare. Resection and       retrieval were complete. The pathology specimen was placed into Bottle       Number 2.      There was evidence of a prior end-to-end colo-colonic anastomosis at 45       cm proximal to the anus. This was patent.      The retroflexed view of the distal  rectum and anal verge was normal and       showed no anal or rectal abnormalities. Impression:               - Diverticulosis in the sigmoid colon, at the                            hepatic flexure and in the cecum.                           - Medium-sized lipoma at the hepatic flexure.                           - One small polyp in the ascending colon. Biopsied.                           - Two 4 to 7 mm polyps in the transverse colon and                            in the ascending colon, removed with a cold snare.                            Resected and retrieved.                           - One 12 mm polyp in the transverse colon, removed                            with a hot snare. Resected and retrieved.                           - Patent end-to-end colo-colonic anastomosis. Moderate Sedation:      Moderate (conscious) sedation was administered by the endoscopy nurse       and supervised by the endoscopist. The following parameters were       monitored: oxygen saturation, heart rate, blood pressure, CO2       capnography and response to care. Total physician intraservice time was       35 minutes. Recommendation:           - Patient has a contact number available for                            emergencies. The signs and symptoms of potential  delayed complications were discussed with the                            patient. Return to normal activities tomorrow.                            Written discharge instructions were provided to the                            patient.                           - High fiber diet today.                           - Continue present medications.                           - No aspirin, ibuprofen, naproxen, or other                            non-steroidal anti-inflammatory drugs for 7 days.                           - Await pathology results.                           - Repeat colonoscopy in 3 years for  surveillance. Procedure Code(s):        --- Professional ---                           [phone], Colonoscopy, flexible; with removal of                            tumor(s), polyp(s), or other lesion(s) by snare                            technique                           45380, 59, Colonoscopy, flexible; with biopsy,                            single or multiple                           99152, Moderate sedation services provided by the                            same physician or other qualified health care                            professional performing the diagnostic or                            therapeutic service that the sedation supports,  requiring the presence of an independent trained                            observer to assist in the monitoring of the                            patient's level of consciousness and physiological                            status; initial 15 minutes of intraservice time,                            patient age 12 years or older                           [phone], Moderate sedation services; each additional                            15 minutes intraservice time Diagnosis Code(s):        --- Professional ---                           [phone], Personal history of other malignant                            neoplasm of large intestine                           D17.5, Benign lipomatous neoplasm of                            intra-abdominal organs                           D12.2, Benign neoplasm of ascending colon                           D12.3, Benign neoplasm of transverse colon (hepatic                            flexure or splenic flexure)                           Z98.0, Intestinal bypass and anastomosis status                           K57.30, Diverticulosis of large intestine without                            perforation or abscess without bleeding CPT copyright 2016 American Medical Association. All rights  reserved. The codes documented in this report are preliminary and upon coder review may  be revised to meet current compliance requirements. Hildred Laser, MD Hildred Laser, MD 08/07/2017 11:12:13 AM This report has been signed electronically. Number of Addenda: 0

## 2017-08-07 NOTE — Discharge Instructions (Signed)
No aspirin or NSAIDs for 1 week. Resume usual medications as before. High fiber diet. No driving for 24 hours. Physician will call with biopsy results.    Colonoscopy, Adult, Care After This sheet gives you information about how to care for yourself after your procedure. Your health care provider may also give you more specific instructions. If you have problems or questions, contact your health care provider. What can I expect after the procedure? After the procedure, it is common to have:  A small amount of blood in your stool for 24 hours after the procedure.  Some gas.  Mild abdominal cramping or bloating.  Follow these instructions at home: General instructions   For the first 24 hours after the procedure: ? Do not drive or use machinery. ? Do not sign important documents. ? Do not drink alcohol. ? Do your regular daily activities at a slower pace than normal. ? Eat soft, easy-to-digest foods. ? Rest often.  Take over-the-counter or prescription medicines only as told by your health care provider.  It is up to you to get the results of your procedure. Ask your health care provider, or the department performing the procedure, when your results will be ready. Relieving cramping and bloating  Try walking around when you have cramps or feel bloated.  Apply heat to your abdomen as told by your health care provider. Use a heat source that your health care provider recommends, such as a moist heat pack or a heating pad. ? Place a towel between your skin and the heat source. ? Leave the heat on for 20-30 minutes. ? Remove the heat if your skin turns bright red. This is especially important if you are unable to feel pain, heat, or cold. You may have a greater risk of getting burned. Eating and drinking  Drink enough fluid to keep your urine clear or pale yellow.  Resume your normal diet as instructed by your health care provider. Avoid heavy or fried foods that are hard to  digest.  Avoid drinking alcohol for as long as instructed by your health care provider. Contact a health care provider if:  You have blood in your stool 2-3 days after the procedure. Get help right away if:  You have more than a small spotting of blood in your stool.  You pass large blood clots in your stool.  Your abdomen is swollen.  You have nausea or vomiting.  You have a fever.  You have increasing abdominal pain that is not relieved with medicine. This information is not intended to replace advice given to you by your health care provider. Make sure you discuss any questions you have with your health care provider. Document Released: 06/17/2004 Document Revised: 07/28/2016 Document Reviewed: 01/15/2016 Elsevier Interactive Patient Education  2018 [employer].   High-Fiber Diet Fiber, also called dietary fiber, is a type of carbohydrate found in fruits, vegetables, whole grains, and beans. A high-fiber diet can have many health benefits. Your health care provider may recommend a high-fiber diet to help:  Prevent constipation. Fiber can make your bowel movements more regular.  Lower your cholesterol.  Relieve hemorrhoids, uncomplicated diverticulosis, or irritable bowel syndrome.  Prevent overeating as part of a weight-loss plan.  Prevent heart disease, type 2 diabetes, and certain cancers.  What is my plan? The recommended daily intake of fiber includes:  38 grams for men under age 106.  24 grams for men over age 60.  56 grams for women under age 48.  21 grams for women over age 80.  You can get the recommended daily intake of dietary fiber by eating a variety of fruits, vegetables, grains, and beans. Your health care provider may also recommend a fiber supplement if it is not possible to get enough fiber through your diet. What do I need to know about a high-fiber diet?  Fiber supplements have not been widely studied for their effectiveness, so it is better  to get fiber through food sources.  Always check the fiber content on thenutrition facts label of any prepackaged food. Look for foods that contain at least 5 grams of fiber per serving.  Ask your dietitian if you have questions about specific foods that are related to your condition, especially if those foods are not listed in the following section.  Increase your daily fiber consumption gradually. Increasing your intake of dietary fiber too quickly may cause bloating, cramping, or gas.  Drink plenty of water. Water helps you to digest fiber. What foods can I eat? Grains Whole-grain breads. Multigrain cereal. Oats and oatmeal. Brown rice. Barley. Bulgur wheat. [place]. Bran muffins. Popcorn. Rye wafer crackers. Vegetables Sweet potatoes. Spinach. Kale. Artichokes. Cabbage. Broccoli. Green peas. Carrots. Squash. Fruits Berries. Pears. Apples. Oranges. Avocados. Prunes and raisins. Dried figs. Meats and Other Protein Sources [place], kidney, pinto, and soy beans. Split peas. Lentils. Nuts and seeds. Dairy Fiber-fortified yogurt. Beverages Fiber-fortified soy milk. Fiber-fortified orange juice. Other Fiber bars. The items listed above may not be a complete list of recommended foods or beverages. Contact your dietitian for more options. What foods are not recommended? Grains White bread. Pasta made with refined flour. White rice. Vegetables Fried potatoes. Canned vegetables. Well-cooked vegetables. Fruits Fruit juice. Cooked, strained fruit. Meats and Other Protein Sources Fatty cuts of meat. Fried Sales executive or fried fish. Dairy Milk. Yogurt. Cream cheese. Sour cream. Beverages Soft drinks. Other Cakes and pastries. Butter and oils. The items listed above may not be a complete list of foods and beverages to avoid. Contact your dietitian for more information. What are some tips for including high-fiber foods in my diet?  Eat a wide variety of high-fiber foods.  Make sure that half  of all grains consumed each day are whole grains.  Replace breads and cereals made from refined flour or white flour with whole-grain breads and cereals.  Replace white rice with brown rice, bulgur wheat, or millet.  Start the day with a breakfast that is high in fiber, such as a cereal that contains at least 5 grams of fiber per serving.  Use beans in place of meat in soups, salads, or pasta.  Eat high-fiber snacks, such as berries, raw vegetables, nuts, or popcorn. This information is not intended to replace advice given to you by your health care provider. Make sure you discuss any questions you have with your health care provider. Document Released: 11/03/2005 Document Revised: 04/10/2016 Document Reviewed: 04/18/2014 Elsevier Interactive Patient Education  2017 Else[employer] Inc.   Diverticulosis Diverticulosis is a condition that develops when small pouches (diverticula) form in the wall of the large intestine (colon). The colon is where water is absorbed and stool is formed. The pouches form when the inside layer of the colon pushes through weak spots in the outer layers of the colon. You may have a few pouches or many of them. What are the causes? The cause of this condition is not known. What increases the risk? The following factors may make you more likely to develop this condition:  Being older than age 80. Your risk for this condition increases with age. Diverticulosis is rare among people younger than age 75. By age 28, many people have it.  Eating a low-fiber diet.  Having frequent constipation.  Being overweight.  Not getting enough exercise.  Smoking.  Taking over-the-counter pain medicines, like aspirin and ibuprofen.  Having a family history of diverticulosis.  What are the signs or symptoms? In most people, there are no symptoms of this condition. If you do have symptoms, they may include:  Bloating.  Cramps in the abdomen.  Constipation or  diarrhea.  Pain in the lower left side of the abdomen.  How is this diagnosed? This condition is most often diagnosed during an exam for other colon problems. Because diverticulosis usually has no symptoms, it often cannot be diagnosed independently. This condition may be diagnosed by:  Using a flexible scope to examine the colon (colonoscopy).  Taking an X-ray of the colon after dye has been put into the colon (barium enema).  Doing a CT scan.  How is this treated? You may not need treatment for this condition if you have never developed an infection related to diverticulosis. If you have had an infection before, treatment may include:  Eating a high-fiber diet. This may include eating more fruits, vegetables, and grains.  Taking a fiber supplement.  Taking a live bacteria supplement (probiotic).  Taking medicine to relax your colon.  Taking antibiotic medicines.  Follow these instructions at home:  Drink 6-8 glasses of water or more each day to prevent constipation.  Try not to strain when you have a bowel movement.  If you have had an infection before: ? Eat more fiber as directed by your health care provider or your diet and nutrition specialist (dietitian). ? Take a fiber supplement or probiotic, if your health care provider approves.  Take over-the-counter and prescription medicines only as told by your health care provider.  If you were prescribed an antibiotic, take it as told by your health care provider. Do not stop taking the antibiotic even if you start to feel better.  Keep all follow-up visits as told by your health care provider. This is important. Contact a health care provider if:  You have pain in your abdomen.  You have bloating.  You have cramps.  You have not had a bowel movement in 3 days. Get help right away if:  Your pain gets worse.  Your bloating becomes very bad.  You have a fever or chills, and your symptoms suddenly get  worse.  You vomit.  You have bowel movements that are bloody or black.  You have bleeding from your rectum. Summary  Diverticulosis is a condition that develops when small pouches (diverticula) form in the wall of the large intestine (colon).  You may have a few pouches or many of them.  This condition is most often diagnosed during an exam for other colon problems.  If you have had an infection related to diverticulosis, treatment may include increasing the fiber in your diet, taking supplements, or taking medicines. This information is not intended to replace advice given to you by your health care provider. Make sure you discuss any questions you have with your health care provider. Document Released: 07/31/2004 Document Revised: 09/22/2016 Document Reviewed: 09/22/2016 Elsevier Interactive Patient Education  2017 [employer].    Colon Polyps Polyps are tissue growths inside the body. Polyps can grow in many places, including the large intestine (colon). A polyp may  be a round bump or a mushroom-shaped growth. You could have one polyp or several. Most colon polyps are noncancerous (benign). However, some colon polyps can become cancerous over time. What are the causes? The exact cause of colon polyps is not known. What increases the risk? This condition is more likely to develop in people who:  Have a family history of colon cancer or colon polyps.  Are older than 10 or older than 45 if they are African American.  Have inflammatory bowel disease, such as ulcerative colitis or Crohn disease.  Are overweight.  Smoke cigarettes.  Do not get enough exercise.  Drink too much alcohol.  Eat a diet that is: ? High in fat and red meat. ? Low in fiber.  Had childhood cancer that was treated with abdominal radiation.  What are the signs or symptoms? Most polyps do not cause symptoms. If you have symptoms, they may include:  Blood coming from your rectum when having a  bowel movement.  Blood in your stool.The stool may look dark red or black.  A change in bowel habits, such as constipation or diarrhea.  How is this diagnosed? This condition is diagnosed with a colonoscopy. This is a procedure that uses a lighted, flexible scope to look at the inside of your colon. How is this treated? Treatment for this condition involves removing any polyps that are found. Those polyps will then be tested for cancer. If cancer is found, your health care provider will talk to you about options for colon cancer treatment. Follow these instructions at home: Diet  Eat plenty of fiber, such as fruits, vegetables, and whole grains.  Eat foods that are high in calcium and vitamin D, such as milk, cheese, yogurt, eggs, liver, fish, and broccoli.  Limit foods high in fat, red meats, and processed meats, such as hot dogs, sausage, bacon, and lunch meats.  Maintain a healthy weight, or lose weight if recommended by your health care provider. General instructions  Do not smoke cigarettes.  Do not drink alcohol excessively.  Keep all follow-up visits as told by your health care provider. This is important. This includes keeping regularly scheduled colonoscopies. Talk to your health care provider about when you need a colonoscopy.  Exercise every day or as told by your health care provider. Contact a health care provider if:  You have new or worsening bleeding during a bowel movement.  You have new or increased blood in your stool.  You have a change in bowel habits.  You unexpectedly lose weight. This information is not intended to replace advice given to you by your health care provider. Make sure you discuss any questions you have with your health care provider. Document Released: 07/30/2004 Document Revised: 04/10/2016 Document Reviewed: 09/24/2015 Elsevier Interactive Patient Education  [employer].

## 2017-08-10 ENCOUNTER — Encounter (HOSPITAL_COMMUNITY): Payer: Self-pay | Admitting: Internal Medicine

## 2017-08-12 ENCOUNTER — Other Ambulatory Visit: Payer: Self-pay

## 2017-08-12 DIAGNOSIS — C186 Malignant neoplasm of descending colon: Secondary | ICD-10-CM

## 2017-08-13 ENCOUNTER — Other Ambulatory Visit (HOSPITAL_BASED_OUTPATIENT_CLINIC_OR_DEPARTMENT_OTHER): Payer: Managed Care, Other (non HMO)

## 2017-08-13 ENCOUNTER — Telehealth: Payer: Self-pay | Admitting: Oncology

## 2017-08-13 ENCOUNTER — Ambulatory Visit (HOSPITAL_BASED_OUTPATIENT_CLINIC_OR_DEPARTMENT_OTHER): Payer: Managed Care, Other (non HMO) | Admitting: Oncology

## 2017-08-13 VITALS — BP 151/80 | HR 65 | Temp 98.3°F | Resp 18 | Ht 74.0 in | Wt 294.8 lb

## 2017-08-13 DIAGNOSIS — Z803 Family history of malignant neoplasm of breast: Secondary | ICD-10-CM

## 2017-08-13 DIAGNOSIS — Z8041 Family history of malignant neoplasm of ovary: Secondary | ICD-10-CM

## 2017-08-13 DIAGNOSIS — F329 Major depressive disorder, single episode, unspecified: Secondary | ICD-10-CM

## 2017-08-13 DIAGNOSIS — N5089 Other specified disorders of the male genital organs: Secondary | ICD-10-CM

## 2017-08-13 DIAGNOSIS — C186 Malignant neoplasm of descending colon: Secondary | ICD-10-CM | POA: Diagnosis not present

## 2017-08-13 DIAGNOSIS — Z85038 Personal history of other malignant neoplasm of large intestine: Secondary | ICD-10-CM

## 2017-08-13 DIAGNOSIS — N433 Hydrocele, unspecified: Secondary | ICD-10-CM

## 2017-08-13 DIAGNOSIS — I10 Essential (primary) hypertension: Secondary | ICD-10-CM | POA: Diagnosis not present

## 2017-08-13 NOTE — Telephone Encounter (Signed)
Gave patient AVS and calendar of upcoming March appointment.

## 2017-08-13 NOTE — Progress Notes (Signed)
  Jeremy Stevenson OFFICE PROGRESS NOTE   Diagnosis: Colon cancer  INTERVAL HISTORY:   [name] returns as scheduled. He feels well other than sinus congestion. He underwent a colonoscopy 08/07/2017. Polyps were removed from the ascending and transverse colon. The pathology revealed tubular adenomas and a sessile serrated polyp.  He underwent a testicular ultrasound on 02/26/2017. Bilateral small hydroceles are present. No testicular mass.  Objective:  Vital signs in last 24 hours:  Blood pressure (!) 151/80, pulse 65, temperature 98.3 F (36.8 C), temperature source Oral, resp. rate 18, height _0  (1.88 m), weight 294 lb 12.8 oz (133.7 kg), SpO2 97 %.    HEENT: Neck without mass Lymphatics: No cervical, supraclavicular, axillary, or inguinal nodes Resp: Lungs with mild wheezing at the right upper posterior chest that cleared after several respirations, no respiratory distress Cardio: Regular rate and rhythm GI: No hepatosplenomegaly, no mass Vascular: No leg edema GU: The right scrotum is larger than left side without a discrete mass   Lab Results:    Lab Results  Component Value Date   CEA1 1.67 02/13/2017     Medications: I have reviewed the patient's current medications.  Assessment/Plan: 1. Adenocarcinoma of the descending colon, stage II (T3 N0), status post a left colectomy 01/20/2017 ? MSI-stable, no loss of mismatch repair protein expression ? Elevated preoperative CEA ? Incomplete preoperative colonoscopy ? Colonoscopy 08/07/2017-multiple polyps removed including tubular adenomas  2.  Enlargement of the right testicle-hydrocele-Testicular ultrasound 02/26/2017-negative for mass, small bilateral hydroceles  3.  Hypertension  4.  Depression  5.  Family history of breast and ovarian cancer, Negative genetic testing    Disposition:  [name] is in clinical remission from colon cancer. We will follow-up on the CEA from today. He  will return for an office visit and CEA in 6 months. The persistent enlargement of the right scrotum may be related to a hernia or hydrocele.  15 minutes were spent with the patient today. The majority of the time was used for counseling and coordination of care.  Donneta Romberg, MD  08/13/2017  4:40 PM

## 2017-08-14 ENCOUNTER — Telehealth: Payer: Self-pay

## 2017-08-14 ENCOUNTER — Other Ambulatory Visit: Payer: Self-pay

## 2017-08-14 DIAGNOSIS — C186 Malignant neoplasm of descending colon: Secondary | ICD-10-CM

## 2017-08-14 LAB — CEA (IN HOUSE-CHCC): CEA (CHCC-In House): 3.65 ng/mL (ref 0.00–5.00)

## 2017-08-14 NOTE — Telephone Encounter (Signed)
Call placed to pt to review his lab results per MD Sherrill. Pt verbalizes understanding and agrees with plan of care.

## 2017-08-14 NOTE — Telephone Encounter (Signed)
-----   Message from Ladell Pier, MD sent at 08/14/2017  4:35 PM EDT ----- Please call patient, CEA is normal,slightly higher in normal range, repeat 3 months

## 2017-08-17 ENCOUNTER — Telehealth: Payer: Self-pay | Admitting: Oncology

## 2017-08-17 NOTE — Telephone Encounter (Signed)
Spoke with patients wife regarding appt scheduled per 9/28 sch msg.

## 2017-10-02 ENCOUNTER — Telehealth: Payer: Self-pay | Admitting: Oncology

## 2017-10-02 NOTE — Telephone Encounter (Signed)
Spoke to patients wife regarding upcoming December and march appointments.

## 2017-11-13 ENCOUNTER — Other Ambulatory Visit (HOSPITAL_BASED_OUTPATIENT_CLINIC_OR_DEPARTMENT_OTHER): Payer: Managed Care, Other (non HMO)

## 2017-11-13 DIAGNOSIS — Z85038 Personal history of other malignant neoplasm of large intestine: Secondary | ICD-10-CM | POA: Diagnosis not present

## 2017-11-13 DIAGNOSIS — C186 Malignant neoplasm of descending colon: Secondary | ICD-10-CM

## 2017-11-16 LAB — CEA (IN HOUSE-CHCC): CEA (CHCC-In House): 2.77 ng/mL (ref 0.00–5.00)

## 2017-11-18 ENCOUNTER — Telehealth: Payer: Self-pay | Admitting: *Deleted

## 2017-11-18 NOTE — Telephone Encounter (Signed)
-----   Message from Jeremy Pier, MD sent at 11/16/2017 10:15 PM EST ----- Please call patient, cea is normal, f/u as scheduled

## 2017-11-18 NOTE — Telephone Encounter (Signed)
Called patient to notify him of normal CEA results

## 2018-01-07 ENCOUNTER — Other Ambulatory Visit: Payer: Self-pay | Admitting: General Surgery

## 2018-01-07 DIAGNOSIS — D490 Neoplasm of unspecified behavior of digestive system: Secondary | ICD-10-CM

## 2018-02-05 ENCOUNTER — Ambulatory Visit
Admission: RE | Admit: 2018-02-05 | Discharge: 2018-02-05 | Disposition: A | Discharge status: Home / Self Care | Payer: Managed Care, Other (non HMO) | Source: Ambulatory Visit | Attending: General Surgery | Admitting: General Surgery

## 2018-02-05 DIAGNOSIS — D490 Neoplasm of unspecified behavior of digestive system: Secondary | ICD-10-CM

## 2018-02-05 MED ORDER — IOPAMIDOL (ISOVUE-300) INJECTION 61%
125.0000 mL | Freq: Once | INTRAVENOUS | Status: AC | PRN
  Administered 2018-02-05: 125 mL via INTRAVENOUS

## 2018-02-11 ENCOUNTER — Other Ambulatory Visit: Payer: Managed Care, Other (non HMO)

## 2018-02-11 ENCOUNTER — Ambulatory Visit: Payer: Managed Care, Other (non HMO) | Admitting: Oncology

## 2018-02-15 ENCOUNTER — Inpatient Hospital Stay: Payer: Managed Care, Other (non HMO) | Attending: Oncology

## 2018-02-15 ENCOUNTER — Ambulatory Visit (HOSPITAL_COMMUNITY)
Admission: RE | Admit: 2018-02-15 | Discharge: 2018-02-15 | Disposition: A | Discharge status: Home / Self Care | Payer: Managed Care, Other (non HMO) | Source: Ambulatory Visit | Attending: Nurse Practitioner | Admitting: Nurse Practitioner

## 2018-02-15 ENCOUNTER — Encounter: Payer: Self-pay | Admitting: Nurse Practitioner

## 2018-02-15 ENCOUNTER — Inpatient Hospital Stay (HOSPITAL_BASED_OUTPATIENT_CLINIC_OR_DEPARTMENT_OTHER): Payer: Managed Care, Other (non HMO) | Admitting: Nurse Practitioner

## 2018-02-15 VITALS — BP 132/68 | HR 74 | Temp 98.1°F | Resp 18 | Ht 74.0 in | Wt 295.4 lb

## 2018-02-15 DIAGNOSIS — I10 Essential (primary) hypertension: Secondary | ICD-10-CM | POA: Insufficient documentation

## 2018-02-15 DIAGNOSIS — R600 Localized edema: Secondary | ICD-10-CM | POA: Insufficient documentation

## 2018-02-15 DIAGNOSIS — C186 Malignant neoplasm of descending colon: Secondary | ICD-10-CM | POA: Diagnosis not present

## 2018-02-15 DIAGNOSIS — Z803 Family history of malignant neoplasm of breast: Secondary | ICD-10-CM

## 2018-02-15 DIAGNOSIS — M109 Gout, unspecified: Secondary | ICD-10-CM

## 2018-02-15 DIAGNOSIS — F329 Major depressive disorder, single episode, unspecified: Secondary | ICD-10-CM

## 2018-02-15 DIAGNOSIS — Z85038 Personal history of other malignant neoplasm of large intestine: Secondary | ICD-10-CM

## 2018-02-15 DIAGNOSIS — Z8041 Family history of malignant neoplasm of ovary: Secondary | ICD-10-CM | POA: Insufficient documentation

## 2018-02-15 LAB — CEA (IN HOUSE-CHCC): CEA (CHCC-In House): 4.86 ng/mL (ref 0.00–5.00)

## 2018-02-15 NOTE — Progress Notes (Addendum)
  [place] OFFICE PROGRESS NOTE   Diagnosis: Colon cancer  INTERVAL HISTORY:   [name] returns as scheduled.  No change in bowel habits.  No bloody or black stools.  No abdominal pain.  He has good appetite.  He reports a gout flare involving the left foot 2 or 3 days ago.  He is taking "cherry tablets".  He has noted no improvement.  Objective:  Vital signs in last 24 hours:  Blood pressure 132/68, pulse 74, temperature 98.1 F (36.7 C), temperature source Oral, resp. rate 18, height '6\' 2"'$  (1.88 m), weight 295 lb 6.4 oz (134 kg), SpO2 98 %.    HEENT: Neck without mass. Lymphatics: No palpable cervical, supraclavicular, axillary or inguinal lymph nodes. Resp: Lungs clear bilaterally. Cardio: Regular rate and rhythm. GI: Abdomen soft and nontender.  No hepatomegaly. Vascular: Trace edema, warmth, mild erythema left leg below the knee.    Lab Results:  Lab Results  Component Value Date   WBC 11.0 (H) 01/24/2017   HGB 9.0 (L) 01/24/2017   HCT 28.7 (L) 01/24/2017   MCV 82.7 01/24/2017   PLT 209 01/24/2017   NEUTROABS 8.7 (H) 01/16/2017    Imaging:  No results found.  Medications: I have reviewed the patient's current medications.  Assessment/Plan: 1. Adenocarcinoma of the descending colon, stage II (T3 N0), status post a left colectomy 01/20/2017 ? MSI-stable, no loss of mismatch repair protein expression ? Elevated preoperative CEA ? Incomplete preoperative colonoscopy ? Colonoscopy 08/07/2017-multiple polyps removed including tubular adenomas ? CT abdomen/pelvis 02/05/2018-no evidence of metastatic disease.  2. Enlargement of the right testicle-hydrocele-Testicular ultrasound 02/26/2017-negative for mass, small bilateral hydroceles  3. Hypertension  4. Depression  5. Family history of breast and ovarian cancer, negative genetic testing  6.  Left leg edema, pain.    Disposition: [name] remains in clinical remission from colon  cancer.  We will follow-up on the CEA from today.    He reports a "gout flare" involving the left foot.  There is a significant associated edema.  We are referring him for a venous Doppler to rule out DVT.  We are tentatively scheduling him for follow-up with a CEA in 6 months.  This will be adjusted accordingly pending the Doppler results.  Patient seen with Dr. Benay Spice.  25 minutes were spent face-to-face at today's visit with the majority of that time involved in counseling/coordination of care.   [name] ANP/GNP-BC   02/15/2018  3:16 PM  This was a shared visit with [name].  [name] was interviewed and examined.  He is in clinical remission from colon cancer.  He has edema and erythema of the left lower leg and foot.  It is unclear whether this represents a gout flare or possibly venous thrombosis.  He will be referred for a Doppler of the left leg today.  We will recommend he follow-up with his primary physician if the Doppler is negative.  Julieanne Manson, MD

## 2018-02-15 NOTE — Progress Notes (Signed)
Left lower extremity venous duplex completed. There is no evidence of DVT, ssuperficial thrombosis, or Baker's cyst. Toma Copier, RVS 02/15/2018 6:00PM

## 2018-02-16 ENCOUNTER — Telehealth: Payer: Self-pay | Admitting: Emergency Medicine

## 2018-02-16 ENCOUNTER — Telehealth: Payer: Self-pay | Admitting: Nurse Practitioner

## 2018-02-16 NOTE — Telephone Encounter (Addendum)
Left VM for patient to return call regarding this note.   ----- Message from Owens Shark, NP sent at 02/16/2018  8:31 AM EDT ----- Please let him know CEA is in the upper normal range. Plan repeat in 2 months.

## 2018-02-16 NOTE — Telephone Encounter (Signed)
Spoke with patients wife. Lab results given and next lab appt date / time. Pt wife verbalized understanding.

## 2018-02-16 NOTE — Telephone Encounter (Signed)
Scheduled appt per 4/1 los & 4/2 sch message  - sent reminder letter in the mail with appt date and time.

## 2018-04-19 ENCOUNTER — Inpatient Hospital Stay: Payer: Managed Care, Other (non HMO) | Attending: Oncology

## 2018-04-19 DIAGNOSIS — C186 Malignant neoplasm of descending colon: Secondary | ICD-10-CM | POA: Diagnosis not present

## 2018-04-19 LAB — CEA (IN HOUSE-CHCC): CEA (CHCC-In House): 8.69 ng/mL — ABNORMAL HIGH (ref 0.00–5.00)

## 2018-04-20 ENCOUNTER — Telehealth: Payer: Self-pay | Admitting: Emergency Medicine

## 2018-04-20 NOTE — Telephone Encounter (Addendum)
Pt wife verbalized understanding of this note. Scheduling message sent for lab appt on 05/17/18 @ 3pm.   ----- Message from Ladell Pier, MD sent at 04/19/2018  5:42 PM EDT ----- Please call patient, the CEA is mildly elevated, repeat 1 month if still elevated will schedule office visit for exam and discuss obtaining a PET scan

## 2018-05-17 ENCOUNTER — Inpatient Hospital Stay: Payer: Managed Care, Other (non HMO) | Attending: Oncology

## 2018-05-17 DIAGNOSIS — Z85038 Personal history of other malignant neoplasm of large intestine: Secondary | ICD-10-CM | POA: Insufficient documentation

## 2018-05-17 DIAGNOSIS — C186 Malignant neoplasm of descending colon: Secondary | ICD-10-CM

## 2018-05-18 LAB — CEA (IN HOUSE-CHCC): CEA (CHCC-In House): 7.6 ng/mL — ABNORMAL HIGH (ref 0.00–5.00)

## 2018-05-21 ENCOUNTER — Telehealth: Payer: Self-pay | Admitting: Oncology

## 2018-05-21 NOTE — Telephone Encounter (Signed)
Scheduled appt per 7/3 sch message - pt is aware of appt date and time.

## 2018-06-21 ENCOUNTER — Other Ambulatory Visit: Payer: Self-pay

## 2018-06-21 ENCOUNTER — Inpatient Hospital Stay: Payer: Medicare Other | Attending: Oncology

## 2018-06-21 ENCOUNTER — Other Ambulatory Visit: Payer: Self-pay | Admitting: Oncology

## 2018-06-21 DIAGNOSIS — C186 Malignant neoplasm of descending colon: Secondary | ICD-10-CM

## 2018-06-21 DIAGNOSIS — I10 Essential (primary) hypertension: Secondary | ICD-10-CM | POA: Diagnosis not present

## 2018-06-21 DIAGNOSIS — R970 Elevated carcinoembryonic antigen [CEA]: Secondary | ICD-10-CM | POA: Diagnosis not present

## 2018-06-21 DIAGNOSIS — L988 Other specified disorders of the skin and subcutaneous tissue: Secondary | ICD-10-CM | POA: Diagnosis not present

## 2018-06-21 DIAGNOSIS — F329 Major depressive disorder, single episode, unspecified: Secondary | ICD-10-CM | POA: Insufficient documentation

## 2018-06-21 DIAGNOSIS — M79604 Pain in right leg: Secondary | ICD-10-CM | POA: Diagnosis not present

## 2018-06-21 DIAGNOSIS — Z803 Family history of malignant neoplasm of breast: Secondary | ICD-10-CM | POA: Diagnosis not present

## 2018-06-21 DIAGNOSIS — R600 Localized edema: Secondary | ICD-10-CM | POA: Insufficient documentation

## 2018-06-21 DIAGNOSIS — Z85038 Personal history of other malignant neoplasm of large intestine: Secondary | ICD-10-CM | POA: Diagnosis not present

## 2018-06-21 DIAGNOSIS — Z8041 Family history of malignant neoplasm of ovary: Secondary | ICD-10-CM | POA: Insufficient documentation

## 2018-06-21 DIAGNOSIS — R351 Nocturia: Secondary | ICD-10-CM | POA: Diagnosis not present

## 2018-06-21 LAB — CEA (IN HOUSE-CHCC): CEA (CHCC-In House): 24.67 ng/mL — ABNORMAL HIGH (ref 0.00–5.00)

## 2018-06-22 ENCOUNTER — Other Ambulatory Visit: Payer: Self-pay | Admitting: *Deleted

## 2018-06-22 DIAGNOSIS — C186 Malignant neoplasm of descending colon: Secondary | ICD-10-CM

## 2018-06-23 ENCOUNTER — Inpatient Hospital Stay: Payer: Medicare Other

## 2018-06-23 DIAGNOSIS — L988 Other specified disorders of the skin and subcutaneous tissue: Secondary | ICD-10-CM | POA: Diagnosis not present

## 2018-06-23 DIAGNOSIS — I10 Essential (primary) hypertension: Secondary | ICD-10-CM | POA: Diagnosis not present

## 2018-06-23 DIAGNOSIS — Z85038 Personal history of other malignant neoplasm of large intestine: Secondary | ICD-10-CM | POA: Diagnosis not present

## 2018-06-23 DIAGNOSIS — R970 Elevated carcinoembryonic antigen [CEA]: Secondary | ICD-10-CM | POA: Diagnosis not present

## 2018-06-23 DIAGNOSIS — M79604 Pain in right leg: Secondary | ICD-10-CM | POA: Diagnosis not present

## 2018-06-23 DIAGNOSIS — C186 Malignant neoplasm of descending colon: Secondary | ICD-10-CM

## 2018-06-23 DIAGNOSIS — R600 Localized edema: Secondary | ICD-10-CM | POA: Diagnosis not present

## 2018-06-23 LAB — CBC WITH DIFFERENTIAL (CANCER CENTER ONLY)
Basophils Absolute: 0.1 10*3/uL (ref 0.0–0.1)
Basophils Relative: 1 %
Eosinophils Absolute: 0.4 10*3/uL (ref 0.0–0.5)
Eosinophils Relative: 4 %
HCT: 38.5 % (ref 38.4–49.9)
Hemoglobin: 12.7 g/dL — ABNORMAL LOW (ref 13.0–17.1)
Lymphocytes Relative: 14 %
Lymphs Abs: 1.6 10*3/uL (ref 0.9–3.3)
MCH: 28.3 pg (ref 27.2–33.4)
MCHC: 32.9 g/dL (ref 32.0–36.0)
MCV: 85.8 fL (ref 79.3–98.0)
Monocytes Absolute: 0.8 10*3/uL (ref 0.1–0.9)
Monocytes Relative: 6 %
Neutro Abs: 9 10*3/uL — ABNORMAL HIGH (ref 1.5–6.5)
Neutrophils Relative %: 75 %
Platelet Count: 188 10*3/uL (ref 140–400)
RBC: 4.49 MIL/uL (ref 4.20–5.82)
RDW: 14.5 % (ref 11.0–14.6)
WBC Count: 11.9 10*3/uL — ABNORMAL HIGH (ref 4.0–10.3)

## 2018-06-23 LAB — CMP (CANCER CENTER ONLY)
ALT: 22 U/L (ref 0–44)
AST: 17 U/L (ref 15–41)
Albumin: 3.3 g/dL — ABNORMAL LOW (ref 3.5–5.0)
Alkaline Phosphatase: 87 U/L (ref 38–126)
Anion gap: 12 (ref 5–15)
BUN: 21 mg/dL (ref 8–23)
CO2: 30 mmol/L (ref 22–32)
Calcium: 9.4 mg/dL (ref 8.9–10.3)
Chloride: 95 mmol/L — ABNORMAL LOW (ref 98–111)
Creatinine: 1.06 mg/dL (ref 0.61–1.24)
GFR, Est AFR Am: >60 mL/min (ref >60)
GFR, Estimated: >60 mL/min (ref >60)
Glucose, Bld: 256 mg/dL — ABNORMAL HIGH (ref 70–99)
Potassium: 3.4 mmol/L — ABNORMAL LOW (ref 3.5–5.1)
Sodium: 137 mmol/L (ref 135–145)
Total Bilirubin: 0.3 mg/dL (ref 0.3–1.2)
Total Protein: 7.3 g/dL (ref 6.5–8.1)

## 2018-06-24 ENCOUNTER — Ambulatory Visit (HOSPITAL_COMMUNITY): Admission: RE | Admit: 2018-06-24 | Payer: Managed Care, Other (non HMO) | Source: Ambulatory Visit

## 2018-06-29 ENCOUNTER — Encounter (HOSPITAL_COMMUNITY): Payer: Self-pay

## 2018-06-29 ENCOUNTER — Ambulatory Visit (HOSPITAL_COMMUNITY)
Admission: RE | Admit: 2018-06-29 | Discharge: 2018-06-29 | Disposition: A | Discharge status: Home / Self Care | Payer: Medicare Other | Source: Ambulatory Visit | Attending: Oncology | Admitting: Oncology

## 2018-06-29 DIAGNOSIS — N400 Enlarged prostate without lower urinary tract symptoms: Secondary | ICD-10-CM | POA: Insufficient documentation

## 2018-06-29 DIAGNOSIS — K5730 Diverticulosis of large intestine without perforation or abscess without bleeding: Secondary | ICD-10-CM | POA: Diagnosis not present

## 2018-06-29 DIAGNOSIS — M47816 Spondylosis without myelopathy or radiculopathy, lumbar region: Secondary | ICD-10-CM | POA: Diagnosis not present

## 2018-06-29 DIAGNOSIS — M5136 Other intervertebral disc degeneration, lumbar region: Secondary | ICD-10-CM | POA: Diagnosis not present

## 2018-06-29 DIAGNOSIS — I700 Atherosclerosis of aorta: Secondary | ICD-10-CM | POA: Insufficient documentation

## 2018-06-29 DIAGNOSIS — C189 Malignant neoplasm of colon, unspecified: Secondary | ICD-10-CM | POA: Diagnosis not present

## 2018-06-29 DIAGNOSIS — R590 Localized enlarged lymph nodes: Secondary | ICD-10-CM | POA: Insufficient documentation

## 2018-06-29 DIAGNOSIS — C186 Malignant neoplasm of descending colon: Secondary | ICD-10-CM | POA: Insufficient documentation

## 2018-06-29 MED ORDER — IOHEXOL 300 MG/ML  SOLN
100.0000 mL | Freq: Once | INTRAMUSCULAR | Status: AC | PRN
  Administered 2018-06-29: 100 mL via INTRAVENOUS

## 2018-06-30 ENCOUNTER — Inpatient Hospital Stay (HOSPITAL_BASED_OUTPATIENT_CLINIC_OR_DEPARTMENT_OTHER): Payer: Medicare Other | Admitting: Oncology

## 2018-06-30 ENCOUNTER — Telehealth: Payer: Self-pay

## 2018-06-30 VITALS — BP 172/86 | HR 63 | Temp 98.8°F | Resp 17 | Ht 74.0 in | Wt 302.3 lb

## 2018-06-30 DIAGNOSIS — Z803 Family history of malignant neoplasm of breast: Secondary | ICD-10-CM

## 2018-06-30 DIAGNOSIS — R351 Nocturia: Secondary | ICD-10-CM

## 2018-06-30 DIAGNOSIS — I10 Essential (primary) hypertension: Secondary | ICD-10-CM

## 2018-06-30 DIAGNOSIS — R970 Elevated carcinoembryonic antigen [CEA]: Secondary | ICD-10-CM | POA: Diagnosis not present

## 2018-06-30 DIAGNOSIS — C186 Malignant neoplasm of descending colon: Secondary | ICD-10-CM

## 2018-06-30 DIAGNOSIS — F329 Major depressive disorder, single episode, unspecified: Secondary | ICD-10-CM

## 2018-06-30 DIAGNOSIS — L988 Other specified disorders of the skin and subcutaneous tissue: Secondary | ICD-10-CM | POA: Diagnosis not present

## 2018-06-30 DIAGNOSIS — Z8041 Family history of malignant neoplasm of ovary: Secondary | ICD-10-CM | POA: Diagnosis not present

## 2018-06-30 DIAGNOSIS — R600 Localized edema: Secondary | ICD-10-CM | POA: Diagnosis not present

## 2018-06-30 DIAGNOSIS — Z85038 Personal history of other malignant neoplasm of large intestine: Secondary | ICD-10-CM | POA: Diagnosis not present

## 2018-06-30 DIAGNOSIS — M79604 Pain in right leg: Secondary | ICD-10-CM | POA: Diagnosis not present

## 2018-06-30 NOTE — Telephone Encounter (Signed)
Printed avs and calender of upcoming appointment. Per 8/14 los. 

## 2018-06-30 NOTE — Progress Notes (Signed)
[facility] OFFICE PROGRESS NOTE   Diagnosis:Colon  Cancer  INTERVAL HISTORY:   Jeremy Stevenson was noted to have a persistently elevated CEA on 05/17/2018.  The CEA was higher on 06/21/2018.  He was referred for CTs 06/29/2018.  There is no evidence of metastatic disease.  He reports feeling depressed regarding the elevated CEA.  He otherwise feels well.  No difficulty with bowel function.  No dysphasia.  He reports frequent nocturia.  This has been a chronic symptom. He has noted a pigmented lesion at the right lower leg that bleeds when he scratches this area. Objective:  Vital signs in last 24 hours:  Blood pressure (!) 172/86, pulse 63, temperature 98.8 F (37.1 C), temperature source Oral, resp. rate 17, height '6\' 2"'  (1.88 m), weight (!) 302 lb 4.8 oz (137.1 kg), SpO2 99 %.    HEENT: Neck without mass Lymphatics: No cervical, supraclavicular, axillary, or inguinal nodes Resp: Lungs clear bilaterally Cardio: The rate and rhythm GI: No hepatosplenomegaly, no mass, nontender Vascular: The left lower leg is larger than the right side with venous varicosities, no erythema or tenderness GU: Testes without mass, soft fullness in the right scrotum Skin: 0.5 cm hyperpigmented raised lesion at the right pretibial region  Portacath/PICC-without erythema  Lab Results:  Lab Results  Component Value Date   WBC 11.9 (H) 06/23/2018   HGB 12.7 (L) 06/23/2018   HCT 38.5 06/23/2018   MCV 85.8 06/23/2018   PLT 188 06/23/2018   NEUTROABS 9.0 (H) 06/23/2018    CMP  Lab Results  Component Value Date   NA 137 06/23/2018   K 3.4 (L) 06/23/2018   CL 95 (L) 06/23/2018   CO2 30 06/23/2018   GLUCOSE 256 (H) 06/23/2018   BUN 21 06/23/2018   CREATININE 1.06 06/23/2018   CALCIUM 9.4 06/23/2018   PROT 7.3 06/23/2018   ALBUMIN 3.3 (L) 06/23/2018   AST 17 06/23/2018   ALT 22 06/23/2018   ALKPHOS 87 06/23/2018   BILITOT 0.3 06/23/2018   GFRNONAA >60 06/23/2018   GFRAA >60  06/23/2018    Lab Results  Component Value Date   CEA1 24.67 (H) 06/21/2018     Imaging:  Ct Chest W Contrast  Result Date: 06/29/2018 CLINICAL DATA:  Restaging colon cancer. Prior resection. Rising CEA level. EXAM: CT CHEST, ABDOMEN, AND PELVIS WITH CONTRAST TECHNIQUE: Multidetector CT imaging of the chest, abdomen and pelvis was performed following the standard protocol during bolus administration of intravenous contrast. CONTRAST:  152m OMNIPAQUE IOHEXOL 300 MG/ML  SOLN COMPARISON:  02/05/2017 FINDINGS: CT CHEST FINDINGS Cardiovascular: Aortic arch and branch vessel atherosclerotic calcification. Mild mitral valve calcification. Mediastinum/Nodes: Unremarkable Lungs/Pleura: Calcified 5 by 3 mm lingular nodule on image 755/4favoring old granulomatous disease. This has point value measurements up to 346 Hounsfield units. Musculoskeletal: Thoracic spondylosis. Right greater than left degenerative sternoclavicular joint arthropathy. CT ABDOMEN PELVIS FINDINGS Hepatobiliary: Unremarkable Pancreas: Unremarkable Spleen: Unremarkable Adrenals/Urinary Tract: Retained fetal lobularity of the kidneys. 8 mm in diameter hypodense lesion of the left kidney upper pole is technically too small to characterize although statistically likely to be benign. 2 mm left kidney lower pole nonobstructive renal calculus on image 120/5. Stomach/Bowel: Prior sigmoid colectomy. Descending colon diverticulosis with scattered diverticula in the remaining portion of the sigmoid colon. Vascular/Lymphatic: Stable 1.3 cm left and 1.1 cm right external iliac lymph nodes. Small retroperitoneal lymph nodes are not pathologically enlarged by size criteria although are notable in number. Aortoiliac atherosclerotic vascular disease. Reproductive: Mild  prostatomegaly. Other: No supplemental non-categorized findings. Musculoskeletal: Fatty bilateral spermatic cord, stable. Degenerative disc disease at all levels between L2 and S1 with  foraminal impingement on the right at L4-5. Small umbilical hernia contains adipose tissue. IMPRESSION: 1. There continues to be mild bilateral external iliac adenopathy which is relatively chronic and likely incidental. No findings of active malignancy or metastatic disease. 2. Other imaging findings of potential clinical significance: Aortic Atherosclerosis (ICD10-I70.0). Mitral valve calcification. Degenerative right sternoclavicular joint. Descending colon diverticulosis. Mild prostatomegaly. Right foraminal impingement at L4-5 due to spondylosis and degenerative disc disease. Electronically Signed   By: Jeremy Stevenson M.D.   On: 06/29/2018 15:59   Ct Abdomen Pelvis W Contrast  Result Date: 06/29/2018 CLINICAL DATA:  Restaging colon cancer. Prior resection. Rising CEA level. EXAM: CT CHEST, ABDOMEN, AND PELVIS WITH CONTRAST TECHNIQUE: Multidetector CT imaging of the chest, abdomen and pelvis was performed following the standard protocol during bolus administration of intravenous contrast. CONTRAST:  174m OMNIPAQUE IOHEXOL 300 MG/ML  SOLN COMPARISON:  02/05/2017 FINDINGS: CT CHEST FINDINGS Cardiovascular: Aortic arch and branch vessel atherosclerotic calcification. Mild mitral valve calcification. Mediastinum/Nodes: Unremarkable Lungs/Pleura: Calcified 5 by 3 mm lingular nodule on image 746/4favoring old granulomatous disease. This has point value measurements up to 346 Hounsfield units. Musculoskeletal: Thoracic spondylosis. Right greater than left degenerative sternoclavicular joint arthropathy. CT ABDOMEN PELVIS FINDINGS Hepatobiliary: Unremarkable Pancreas: Unremarkable Spleen: Unremarkable Adrenals/Urinary Tract: Retained fetal lobularity of the kidneys. 8 mm in diameter hypodense lesion of the left kidney upper pole is technically too small to characterize although statistically likely to be benign. 2 mm left kidney lower pole nonobstructive renal calculus on image 120/5. Stomach/Bowel: Prior  sigmoid colectomy. Descending colon diverticulosis with scattered diverticula in the remaining portion of the sigmoid colon. Vascular/Lymphatic: Stable 1.3 cm left and 1.1 cm right external iliac lymph nodes. Small retroperitoneal lymph nodes are not pathologically enlarged by size criteria although are notable in number. Aortoiliac atherosclerotic vascular disease. Reproductive: Mild prostatomegaly. Other: No supplemental non-categorized findings. Musculoskeletal: Fatty bilateral spermatic cord, stable. Degenerative disc disease at all levels between L2 and S1 with foraminal impingement on the right at L4-5. Small umbilical hernia contains adipose tissue. IMPRESSION: 1. There continues to be mild bilateral external iliac adenopathy which is relatively chronic and likely incidental. No findings of active malignancy or metastatic disease. 2. Other imaging findings of potential clinical significance: Aortic Atherosclerosis (ICD10-I70.0). Mitral valve calcification. Degenerative right sternoclavicular joint. Descending colon diverticulosis. Mild prostatomegaly. Right foraminal impingement at L4-5 due to spondylosis and degenerative disc disease. Electronically Signed   By: WVan ClinesM.D.   On: 06/29/2018 15:59   Images reviewed. Medications: I have reviewed the patient's current medications.   Assessment/Plan: 1. Adenocarcinoma of the descending colon, stage II (T3 N0), status post a left colectomy 01/20/2017 ? MSI-stable, no loss of mismatch repair protein expression ? Elevated preoperative CEA ? Incomplete preoperative colonoscopy ? Colonoscopy 08/07/2017-multiple polyps removed including tubular adenomas ? CT abdomen/pelvis 02/05/2018-no evidence of metastatic disease. ? Elevated CEA June 2019 ? CTs 06/29/2018-no evidence of metastatic disease, stable mild bilateral iliac adenopathy  2. Enlargement of the right testicle-hydrocele-Testicular ultrasound 02/26/2017-negative for mass, small  bilateral hydroceles  3. Hypertension  4. Depression  5. Family history of breast and ovarian cancer, negative genetic testing  6.  Left leg edema, pain.  7.  Pigmented lesion right lower leg-refer to dermatology 06/30/2018    Disposition: Mr. JMartiniquehas a history of colon cancer.  The CEA is elevated.  There  is no clinical or CT evidence of metastatic colon cancer or another malignancy.  He underwent a colonoscopy in September 2018.  I discussed the differential diagnosis with Jeremy Stevenson and his wife.  He understands the elevated CEA may represent subclinical recurrence of colon cancer, another malignancy, or a benign finding.  We decided to obtain a repeat CEA in 2-3 weeks.  If the CEA remains elevated he will be referred for a staging PET scan.  He will return for an office visit in approximately 1 month.  We made a dermatology referral to evaluate the right lower leg lesion.  25 minutes were spent with the patient today.  The majority of the time was used for counseling and coordination of care.  Betsy Coder, MD  06/30/2018  1:25 PM

## 2018-07-12 ENCOUNTER — Inpatient Hospital Stay: Payer: Medicare Other

## 2018-07-12 DIAGNOSIS — I10 Essential (primary) hypertension: Secondary | ICD-10-CM | POA: Diagnosis not present

## 2018-07-12 DIAGNOSIS — M79604 Pain in right leg: Secondary | ICD-10-CM | POA: Diagnosis not present

## 2018-07-12 DIAGNOSIS — Z85038 Personal history of other malignant neoplasm of large intestine: Secondary | ICD-10-CM | POA: Diagnosis not present

## 2018-07-12 DIAGNOSIS — L988 Other specified disorders of the skin and subcutaneous tissue: Secondary | ICD-10-CM | POA: Diagnosis not present

## 2018-07-12 DIAGNOSIS — C186 Malignant neoplasm of descending colon: Secondary | ICD-10-CM

## 2018-07-12 DIAGNOSIS — R970 Elevated carcinoembryonic antigen [CEA]: Secondary | ICD-10-CM | POA: Diagnosis not present

## 2018-07-12 DIAGNOSIS — R600 Localized edema: Secondary | ICD-10-CM | POA: Diagnosis not present

## 2018-07-12 LAB — BASIC METABOLIC PANEL - CANCER CENTER ONLY
Anion gap: 10 (ref 5–15)
BUN: 22 mg/dL (ref 8–23)
CO2: 31 mmol/L (ref 22–32)
Calcium: 10 mg/dL (ref 8.9–10.3)
Chloride: 96 mmol/L — ABNORMAL LOW (ref 98–111)
Creatinine: 1.29 mg/dL — ABNORMAL HIGH (ref 0.61–1.24)
GFR, Est AFR Am: >60 mL/min (ref >60)
GFR, Estimated: 57 mL/min — ABNORMAL LOW (ref >60)
Glucose, Bld: 214 mg/dL — ABNORMAL HIGH (ref 70–99)
Potassium: 3.4 mmol/L — ABNORMAL LOW (ref 3.5–5.1)
Sodium: 137 mmol/L (ref 135–145)

## 2018-07-13 LAB — CEA (IN HOUSE-CHCC): CEA (CHCC-In House): 20.9 ng/mL — ABNORMAL HIGH (ref 0.00–5.00)

## 2018-07-15 ENCOUNTER — Other Ambulatory Visit: Payer: Self-pay | Admitting: Oncology

## 2018-07-15 DIAGNOSIS — C186 Malignant neoplasm of descending colon: Secondary | ICD-10-CM

## 2018-07-26 ENCOUNTER — Ambulatory Visit (HOSPITAL_COMMUNITY): Payer: Managed Care, Other (non HMO)

## 2018-08-03 ENCOUNTER — Ambulatory Visit (HOSPITAL_COMMUNITY)
Admission: RE | Admit: 2018-08-03 | Discharge: 2018-08-03 | Disposition: A | Discharge status: Home / Self Care | Payer: Managed Care, Other (non HMO) | Source: Ambulatory Visit | Attending: Oncology | Admitting: Oncology

## 2018-08-03 DIAGNOSIS — C787 Secondary malignant neoplasm of liver and intrahepatic bile duct: Secondary | ICD-10-CM | POA: Diagnosis not present

## 2018-08-03 DIAGNOSIS — I700 Atherosclerosis of aorta: Secondary | ICD-10-CM | POA: Diagnosis not present

## 2018-08-03 DIAGNOSIS — C186 Malignant neoplasm of descending colon: Secondary | ICD-10-CM | POA: Diagnosis not present

## 2018-08-03 DIAGNOSIS — K760 Fatty (change of) liver, not elsewhere classified: Secondary | ICD-10-CM | POA: Insufficient documentation

## 2018-08-03 DIAGNOSIS — I2510 Atherosclerotic heart disease of native coronary artery without angina pectoris: Secondary | ICD-10-CM | POA: Diagnosis not present

## 2018-08-03 DIAGNOSIS — N400 Enlarged prostate without lower urinary tract symptoms: Secondary | ICD-10-CM | POA: Diagnosis not present

## 2018-08-03 MED ORDER — FLUDEOXYGLUCOSE F - 18 (FDG) INJECTION
16.1300 | Freq: Once | INTRAVENOUS | Status: AC | PRN
  Administered 2018-08-03: 16.13 via INTRAVENOUS

## 2018-08-04 ENCOUNTER — Encounter: Payer: Self-pay | Admitting: Nurse Practitioner

## 2018-08-04 ENCOUNTER — Inpatient Hospital Stay: Payer: Managed Care, Other (non HMO) | Attending: Oncology | Admitting: Nurse Practitioner

## 2018-08-04 VITALS — BP 153/90 | HR 99 | Temp 98.4°F | Resp 19 | Ht 74.0 in | Wt 303.9 lb

## 2018-08-04 DIAGNOSIS — Z85038 Personal history of other malignant neoplasm of large intestine: Secondary | ICD-10-CM | POA: Diagnosis not present

## 2018-08-04 DIAGNOSIS — I10 Essential (primary) hypertension: Secondary | ICD-10-CM | POA: Diagnosis not present

## 2018-08-04 DIAGNOSIS — Z803 Family history of malignant neoplasm of breast: Secondary | ICD-10-CM | POA: Diagnosis not present

## 2018-08-04 DIAGNOSIS — F329 Major depressive disorder, single episode, unspecified: Secondary | ICD-10-CM | POA: Diagnosis not present

## 2018-08-04 DIAGNOSIS — M79605 Pain in left leg: Secondary | ICD-10-CM | POA: Insufficient documentation

## 2018-08-04 DIAGNOSIS — L989 Disorder of the skin and subcutaneous tissue, unspecified: Secondary | ICD-10-CM | POA: Insufficient documentation

## 2018-08-04 DIAGNOSIS — N433 Hydrocele, unspecified: Secondary | ICD-10-CM | POA: Insufficient documentation

## 2018-08-04 DIAGNOSIS — R600 Localized edema: Secondary | ICD-10-CM | POA: Diagnosis not present

## 2018-08-04 DIAGNOSIS — C787 Secondary malignant neoplasm of liver and intrahepatic bile duct: Secondary | ICD-10-CM

## 2018-08-04 DIAGNOSIS — R970 Elevated carcinoembryonic antigen [CEA]: Secondary | ICD-10-CM | POA: Diagnosis not present

## 2018-08-04 DIAGNOSIS — C186 Malignant neoplasm of descending colon: Secondary | ICD-10-CM

## 2018-08-04 LAB — GLUCOSE, CAPILLARY: Glucose-Capillary: 178 mg/dL — ABNORMAL HIGH (ref 70–99)

## 2018-08-04 NOTE — Progress Notes (Addendum)
[facility] OFFICE PROGRESS NOTE   Diagnosis: Colon cancer  INTERVAL HISTORY:   Jeremy Stevenson returns as scheduled.  He denies pain.  He has a good appetite.  He is anxious regarding the results of the recent PET scan.  Objective:  Vital signs in last 24 hours:  Blood pressure (!) 153/90, pulse 99, temperature 98.4 F (36.9 C), temperature source Oral, resp. rate 19, height _0  (1.88 m), weight (!) 303 lb 14.4 oz (137.8 kg), SpO2 95 %.   HEENT: No obvious mass within the oral cavity or pharynx Resp: Lungs clear bilaterally. Cardio: Regular rate and rhythm. GI: Abdomen soft and nontender.  No hepatomegaly. Vascular: No leg edema. Neuro: Alert and oriented.    Lab Results:  Lab Results  Component Value Date   WBC 11.9 (H) 06/23/2018   HGB 12.7 (L) 06/23/2018   HCT 38.5 06/23/2018   MCV 85.8 06/23/2018   PLT 188 06/23/2018   NEUTROABS 9.0 (H) 06/23/2018    Imaging:  Nm Pet Image Initial (pi) Skull Base To Thigh  Result Date: 08/04/2018 CLINICAL DATA:  Initial treatment strategy for descending colon cancer status post left colectomy 01/20/2017, with rising CEA. EXAM: NUCLEAR MEDICINE PET SKULL BASE TO THIGH TECHNIQUE: 16.1 mCi F-18 FDG was injected intravenously. Full-ring PET imaging was performed from the skull base to thigh after the radiotracer. CT data was obtained and used for attenuation correction and anatomic localization. Fasting blood glucose: 178 mg/dl COMPARISON:  06/29/2018 CT chest, abdomen and pelvis. FINDINGS: Mediastinal blood pool activity: SUV max 3.1 NECK: No hypermetabolic lymph nodes in the neck. There is asymmetric hypermetabolism (max SUV 6.9) in the right palatine tonsil with associated mild asymmetric soft tissue fullness on the CT images (series 4/image 37). Incidental CT findings: none CHEST: No hypermetabolic pulmonary findings. No hypermetabolic axillary, mediastinal or hilar lymph nodes. Incidental CT findings: Right coronary  atherosclerosis. Atherosclerotic nonaneurysmal thoracic aorta. No acute consolidative airspace disease, lung masses or significant pulmonary nodules. ABDOMEN/PELVIS: There is a solitary hypermetabolic central right liver mass measuring approximately 3 cm in size, which is occult on the CT images. No abnormal hypermetabolic activity within the pancreas, adrenal glands, or spleen. No hypermetabolic lymph nodes in the abdomen or pelvis. Incidental CT findings: Diffuse hepatic steatosis. Punctate nonobstructing left nephrolithiasis. Atherosclerotic nonaneurysmal abdominal aorta. Stable postsurgical changes from subtotal left colectomy with moderate diverticulosis in the remnant large-bowel. Mildly enlarged prostate with nonspecific internal prostatic calcifications. SKELETON: No focal hypermetabolic activity to suggest skeletal metastasis. Incidental CT findings: none IMPRESSION: 1. Solitary hypermetabolic central right liver metastasis, which is occult on the CT images. MRI abdomen without and with IV contrast may be useful for further delineation, as clinically warranted. 2. No additional sites of hypermetabolic metastatic disease. 3. Nonspecific asymmetric hypermetabolism in the right palatine tonsil with associated mild asymmetric soft tissue fullness. Recommend correlation with direct visualization to exclude neoplasm. 4. Chronic findings include: Aortic Atherosclerosis (ICD10-I70.0). Coronary atherosclerosis. Diffuse hepatic steatosis. Mildly enlarged prostate. Electronically Signed   By: Ilona Sorrel M.D.   On: 08/04/2018 09:02    Medications: I have reviewed the patient's current medications.  Assessment/Plan: 1. Adenocarcinoma of the descending colon, stage II (T3 N0), status post a left colectomy 01/20/2017 ? MSI-stable, no loss of mismatch repair protein expression ? Elevated preoperative CEA ? Incomplete preoperative colonoscopy ? Colonoscopy 08/07/2017-multiple polyps removed including tubular  adenomas ? CT abdomen/pelvis 02/05/2018-no evidence of metastatic disease. ? Elevated CEA June 2019 ? CTs 06/29/2018-no evidence of metastatic disease,  stable mild bilateral iliac adenopathy ? PET scan 08/03/2018- solitary hypermetabolic central right liver metastasis; asymmetric hypermetabolism right palatine tonsil with associated mild asymmetric soft tissue fullness on CT images  2. Enlargement of the right testicle-hydrocele-Testicular ultrasound 02/26/2017-negative for mass, small bilateral hydroceles  3. Hypertension  4. Depression  5. Family history of breast and ovarian cancer, negative genetic testing  6.Left leg edema, pain.  7.  Pigmented lesion right lower leg-refer to dermatology 06/30/2018   Disposition: Jeremy Stevenson has a history of stage II colon cancer.  The CEA is elevated.  Recent PET scan shows a solitary liver lesion and asymmetric hypermetabolism at the right tonsil.  Dr. Benay Spice reviewed the PET scan report/images with Jeremy Stevenson and his wife at today's visit.  We are referring him for an MRI of the liver.  His case will be presented at the upcoming tumor conference.  We made a referral to Dr. Barry Dienes.  He will return for a follow-up visit on 08/19/2018 for further discussion.  Patient seen with Dr. Benay Spice.    [name] ANP/GNP-BC   08/04/2018  3:29 PM  This was a shared visit with [name].  Jeremy Stevenson was interviewed and examined. There is no evidence of a rt. Tonsil mass on exam today.  He appears to have an isolated liver metastasis.  We will refer him for an MRI and consult surgery. I will present his case at the GI  Tumor conference.  We reviewed the PET images with Jeremy Stevenson.  Julieanne Manson, MD

## 2018-08-06 ENCOUNTER — Telehealth: Payer: Self-pay | Admitting: Emergency Medicine

## 2018-08-06 NOTE — Telephone Encounter (Signed)
Spoke with Dr.Goulds office who states they have made contact with the patient to schedule. They state the patient told them he would call back to schedule when he was close to his calender.

## 2018-08-11 ENCOUNTER — Ambulatory Visit (HOSPITAL_COMMUNITY): Admission: RE | Admit: 2018-08-11 | Payer: Medicare Other | Source: Ambulatory Visit

## 2018-08-19 ENCOUNTER — Other Ambulatory Visit: Payer: Self-pay | Admitting: Nurse Practitioner

## 2018-08-19 ENCOUNTER — Telehealth: Payer: Self-pay | Admitting: Oncology

## 2018-08-19 ENCOUNTER — Inpatient Hospital Stay: Payer: Managed Care, Other (non HMO)

## 2018-08-19 ENCOUNTER — Inpatient Hospital Stay: Payer: Managed Care, Other (non HMO) | Admitting: Oncology

## 2018-08-19 ENCOUNTER — Ambulatory Visit (HOSPITAL_COMMUNITY)
Admission: RE | Admit: 2018-08-19 | Discharge: 2018-08-19 | Disposition: A | Discharge status: Home / Self Care | Payer: Medicare Other | Source: Ambulatory Visit | Attending: Nurse Practitioner | Admitting: Nurse Practitioner

## 2018-08-19 ENCOUNTER — Encounter (HOSPITAL_COMMUNITY): Payer: Self-pay

## 2018-08-19 DIAGNOSIS — C186 Malignant neoplasm of descending colon: Secondary | ICD-10-CM

## 2018-08-19 MED ORDER — GADOBUTROL 1 MMOL/ML IV SOLN: 10.0000 mL | Freq: Once | INTRAVENOUS | Status: DC | PRN

## 2018-08-19 NOTE — Telephone Encounter (Signed)
Scheduled appt per 10/3 sch message - pt is aware of appt date and time   

## 2018-08-19 NOTE — Progress Notes (Signed)
Attempted patient for MRI. Due to body habitus, patient needs wide bore scanner. Patient was told to call MD's office to get scheduled at [facility]  [email or web address]

## 2018-08-20 DIAGNOSIS — Z6839 Body mass index (BMI) 39.0-39.9, adult: Secondary | ICD-10-CM | POA: Diagnosis not present

## 2018-08-20 DIAGNOSIS — C787 Secondary malignant neoplasm of liver and intrahepatic bile duct: Secondary | ICD-10-CM | POA: Diagnosis not present

## 2018-08-20 DIAGNOSIS — C189 Malignant neoplasm of colon, unspecified: Secondary | ICD-10-CM | POA: Diagnosis not present

## 2018-08-25 ENCOUNTER — Other Ambulatory Visit: Payer: Self-pay | Admitting: Nurse Practitioner

## 2018-08-25 ENCOUNTER — Ambulatory Visit (HOSPITAL_COMMUNITY)
Admission: RE | Admit: 2018-08-25 | Discharge: 2018-08-25 | Disposition: A | Discharge status: Home / Self Care | Payer: Medicare Other | Source: Ambulatory Visit | Attending: Nurse Practitioner | Admitting: Nurse Practitioner

## 2018-08-25 DIAGNOSIS — C189 Malignant neoplasm of colon, unspecified: Secondary | ICD-10-CM | POA: Insufficient documentation

## 2018-08-25 DIAGNOSIS — R160 Hepatomegaly, not elsewhere classified: Secondary | ICD-10-CM | POA: Insufficient documentation

## 2018-08-25 DIAGNOSIS — K7689 Other specified diseases of liver: Secondary | ICD-10-CM | POA: Diagnosis not present

## 2018-08-25 LAB — POCT I-STAT CREATININE: Creatinine, Ser: 1 mg/dL (ref 0.61–1.24)

## 2018-08-25 MED ORDER — GADOBUTROL 1 MMOL/ML IV SOLN
10.0000 mL | Freq: Once | INTRAVENOUS | Status: AC | PRN
  Administered 2018-08-25: 10 mL via INTRAVENOUS

## 2018-08-27 ENCOUNTER — Telehealth: Payer: Self-pay | Admitting: Oncology

## 2018-08-27 ENCOUNTER — Inpatient Hospital Stay: Payer: Managed Care, Other (non HMO) | Attending: Oncology | Admitting: Oncology

## 2018-08-27 VITALS — BP 142/88 | HR 60 | Temp 97.8°F | Resp 19 | Ht 74.0 in | Wt 304.6 lb

## 2018-08-27 DIAGNOSIS — F329 Major depressive disorder, single episode, unspecified: Secondary | ICD-10-CM | POA: Diagnosis not present

## 2018-08-27 DIAGNOSIS — N433 Hydrocele, unspecified: Secondary | ICD-10-CM | POA: Diagnosis not present

## 2018-08-27 DIAGNOSIS — R970 Elevated carcinoembryonic antigen [CEA]: Secondary | ICD-10-CM | POA: Insufficient documentation

## 2018-08-27 DIAGNOSIS — I10 Essential (primary) hypertension: Secondary | ICD-10-CM

## 2018-08-27 DIAGNOSIS — Z8041 Family history of malignant neoplasm of ovary: Secondary | ICD-10-CM | POA: Insufficient documentation

## 2018-08-27 DIAGNOSIS — C186 Malignant neoplasm of descending colon: Secondary | ICD-10-CM

## 2018-08-27 DIAGNOSIS — C787 Secondary malignant neoplasm of liver and intrahepatic bile duct: Secondary | ICD-10-CM

## 2018-08-27 DIAGNOSIS — Z803 Family history of malignant neoplasm of breast: Secondary | ICD-10-CM | POA: Diagnosis not present

## 2018-08-27 DIAGNOSIS — Z85038 Personal history of other malignant neoplasm of large intestine: Secondary | ICD-10-CM | POA: Diagnosis not present

## 2018-08-27 NOTE — Progress Notes (Signed)
[facility] OFFICE PROGRESS NOTE   Diagnosis: Colon cancer  INTERVAL HISTORY:   Mr. Martinique returns as scheduled.  He feels well.  No new complaint.  He saw Dr. Barry Dienes to consider resection of the isolated liver lesion.  She recommends ablation.  Objective:  Vital signs in last 24 hours:  Blood pressure (!) 142/88, pulse 60, temperature 97.8 F (36.6 C), temperature source Oral, resp. rate 19, height _0  (1.88 m), weight (!) 304 lb 9.6 oz (138.2 kg), SpO2 98 %.    HEENT: Neck without mass Lymphatics: No cervical, supraclavicular, axillary, or inguinal nodes Resp: Lungs clear bilaterally Cardio: Regular rate and rhythm GI: No hepatosplenomegaly, no mass, nontender Vascular: No leg edema   Lab Results:  Lab Results  Component Value Date   WBC 11.9 (H) 06/23/2018   HGB 12.7 (L) 06/23/2018   HCT 38.5 06/23/2018   MCV 85.8 06/23/2018   PLT 188 06/23/2018   NEUTROABS 9.0 (H) 06/23/2018    CMP  Lab Results  Component Value Date   NA 137 07/12/2018   K 3.4 (L) 07/12/2018   CL 96 (L) 07/12/2018   CO2 31 07/12/2018   GLUCOSE 214 (H) 07/12/2018   BUN 22 07/12/2018   CREATININE 1.00 08/25/2018   CALCIUM 10.0 07/12/2018   PROT 7.3 06/23/2018   ALBUMIN 3.3 (L) 06/23/2018   AST 17 06/23/2018   ALT 22 06/23/2018   ALKPHOS 87 06/23/2018   BILITOT 0.3 06/23/2018   GFRNONAA 57 (L) 07/12/2018   GFRAA >60 07/12/2018    Lab Results  Component Value Date   CEA1 20.90 (H) 07/12/2018     Imaging:  Mr Liver W Wo Contrast  Result Date: 08/25/2018 CLINICAL DATA:  Colon carcinoma. Rising CEA level. Hypermetabolic focus in liver on recent PET. EXAM: MRI ABDOMEN WITHOUT AND WITH CONTRAST TECHNIQUE: Multiplanar multisequence MR imaging of the abdomen was performed both before and after the administration of intravenous contrast. CONTRAST:  10 mL Gadavist COMPARISON:  PET-CT on 08/03/2018 and CT on 06/29/2018 FINDINGS: Lower chest: No acute findings.  Hepatobiliary: Image degradation by respiratory motion artifact noted. Moderate diffuse hepatic steatosis. A heterogeneous hypovascular mass is seen in the central right hepatic lobe measuring 2.6 x 2.3 cm on image 65/1304. This shows restricted diffusion and corresponds with the hypermetabolic focus on recent PET-CT. This is consistent with a liver metastasis. No other definite liver metastases identified. Gallbladder is unremarkable. No evidence of biliary ductal dilatation. Pancreas:  No mass or inflammatory changes. Spleen:  Within normal limits in size and appearance. Adrenals/Urinary Tract: No masses identified. Bilateral renal parenchymal scarring and several tiny cysts noted. No evidence of hydronephrosis. Stomach/Bowel: Visualized portion unremarkable. Vascular/Lymphatic: No pathologically enlarged lymph nodes identified. No abdominal aortic aneurysm. Other:  None. Musculoskeletal:  No suspicious bone lesions identified. IMPRESSION: 2.6 cm hypovascular mass in central right hepatic lobe, consistent with liver metastasis. No other sites of abdominal metastatic disease identified. Electronically Signed   By: Earle Gell M.D.   On: 08/25/2018 14:57    Medications: I have reviewed the patient's current medications.   1. AssAdenocarcinoma of the descending colon, stage II (T3 N0), status post a left colectomy 01/20/2017 ? MSI-stable, no loss of mismatch repair protein expression ? Elevated preoperative CEA ? Incomplete preoperative colonoscopy ? Colonoscopy 08/07/2017-multiple polyps removed including tubular adenomas ? CT abdomen/pelvis 02/05/2018-no evidence of metastatic disease. ? Elevated CEA June 2019 ? CTs 06/29/2018-no evidence of metastatic disease, stable mild bilateral iliac adenopathy ? PET scan  08/03/2018- solitary hypermetabolic central right liver metastasis; asymmetric hypermetabolism right palatine tonsil with associated mild asymmetric soft tissue fullness on CT images ? MRI liver  08/25/2018- 2.6 cm mass in the central right liver consistent with metastasis, no other evidence of metastatic disease  2. Enlargement of the right testicle-hydrocele-Testicular ultrasound 02/26/2017-negative for mass, small bilateral hydroceles  3. Hypertension  4. Depression  5. Family history of breast and ovarian cancer, negative genetic testing  6.Left leg edema, pain.  7.Pigmented lesion right lower leg-refer to dermatology 06/30/2018  essment/Plan:    Disposition: Mr. Martinique has a history of stage II colon cancer dating to March 2018.  The CEA is elevated.  He appears to have an isolated liver metastasis.  Dr. Barry Dienes has evaluated Mr. Martinique.  I discussed the case with her yesterday.  She does not recommend surgery due to the potential for morbidity.  She recommends a referral to interventional radiology to consider ablation.  I will present his case at the GI tumor conference next week.  I made a referral to interventional radiology.  I will see him after the ablation procedure to discuss adjuvant systemic treatment options.  I reviewed the MRI images with Mr. Martinique and his wife.  25 minutes were spent with the patient today.  The majority of the time was used for counseling and coordination of care.  Betsy Coder, MD  08/27/2018  1:04 PM

## 2018-08-27 NOTE — Telephone Encounter (Signed)
Appts scheduled avs/calendar printed per 10/11 los

## 2018-09-07 ENCOUNTER — Ambulatory Visit
Admission: RE | Admit: 2018-09-07 | Discharge: 2018-09-07 | Disposition: A | Discharge status: Home / Self Care | Payer: Medicare Other | Source: Ambulatory Visit | Attending: Oncology | Admitting: Oncology

## 2018-09-07 ENCOUNTER — Encounter: Payer: Self-pay | Admitting: Radiology

## 2018-09-07 DIAGNOSIS — C186 Malignant neoplasm of descending colon: Secondary | ICD-10-CM

## 2018-09-07 HISTORY — PX: IR RADIOLOGIST EVAL & MGMT: IMG5224

## 2018-09-07 NOTE — Consult Note (Signed)
Chief Complaint: Liver lesion, possible metastasis  Referring Physician(s): Jeremy Stevenson   History of Present Illness: Jeremy Stevenson is a 67 y.o. male presenting as a scheduled consultation to [facility] today, kindly referred by Dr. Julieanne Stevenson, for evaluation of a single liver lesion, presumed metastasis, and candidacy for any liver directed therapy, possible biopsy.    Jeremy Stevenson is here today with his wife for the interview.   He is a gentleman who was diagnosed February of 2018 with a left colon cancer.  This was discovered during a work-up of lower GI bleeding. Jeremy Stevenson performed colonoscopy 01/02/2017, with mass discovered and biopsy performed.  Subsequently the patient has surgery with Jeremy Stevenson on 01/20/2017 with left colectomy.    History: ________________________________ Pathology report 01/20/2017: Colon:  - Invasive adenocarcinoma, moderately differentiated spanning 6.8cm - invades pericolonic tissue - resection margins negative -heterotopic ossification -20 of 20 nodes negative for malignancy (0/20) - MSI -stable  Initial staging was stage II (T3 N0).  No chemotherapy.    Baseline CEA 01/16/2017 was 27.0.  Post surgery on 02/13/2017 was 1.67 CEA remained below 5.0 on multiple after surgery, and was noted to be up 04/19/2018, at 8.69, then 06/21/2018 was 24.67.    PET CT 08/03/2018 demonstrated single liver metastasis, and MRI 08/25/2018 confirmed single metastasis.  PET was negative for other sites.    _________________________  Interval: He is feeling fine, and tells me he has no symptoms now.  He is completing his daily tasks, and denies any GI or other symptoms.    ECOG of 0.    His last discussion with Jeremy Stevenson was that there were plans to give chemotherapy with any upcoming treatment.  His case has been reviewed at our multi-disciplinary tumor conference, and he is considered a high risk for a surgical resection.  He was referred for VIR input on candidacy  for ablation.  We spent the majority of the interview discussing ablation.       Past Medical History:  Diagnosis Date  . Genetic testing 03/16/2017   Jeremy. Stevenson underwent genetic counseling and testing for hereditary cancer syndromes on 03/03/2017. His results were negative for mutations in all 46 genes analyzed by [employer]'s Common Hereditary Cancers Panel. Genes analyzed include: APC, ATM, AXIN2, BARD1, BMPR1A, BRCA1, BRCA2, BRIP1, CDH1, CDKN2A, CHEK2, CTNNA1, DICER1, EPCAM, GREM1, HOXB13, KIT, MEN1, MLH1, MSH2, MSH3, MSH6, MUTYH, NBN, NF1, NT  . Hypertension   . 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 COLONOSCOPY N/A 01/02/2017   Procedure: COLONOSCOPY;  Surgeon: Jeremy Stevenson;  Location: [facility];  Service: Endoscopy;  Laterality: N/A;  1000  . COLONOSCOPY N/A 08/07/2017   Procedure: COLONOSCOPY;  Surgeon: Jeremy Stevenson;  Location: [facility]ITE;  Service: Endoscopy;  Laterality: N/A;  1045 - per Lelon Frohlich LM of new time  . HERNIA REPAIR     67 years old  . IR RADIOLOGIST EVAL & MGMT  09/07/2018  . LAPAROSCOPIC SIGMOID COLECTOMY N/A 01/20/2017   Procedure: LAPAROSCOPIC LEFT COLECTOMY, TAKEDOWN SPLENIC FLEXURE, AND BIOPSY OF PERITONEAL NODULE;  Surgeon: Jeremy Stevenson;  Location: [facility] ORS;  Service: General;  Laterality: N/A;  . TONSILLECTOMY     67 years old    Allergies: Codeine  Medications: Prior to Admission medications   Medication Sig Start Date End Date Taking? Authorizing Provider  amLODipine (NORVASC) 5 MG tablet Take 5  mg by mouth at bedtime.  10/05/16  Yes Provider, Historical, Stevenson  chlorthalidone (HYGROTON) 25 MG tablet Take 25 mg by mouth at bedtime.  11/13/16  Yes Provider, Historical, Stevenson  citalopram (CELEXA) 20 MG tablet Take 20 mg by mouth at bedtime.  12/06/16  Yes Provider, Historical, Stevenson  losartan (COZAAR) 100 MG tablet Take 100 mg by mouth at bedtime.  11/29/16  Yes Provider,  Historical, Stevenson  metoprolol (LOPRESSOR) 100 MG tablet Take 100 mg by mouth 2 (two) times daily.  11/28/16  Yes Provider, Historical, Stevenson     Family History  Problem Relation Age of Onset  . Breast cancer Mother 76  . Skin cancer Mother   . Ovarian cancer Sister 19       d.50 due to metastases  . Skin cancer Maternal Uncle   . Skin cancer Cousin 44  . Skin cancer Cousin 21    Social History   Socioeconomic History  . Marital status: Married    Spouse name: Not on file  . Number of children: Not on file  . Years of education: Not on file  . Highest education level: Not on file  Occupational History  . Not on file  Social Needs  . Financial resource strain: Not on file  . Food insecurity:    Worry: Not on file    Inability: Not on file  . Transportation needs:    Medical: Not on file    Non-medical: Not on file  Tobacco Use  . Smoking status: Never Smoker  . Smokeless tobacco: Never Used  Substance and Sexual Activity  . Alcohol use: No  . Drug use: No  . Sexual activity: Yes  Lifestyle  . Physical activity:    Days per week: Not on file    Minutes per session: Not on file  . Stress: Not on file  Relationships  . Social connections:    Talks on phone: Not on file    Gets together: Not on file    Attends religious service: Not on file    Active member of club or organization: Not on file    Attends meetings of clubs or organizations: Not on file    Relationship status: Not on file  Other Topics Concern  . Not on file  Social History Narrative  . Not on file    ECOG Status: 0 - Asymptomatic  Review of Systems: A 12 point ROS discussed and pertinent positives are indicated in the HPI above.  All other systems are negative.  Review of Systems  Vital Signs: Pulse 70   Temp 98.5 F (36.9 C) (Oral)   Resp 18   Ht '6\' 2"'  (1.88 m)   Wt 136.1 kg   SpO2 97%   BMI 38.52 kg/m   Physical Exam General: 67 yo male appearing stated age.  Well-developed,  well-nourished.  No distress. HEENT: Atraumatic, normocephalic.  Conjugate gaze, extra-ocular motor intact. No scleral icterus or scleral injection. No lesions on external ears, nose, lips, or gums.  Oral mucosa moist, pink. Small skin plaques on facial skin.  Neck: Symmetric with no goiter enlargement.  Chest/Lungs:  Symmetric chest with inspiration/expiration.  No labored breathing.  Clear to auscultation with no wheezes, rhonchi, or rales.  Heart:  RRR, with no third heart sounds appreciated. No JVD appreciated.  Abdomen:  Soft, NT/ND, with + bowel sounds.   Genito-urinary: Deferred Neurologic: Alert & Oriented to person, place, and time.   Normal affect and insight.  Appropriate  questions.  Moving all 4 extremities with gross sensory intact.  Pulse Exam: Palpable pedal pulses.    Mallampati Score: 2  Imaging: Jeremy Liver W Wo Contrast  Result Date: 08/25/2018 CLINICAL DATA:  Colon carcinoma. Rising CEA level. Hypermetabolic focus in liver on recent PET. EXAM: MRI ABDOMEN WITHOUT AND WITH CONTRAST TECHNIQUE: Multiplanar multisequence Jeremy imaging of the abdomen was performed both before and after the administration of intravenous contrast. CONTRAST:  10 mL Gadavist COMPARISON:  PET-CT on 08/03/2018 and CT on 06/29/2018 FINDINGS: Lower chest: No acute findings. Hepatobiliary: Image degradation by respiratory motion artifact noted. Moderate diffuse hepatic steatosis. A heterogeneous hypovascular mass is seen in the central right hepatic lobe measuring 2.6 x 2.3 cm on image 65/1304. This shows restricted diffusion and corresponds with the hypermetabolic focus on recent PET-CT. This is consistent with a liver metastasis. No other definite liver metastases identified. Gallbladder is unremarkable. No evidence of biliary ductal dilatation. Pancreas:  No mass or inflammatory changes. Spleen:  Within normal limits in size and appearance. Adrenals/Urinary Tract: No masses identified. Bilateral renal parenchymal  scarring and several tiny cysts noted. No evidence of hydronephrosis. Stomach/Bowel: Visualized portion unremarkable. Vascular/Lymphatic: No pathologically enlarged lymph nodes identified. No abdominal aortic aneurysm. Other:  None. Musculoskeletal:  No suspicious bone lesions identified. IMPRESSION: 2.6 cm hypovascular mass in central right hepatic lobe, consistent with liver metastasis. No other sites of abdominal metastatic disease identified. Electronically Signed   By: Earle Gell M.D.   On: 08/25/2018 14:57   Ir Radiologist Eval & Mgmt  Result Date: 09/07/2018 Please refer to notes tab for details about interventional procedure. (Op Note)   Labs:  CBC: Recent Labs    06/23/18 1448  WBC 11.9*  HGB 12.7*  HCT 38.5  PLT 188    COAGS: No results for input(s): INR, APTT in the last 8760 hours.  BMP: Recent Labs    06/23/18 1448 07/12/18 1515 08/25/18 1223  NA 137 137  --   K 3.4* 3.4*  --   CL 95* 96*  --   CO2 30 31  --   GLUCOSE 256* 214*  --   BUN 21 22  --   CALCIUM 9.4 10.0  --   CREATININE 1.06 1.29* 1.00  GFRNONAA >60 57*  --   GFRAA >60 >60  --     LIVER FUNCTION TESTS: Recent Labs    06/23/18 1448  BILITOT 0.3  AST 17  ALT 22  ALKPHOS 87  PROT 7.3  ALBUMIN 3.3*    TUMOR MARKERS: No results for input(s): AFPTM, CEA, CA199, CHROMGRNA in the last 8760 hours.  Assessment and Plan:  Jeremy Stevenson is a 67 year old male with a history of stage II left colon adenocarcinoma, treated with surgery only, now with liver-only progression, and a single metastasis on PET CT and MRI.    He has been deemed a high-risk candidate for surgery given the location of the met, which is next to the portal branch, in the right liver.    The lesion is ~2.5cm, centrally.    I discussed the logistics and the concepts behind CT guided ablation therapy, in this setting specifically microwave ablation.  Our plan would be for general anesthesia, and he would be treated at [facility],  with a planned overnight observation after the procedure.   Risks for the procedure discussed include: bleeding, infection, abscess, need for further procedure/surgery, including retreatment, local non-target injury, pain, liver dysfunction, cardiopulmonary collapse, death.    I  also discussed our plan for after treatment surveillance, which would entail serial CT scans, first about 3 months after the ablation, with 3 or 4 during the first 12 months.    I answered all of his and his wife's questions.    He would like to proceed with CT guided ablation.   He will also require a tissue biopsy for tissue typing.  I did offer a separate biopsy appointment as an option, though I did qualify that it might delay his treatment slightly.  I told him that I did not think that a pre-ablation biopsy is necessary, as the likelihood of colon cancer mets is almost a certainly, and that waiting for tissue diagnosis in this case was not necessary.  He agrees and would like to proceed.   Plan: - Plan for CT guided tissue ablation with microwave technology at [facility], which can occur after Nov 1st, per his request.  - Plan for a tissue biopsy at the time of the ablation. - I have advised him to observe his doctor's follow up appointments  Thank you for this interesting consult.  I greatly enjoyed meeting Caryl S Stevenson and look forward to participating in their care.  A copy of this report was sent to the requesting provider on this date.  Electronically Signed: Corrie Mckusick 09/07/2018, 4:11 PM   I spent a total of  40 Minutes   in face to face in clinical consultation, greater than 50% of which was counseling/coordinating care for liver metastasis, colon cancer, possible CT guided tissue ablation with microwave, and imaging guided tissue biopsy.

## 2018-09-08 ENCOUNTER — Other Ambulatory Visit (HOSPITAL_COMMUNITY): Payer: Self-pay | Admitting: Interventional Radiology

## 2018-09-09 ENCOUNTER — Other Ambulatory Visit (HOSPITAL_COMMUNITY): Payer: Self-pay | Admitting: Interventional Radiology

## 2018-09-09 DIAGNOSIS — C189 Malignant neoplasm of colon, unspecified: Secondary | ICD-10-CM | POA: Diagnosis not present

## 2018-09-09 DIAGNOSIS — Z299 Encounter for prophylactic measures, unspecified: Secondary | ICD-10-CM | POA: Diagnosis not present

## 2018-09-09 DIAGNOSIS — Z6838 Body mass index (BMI) 38.0-38.9, adult: Secondary | ICD-10-CM | POA: Diagnosis not present

## 2018-09-09 DIAGNOSIS — I10 Essential (primary) hypertension: Secondary | ICD-10-CM | POA: Diagnosis not present

## 2018-09-09 DIAGNOSIS — K769 Liver disease, unspecified: Secondary | ICD-10-CM

## 2018-09-17 NOTE — Patient Instructions (Addendum)
Jeremy Stevenson  09/17/2018   Your procedure is scheduled on: 09-29-18    Report to [facility] Main  Entrance    Report to Admitting at 6:30 AM    Call this number if you have problems the morning of surgery [phone]     Remember: Do not eat food or drink liquids :After Midnight.    BRUSH YOUR TEETH MORNING OF SURGERY AND RINSE YOUR MOUTH OUT, NO CHEWING GUM CANDY OR MINTS.     Take these medicines the morning of surgery with A SIP OF WATER: Amlodipine (Norvasc), Citalopram (Celexa), Metoprolol (Lopressor), and  Lorazepam (Ativan)                                You may not have any metal on your body including hair pins and              piercings  Do not wear jewelry, lotions, powders, cologne or deodorant             Men may shave face and neck.   Do not bring valuables to the hospital. [place].  Contacts, dentures or bridgework may not be worn into surgery.  Leave suitcase in the car. After surgery it may be brought to your room.       Special Instructions: N/A              Please read over the following fact sheets you were given: _____________________________________________________________________             [facility] - Preparing for Surgery Before surgery, you can play an important role.  Because skin is not sterile, your skin needs to be as free of germs as possible.  You can reduce the number of germs on your skin by washing with CHG (chlorahexidine gluconate) soap before surgery.  CHG is an antiseptic cleaner which kills germs and bonds with the skin to continue killing germs even after washing. Please DO NOT use if you have an allergy to CHG or antibacterial soaps.  If your skin becomes reddened/irritated stop using the CHG and inform your nurse when you arrive at Short Stay. Do not shave (including legs and underarms) for at least 48 hours prior to the first CHG shower.  You may shave  your face/neck. Please follow these instructions carefully:  1.  Shower with CHG Soap the night before surgery and the  morning of Surgery.  2.  If you choose to wash your hair, wash your hair first as usual with your  normal  shampoo.  3.  After you shampoo, rinse your hair and body thoroughly to remove the  shampoo.                           4.  Use CHG as you would any other liquid soap.  You can apply chg directly  to the skin and wash                       Gently with a scrungie or clean washcloth.  5.  Apply the CHG Soap to your body ONLY FROM THE NECK DOWN.   Do not use on  face/ open                           Wound or open sores. Avoid contact with eyes, ears mouth and genitals (private parts).                       Wash face,  Genitals (private parts) with your normal soap.             6.  Wash thoroughly, paying special attention to the area where your surgery  will be performed.  7.  Thoroughly rinse your body with warm water from the neck down.  8.  DO NOT shower/wash with your normal soap after using and rinsing off  the CHG Soap.                9.  Pat yourself dry with a clean towel.            10.  Wear clean pajamas.            11.  Place clean sheets on your bed the night of your first shower and do not  sleep with pets. Day of Surgery : Do not apply any lotions/deodorants the morning of surgery.  Please wear clean clothes to the hospital/surgery center.  FAILURE TO FOLLOW THESE INSTRUCTIONS MAY RESULT IN THE CANCELLATION OF YOUR SURGERY PATIENT SIGNATURE_________________________________  NURSE SIGNATURE__________________________________  ________________________________________________________________________

## 2018-09-21 ENCOUNTER — Other Ambulatory Visit: Payer: Self-pay

## 2018-09-21 ENCOUNTER — Encounter (HOSPITAL_COMMUNITY): Payer: Self-pay

## 2018-09-21 ENCOUNTER — Encounter (HOSPITAL_COMMUNITY)
Admission: RE | Admit: 2018-09-21 | Discharge: 2018-09-21 | Disposition: A | Discharge status: Home / Self Care | Payer: Medicare Other | Source: Ambulatory Visit | Attending: Interventional Radiology | Admitting: Interventional Radiology

## 2018-09-21 DIAGNOSIS — I10 Essential (primary) hypertension: Secondary | ICD-10-CM | POA: Diagnosis not present

## 2018-09-21 DIAGNOSIS — C186 Malignant neoplasm of descending colon: Secondary | ICD-10-CM | POA: Diagnosis not present

## 2018-09-21 DIAGNOSIS — Z01818 Encounter for other preprocedural examination: Secondary | ICD-10-CM | POA: Insufficient documentation

## 2018-09-21 HISTORY — DX: Depression, unspecified: F32.A

## 2018-09-21 HISTORY — DX: Major depressive disorder, single episode, unspecified: F32.9

## 2018-09-21 LAB — BASIC METABOLIC PANEL
Anion gap: 10 (ref 5–15)
BUN: 23 mg/dL (ref 8–23)
CO2: 32 mmol/L (ref 22–32)
Calcium: 9.9 mg/dL (ref 8.9–10.3)
Chloride: 94 mmol/L — ABNORMAL LOW (ref 98–111)
Creatinine, Ser: 1.21 mg/dL (ref 0.61–1.24)
GFR calc Af Amer: >60 mL/min (ref >60)
GFR calc non Af Amer: >60 mL/min (ref >60)
Glucose, Bld: 327 mg/dL — ABNORMAL HIGH (ref 70–99)
Potassium: 4.4 mmol/L (ref 3.5–5.1)
Sodium: 136 mmol/L (ref 135–145)

## 2018-09-21 LAB — CBC
HCT: 39.4 % (ref 39.0–52.0)
Hemoglobin: 12.4 g/dL — ABNORMAL LOW (ref 13.0–17.0)
MCH: 28.1 pg (ref 26.0–34.0)
MCHC: 31.5 g/dL (ref 30.0–36.0)
MCV: 89.3 fL (ref 80.0–100.0)
Platelets: 193 10*3/uL (ref 150–400)
RBC: 4.41 MIL/uL (ref 4.22–5.81)
RDW: 13.8 % (ref 11.5–15.5)
WBC: 11.5 10*3/uL — ABNORMAL HIGH (ref 4.0–10.5)
nRBC: 0 % (ref 0.0–0.2)

## 2018-09-21 NOTE — Progress Notes (Signed)
09-21-18 BMP routed to Dr. Earleen Newport, related to elevated BG for review  09-21-18 Bang score routed to PCP for review

## 2018-09-21 NOTE — Progress Notes (Signed)
   09/21/18 1519  OBSTRUCTIVE SLEEP APNEA  Have you ever been diagnosed with sleep apnea through a sleep study? No  Do you snore loudly (loud enough to be heard through closed doors)?  0  Do you often feel tired, fatigued, or sleepy during the daytime (such as falling asleep during driving or talking to someone)? 0  Has anyone observed you stop breathing during your sleep? 0  Do you have, or are you being treated for high blood pressure? 1  BMI more than 35 kg/m2? 1  Age > 50 (1-yes) 1  Neck circumference greater than:Male 16 inches or larger, Male 17inches or larger? 1  Male Gender (Yes=1) 1  Obstructive Sleep Apnea Score 5

## 2018-09-28 ENCOUNTER — Other Ambulatory Visit: Payer: Self-pay | Admitting: Radiology

## 2018-09-28 NOTE — Anesthesia Preprocedure Evaluation (Addendum)
Anesthesia Evaluation  Patient identified by MRN, date of birth, ID band Patient awake    Reviewed: Allergy & Precautions, NPO status , Patient's Chart, lab work & pertinent test results  History of Anesthesia Complications Negative for: history of anesthetic complications  Airway Mallampati: III  TM Distance: <3 FB Neck ROM: Full    Dental  (+) Dental Advisory Given, Teeth Intact   Pulmonary neg pulmonary ROS,    breath sounds clear to auscultation       Cardiovascular hypertension, Pt. on medications and Pt. on home beta blockers  Rhythm:Regular Rate:Normal     Neuro/Psych PSYCHIATRIC DISORDERS Depression negative neurological ROS     GI/Hepatic Neg liver ROS,  Colon cancer    Endo/Other  Morbid obesity  Renal/GU negative Renal ROS     Musculoskeletal negative musculoskeletal ROS (+)   Abdominal   Peds  Hematology negative hematology ROS (+)   Anesthesia Other Findings   Reproductive/Obstetrics                            Anesthesia Physical Anesthesia Plan  ASA: III  Anesthesia Plan: General   Post-op Pain Management:    Induction: Intravenous  PONV Risk Score and Plan: 2 and Treatment may vary due to age or medical condition, Ondansetron and Dexamethasone  Airway Management Planned: Oral ETT and Video Laryngoscope Planned  Additional Equipment: None  Intra-op Plan:   Post-operative Plan: Extubation in OR  Informed Consent: I have reviewed the patients History and Physical, chart, labs and discussed the procedure including the risks, benefits and alternatives for the proposed anesthesia with the patient or authorized representative who has indicated his/her understanding and acceptance.   Dental advisory given  Plan Discussed with: CRNA and Anesthesiologist  Anesthesia Plan Comments:        Anesthesia Quick Evaluation

## 2018-09-29 ENCOUNTER — Ambulatory Visit (HOSPITAL_COMMUNITY): Payer: Medicare Other

## 2018-09-29 ENCOUNTER — Observation Stay (HOSPITAL_COMMUNITY)
Admission: RE | Admit: 2018-09-29 | Discharge: 2018-09-30 | Disposition: A | Discharge status: Home / Self Care | Payer: Medicare Other | Source: Other Acute Inpatient Hospital | Attending: Interventional Radiology | Admitting: Interventional Radiology

## 2018-09-29 ENCOUNTER — Encounter (HOSPITAL_COMMUNITY)
Admission: RE | Disposition: A | Discharge status: Home / Self Care | Payer: Self-pay | Source: Other Acute Inpatient Hospital | Attending: Interventional Radiology

## 2018-09-29 ENCOUNTER — Ambulatory Visit (HOSPITAL_COMMUNITY)
Admission: RE | Admit: 2018-09-29 | Discharge: 2018-09-29 | Disposition: A | Discharge status: Home / Self Care | Payer: Medicare Other | Source: Ambulatory Visit | Attending: Interventional Radiology | Admitting: Interventional Radiology

## 2018-09-29 ENCOUNTER — Ambulatory Visit (HOSPITAL_COMMUNITY): Payer: Medicare Other | Admitting: Anesthesiology

## 2018-09-29 ENCOUNTER — Other Ambulatory Visit: Payer: Self-pay

## 2018-09-29 ENCOUNTER — Encounter (HOSPITAL_COMMUNITY): Payer: Self-pay | Admitting: Anesthesiology

## 2018-09-29 DIAGNOSIS — Z9049 Acquired absence of other specified parts of digestive tract: Secondary | ICD-10-CM | POA: Diagnosis not present

## 2018-09-29 DIAGNOSIS — Z79899 Other long term (current) drug therapy: Secondary | ICD-10-CM | POA: Diagnosis not present

## 2018-09-29 DIAGNOSIS — C787 Secondary malignant neoplasm of liver and intrahepatic bile duct: Secondary | ICD-10-CM | POA: Diagnosis not present

## 2018-09-29 DIAGNOSIS — I10 Essential (primary) hypertension: Secondary | ICD-10-CM | POA: Diagnosis not present

## 2018-09-29 DIAGNOSIS — R160 Hepatomegaly, not elsewhere classified: Secondary | ICD-10-CM | POA: Diagnosis present

## 2018-09-29 DIAGNOSIS — K7689 Other specified diseases of liver: Secondary | ICD-10-CM | POA: Diagnosis not present

## 2018-09-29 DIAGNOSIS — Z01818 Encounter for other preprocedural examination: Secondary | ICD-10-CM | POA: Diagnosis not present

## 2018-09-29 DIAGNOSIS — C189 Malignant neoplasm of colon, unspecified: Secondary | ICD-10-CM | POA: Diagnosis not present

## 2018-09-29 DIAGNOSIS — C186 Malignant neoplasm of descending colon: Principal | ICD-10-CM | POA: Insufficient documentation

## 2018-09-29 DIAGNOSIS — Z885 Allergy status to narcotic agent status: Secondary | ICD-10-CM | POA: Diagnosis not present

## 2018-09-29 DIAGNOSIS — F329 Major depressive disorder, single episode, unspecified: Secondary | ICD-10-CM | POA: Insufficient documentation

## 2018-09-29 DIAGNOSIS — Z6838 Body mass index (BMI) 38.0-38.9, adult: Secondary | ICD-10-CM | POA: Insufficient documentation

## 2018-09-29 DIAGNOSIS — C229 Malignant neoplasm of liver, not specified as primary or secondary: Secondary | ICD-10-CM | POA: Diagnosis not present

## 2018-09-29 DIAGNOSIS — K769 Liver disease, unspecified: Secondary | ICD-10-CM

## 2018-09-29 HISTORY — PX: RADIOLOGY WITH ANESTHESIA: SHX6223

## 2018-09-29 LAB — COMPREHENSIVE METABOLIC PANEL
ALT: 21 U/L (ref 0–44)
AST: 26 U/L (ref 15–41)
Albumin: 3.6 g/dL (ref 3.5–5.0)
Alkaline Phosphatase: 79 U/L (ref 38–126)
Anion gap: 11 (ref 5–15)
BUN: 18 mg/dL (ref 8–23)
CO2: 28 mmol/L (ref 22–32)
Calcium: 9.4 mg/dL (ref 8.9–10.3)
Chloride: 97 mmol/L — ABNORMAL LOW (ref 98–111)
Creatinine, Ser: 1.01 mg/dL (ref 0.61–1.24)
GFR calc Af Amer: >60 mL/min (ref >60)
GFR calc non Af Amer: >60 mL/min (ref >60)
Glucose, Bld: 211 mg/dL — ABNORMAL HIGH (ref 70–99)
Potassium: 3.2 mmol/L — ABNORMAL LOW (ref 3.5–5.1)
Sodium: 136 mmol/L (ref 135–145)
Total Bilirubin: 0.7 mg/dL (ref 0.3–1.2)
Total Protein: 7.5 g/dL (ref 6.5–8.1)

## 2018-09-29 LAB — CBC WITH DIFFERENTIAL/PLATELET
Abs Immature Granulocytes: 0.14 10*3/uL — ABNORMAL HIGH (ref 0.00–0.07)
Basophils Absolute: 0.1 10*3/uL (ref 0.0–0.1)
Basophils Relative: 1 %
Eosinophils Absolute: 0.5 10*3/uL (ref 0.0–0.5)
Eosinophils Relative: 4 %
HCT: 40.9 % (ref 39.0–52.0)
Hemoglobin: 13.2 g/dL (ref 13.0–17.0)
Immature Granulocytes: 1 %
Lymphocytes Relative: 16 %
Lymphs Abs: 1.9 10*3/uL (ref 0.7–4.0)
MCH: 28.6 pg (ref 26.0–34.0)
MCHC: 32.3 g/dL (ref 30.0–36.0)
MCV: 88.7 fL (ref 80.0–100.0)
Monocytes Absolute: 0.8 10*3/uL (ref 0.1–1.0)
Monocytes Relative: 7 %
Neutro Abs: 8.5 10*3/uL — ABNORMAL HIGH (ref 1.7–7.7)
Neutrophils Relative %: 71 %
Platelets: 202 10*3/uL (ref 150–400)
RBC: 4.61 MIL/uL (ref 4.22–5.81)
RDW: 13.6 % (ref 11.5–15.5)
WBC: 11.9 10*3/uL — ABNORMAL HIGH (ref 4.0–10.5)
nRBC: 0 % (ref 0.0–0.2)

## 2018-09-29 LAB — TYPE AND SCREEN
ABO/RH(D): AB POS
Antibody Screen: NEGATIVE

## 2018-09-29 LAB — PROTIME-INR
INR: 0.97
Prothrombin Time: 12.8 seconds (ref 11.4–15.2)

## 2018-09-29 SURGERY — CT WITH ANESTHESIA
Anesthesia: General

## 2018-09-29 MED ORDER — ROCURONIUM BROMIDE 10 MG/ML (PF) SYRINGE
PREFILLED_SYRINGE | INTRAVENOUS | Status: DC | PRN
  Administered 2018-09-29 (×4): 10 mg via INTRAVENOUS
  Administered 2018-09-29: 50 mg via INTRAVENOUS
  Administered 2018-09-29: 20 mg via INTRAVENOUS

## 2018-09-29 MED ORDER — DEXAMETHASONE SODIUM PHOSPHATE 10 MG/ML IJ SOLN
INTRAMUSCULAR | Status: DC | PRN
  Administered 2018-09-29: 8 mg via INTRAVENOUS

## 2018-09-29 MED ORDER — HYDROCODONE-ACETAMINOPHEN 5-325 MG PO TABS
1.0000 | ORAL_TABLET | ORAL | Status: DC | PRN
  Administered 2018-09-29 (×2): 1 via ORAL
  Filled 2018-09-29 (×2): qty 1

## 2018-09-29 MED ORDER — ONDANSETRON HCL 4 MG/2ML IJ SOLN: 4.0000 mg | Freq: Four times a day (QID) | INTRAMUSCULAR | Status: DC | PRN

## 2018-09-29 MED ORDER — FENTANYL CITRATE (PF) 100 MCG/2ML IJ SOLN
INTRAMUSCULAR | Status: AC
  Filled 2018-09-29: qty 2

## 2018-09-29 MED ORDER — ONDANSETRON HCL 4 MG/2ML IJ SOLN: 4.0000 mg | Freq: Once | INTRAMUSCULAR | Status: DC | PRN

## 2018-09-29 MED ORDER — EPHEDRINE SULFATE-NACL 50-0.9 MG/10ML-% IV SOSY
PREFILLED_SYRINGE | INTRAVENOUS | Status: DC | PRN
  Administered 2018-09-29: 5 mg via INTRAVENOUS
  Administered 2018-09-29 (×2): 10 mg via INTRAVENOUS
  Administered 2018-09-29: 5 mg via INTRAVENOUS

## 2018-09-29 MED ORDER — LACTATED RINGERS IV SOLN
INTRAVENOUS | Status: AC
  Administered 2018-09-29: 14:00:00 via INTRAVENOUS

## 2018-09-29 MED ORDER — PROPOFOL 10 MG/ML IV BOLUS
INTRAVENOUS | Status: DC | PRN
  Administered 2018-09-29: 170 mg via INTRAVENOUS

## 2018-09-29 MED ORDER — SODIUM CHLORIDE 0.9 % IV SOLN
INTRAVENOUS | Status: DC | PRN
  Administered 2018-09-29: 20 ug/min via INTRAVENOUS

## 2018-09-29 MED ORDER — MIDAZOLAM HCL 5 MG/5ML IJ SOLN
INTRAMUSCULAR | Status: DC | PRN
  Administered 2018-09-29: 2 mg via INTRAVENOUS

## 2018-09-29 MED ORDER — ONDANSETRON HCL 4 MG/2ML IJ SOLN
INTRAMUSCULAR | Status: DC | PRN
  Administered 2018-09-29: 4 mg via INTRAVENOUS

## 2018-09-29 MED ORDER — LOSARTAN POTASSIUM 50 MG PO TABS
100.0000 mg | ORAL_TABLET | Freq: Every day | ORAL | Status: DC
  Administered 2018-09-30: 100 mg via ORAL
  Filled 2018-09-29 (×2): qty 2

## 2018-09-29 MED ORDER — IOPAMIDOL (ISOVUE-370) INJECTION 76%
100.0000 mL | Freq: Once | INTRAVENOUS | Status: AC | PRN
  Administered 2018-09-29: 100 mL via INTRAVENOUS

## 2018-09-29 MED ORDER — FENTANYL CITRATE (PF) 250 MCG/5ML IJ SOLN
INTRAMUSCULAR | Status: AC
  Filled 2018-09-29: qty 5

## 2018-09-29 MED ORDER — SUGAMMADEX SODIUM 500 MG/5ML IV SOLN
INTRAVENOUS | Status: DC | PRN
  Administered 2018-09-29: 350 mg via INTRAVENOUS

## 2018-09-29 MED ORDER — HYDRALAZINE HCL 20 MG/ML IJ SOLN: 10.0000 mg | INTRAMUSCULAR | Status: DC | PRN

## 2018-09-29 MED ORDER — OXYCODONE HCL 5 MG PO TABS: 5.0000 mg | ORAL_TABLET | Freq: Once | ORAL | Status: DC | PRN

## 2018-09-29 MED ORDER — CHLORTHALIDONE 25 MG PO TABS
25.0000 mg | ORAL_TABLET | Freq: Every day | ORAL | Status: DC
  Administered 2018-09-30: 25 mg via ORAL
  Filled 2018-09-29: qty 1

## 2018-09-29 MED ORDER — SENNOSIDES-DOCUSATE SODIUM 8.6-50 MG PO TABS: 1.0000 | ORAL_TABLET | Freq: Every day | ORAL | Status: DC | PRN

## 2018-09-29 MED ORDER — PIPERACILLIN-TAZOBACTAM 3.375 G IVPB
3.3750 g | Freq: Once | INTRAVENOUS | Status: AC
  Administered 2018-09-29: 3.375 g via INTRAVENOUS
  Filled 2018-09-29: qty 50

## 2018-09-29 MED ORDER — IOPAMIDOL (ISOVUE-370) INJECTION 76%
80.0000 mL | Freq: Once | INTRAVENOUS | Status: AC | PRN
  Administered 2018-09-29: 80 mL via INTRAVENOUS

## 2018-09-29 MED ORDER — LACTATED RINGERS IV SOLN
INTRAVENOUS | Status: DC
  Administered 2018-09-29 (×2): via INTRAVENOUS

## 2018-09-29 MED ORDER — SODIUM CHLORIDE (PF) 0.9 % IJ SOLN
INTRAMUSCULAR | Status: AC
  Filled 2018-09-29: qty 200

## 2018-09-29 MED ORDER — CITALOPRAM HYDROBROMIDE 20 MG PO TABS
20.0000 mg | ORAL_TABLET | Freq: Every day | ORAL | Status: DC
  Administered 2018-09-30: 20 mg via ORAL
  Filled 2018-09-29 (×2): qty 1

## 2018-09-29 MED ORDER — OXYCODONE HCL 5 MG/5ML PO SOLN
5.0000 mg | Freq: Once | ORAL | Status: DC | PRN
  Filled 2018-09-29: qty 5

## 2018-09-29 MED ORDER — METOPROLOL TARTRATE 50 MG PO TABS
100.0000 mg | ORAL_TABLET | Freq: Two times a day (BID) | ORAL | Status: DC
  Administered 2018-09-29 – 2018-09-30 (×2): 100 mg via ORAL
  Filled 2018-09-29 (×3): qty 2

## 2018-09-29 MED ORDER — FENTANYL CITRATE (PF) 100 MCG/2ML IJ SOLN: 25.0000 ug | INTRAMUSCULAR | Status: DC | PRN

## 2018-09-29 MED ORDER — DOCUSATE SODIUM 100 MG PO CAPS
100.0000 mg | ORAL_CAPSULE | Freq: Two times a day (BID) | ORAL | Status: DC
  Administered 2018-09-29 – 2018-09-30 (×2): 100 mg via ORAL
  Filled 2018-09-29 (×3): qty 1

## 2018-09-29 MED ORDER — IOPAMIDOL (ISOVUE-370) INJECTION 76%
INTRAVENOUS | Status: AC
  Filled 2018-09-29: qty 100

## 2018-09-29 MED ORDER — MIDAZOLAM HCL 2 MG/2ML IJ SOLN
INTRAMUSCULAR | Status: AC
  Filled 2018-09-29: qty 2

## 2018-09-29 MED ORDER — LIDOCAINE 2% (20 MG/ML) 5 ML SYRINGE
INTRAMUSCULAR | Status: DC | PRN
  Administered 2018-09-29: 80 mg via INTRAVENOUS

## 2018-09-29 MED ORDER — FENTANYL CITRATE (PF) 100 MCG/2ML IJ SOLN
INTRAMUSCULAR | Status: DC | PRN
  Administered 2018-09-29 (×2): 50 ug via INTRAVENOUS
  Administered 2018-09-29: 100 ug via INTRAVENOUS
  Administered 2018-09-29 (×3): 50 ug via INTRAVENOUS

## 2018-09-29 MED ORDER — AMLODIPINE BESYLATE 5 MG PO TABS
5.0000 mg | ORAL_TABLET | Freq: Every day | ORAL | Status: DC
  Administered 2018-09-30: 5 mg via ORAL
  Filled 2018-09-29 (×2): qty 1

## 2018-09-29 MED ORDER — SUCCINYLCHOLINE CHLORIDE 200 MG/10ML IV SOSY
PREFILLED_SYRINGE | INTRAVENOUS | Status: DC | PRN
  Administered 2018-09-29: 140 mg via INTRAVENOUS

## 2018-09-29 NOTE — Procedures (Signed)
Interventional Radiology Procedure Note  Procedure: CT/US guided biopsy and tissue ablation of oligometastatic liver disease, for history of colon adenocarcinoma and single right liver mass.  Biopsy performed Microwave ablation performed 2 treatments with 1 reposition 12min @ 95W, then 59min 65W 73min @ 95W, then 44PZX 80W  Complications: None  Recommendations:  - To PACU - admission overnight - routine wound care - advance diet as tolerated - follow up pathology - anticipate DC tomorrow - Follow up with Dr. Earleen Newport in [place] clinic in ~4 weeks, no imaging planned   Signed,  Dulcy Fanny. Earleen Newport, DO

## 2018-09-29 NOTE — Progress Notes (Signed)
Patient ID: Jeremy Stevenson, male   DOB: Dec 10, 1950, 67 y.o.   MRN: [id number] Patient doing okay; has some mid upper abdominal "gas" pains; denies N/V, resp difficulties BP 161/94; afebrile Puncture sites RUQ abd clean and dry, no bleeding, NT; abd soft,+BS, not sig tender in other regions; yellow urine in foley  A/P: hx colon ca with rt liver lesion; s/p liver bx/MWA earlier today; for overnight observation; resume home medications; advance diet as tolerated; d/c foley later today; follow-up with Dr. Earleen Newport in [facility] in 1 month  Rowe Robert, [facility]

## 2018-09-29 NOTE — Anesthesia Postprocedure Evaluation (Signed)
Anesthesia Post Note  Patient: Deegan S Martinique  Procedure(s) Performed: CT WITH ANESTHESIA/MICROWAVE THERMAL ABLATION OF LIVER, BIOPSY (N/A )     Patient location during evaluation: PACU Anesthesia Type: General Level of consciousness: awake and alert Pain management: pain level controlled Vital Signs Assessment: post-procedure vital signs reviewed and stable Respiratory status: spontaneous breathing, nonlabored ventilation and respiratory function stable Cardiovascular status: blood pressure returned to baseline and stable Postop Assessment: no apparent nausea or vomiting Anesthetic complications: no    Last Vitals:  Vitals:   09/29/18 1426 09/29/18 1456  BP: (!) 169/101 (!) 161/94  Pulse: 74 76  Resp:  17  Temp:  36.4 C  SpO2:  96%    Last Pain:  Vitals:   09/29/18 1515  TempSrc:   PainSc: Dibble Aleana Fifita

## 2018-09-29 NOTE — Transfer of Care (Signed)
Immediate Anesthesia Transfer of Care Note  Patient: Jeremy Stevenson  Procedure(s) Performed: Procedure(s): CT WITH ANESTHESIA/MICROWAVE THERMAL ABLATION OF LIVER, BIOPSY (N/A)  Patient Location: PACU  Anesthesia Type:General  Level of Consciousness:  sedated, patient cooperative and responds to stimulation  Airway & Oxygen Therapy:Patient Spontanous Breathing and Patient connected to face mask oxgen  Post-op Assessment:  Report given to PACU RN and Post -op Vital signs reviewed and stable  Post vital signs:  Reviewed and stable  Last Vitals:  Vitals:   09/29/18 0655 09/29/18 1215  BP: (!) 159/104 (!) 185/106  Pulse: 72 72  Resp: 16 16  Temp: 36.7 C 36.8 C  SpO2: 364% 38%    Complications: No apparent anesthesia complications

## 2018-09-29 NOTE — Addendum Note (Signed)
Addendum  created 09/29/18 1625 by Lavina Hamman, CRNA   Charge Capture section accepted

## 2018-09-29 NOTE — Anesthesia Procedure Notes (Signed)
Procedure Name: Intubation Date/Time: 09/29/2018 8:38 AM Performed by: Lavina Hamman, CRNA Pre-anesthesia Checklist: Patient identified, Emergency Drugs available, Suction available, Patient being monitored and Timeout performed Patient Re-evaluated:Patient Re-evaluated prior to induction Oxygen Delivery Method: Circle system utilized Preoxygenation: Pre-oxygenation with 100% oxygen Induction Type: IV induction Ventilation: Mask ventilation without difficulty and Oral airway inserted - appropriate to patient size Laryngoscope Size: Mac and 4 Grade View: Grade III Tube type: Oral Tube size: 7.5 mm Number of attempts: 1 Airway Equipment and Method: Stylet Placement Confirmation: ETT inserted through vocal cords under direct vision,  positive ETCO2,  CO2 detector and breath sounds checked- equal and bilateral Secured at: 25 cm Tube secured with: Tape Dental Injury: Teeth and Oropharynx as per pre-operative assessment  Difficulty Due To: Difficulty was anticipated, Difficult Airway- due to anterior larynx and Difficult Airway- due to limited oral opening Comments: Easy mask with OAW, G3 with Mac 4 no attempt to pass ett, G1 with Glide3, easy pass of ett, VSS, ATOI.

## 2018-09-29 NOTE — H&P (Signed)
  Referring Physician(s): Sherrill,B  Supervising Physician: Wagner, Jaime  Patient Status: [facility] TBA  Chief Complaint: Colon cancer  Subjective: Patient familiar to IR service from consultation with Dr. Wagner on 09/07/2018 to discuss treatment options for right hepatic lobe lesion.  He has a history of stage II left colon adenocarcinoma, status post left colectomy on 01/20/2017.  Recent imaging has revealed a 2.6 cm  mass in the central right hepatic lobe consistent with metastasis.  He has been deemed a high risk candidate for surgery given the location of the lesion which is next to the portal branch.  He was deemed an appropriate candidate for CT-guided microwave ablation and biopsy of the lesion and presents today for the procedures.  He currently denies fever, headache, chest pain, dyspnea, cough, abdominal/back pain, nausea, vomiting or bleeding.  Additional medical history as below.  Past Medical History:  Diagnosis Date  . Depression   . Genetic testing 03/16/2017   Mr. Goffe underwent genetic counseling and testing for hereditary cancer syndromes on 03/03/2017. His results were negative for mutations in all 46 genes analyzed by [employer]'s Common Hereditary Cancers Panel. Genes analyzed include: APC, ATM, AXIN2, BARD1, BMPR1A, BRCA1, BRCA2, BRIP1, CDH1, CDKN2A, CHEK2, CTNNA1, DICER1, EPCAM, GREM1, HOXB13, KIT, MEN1, MLH1, MSH2, MSH3, MSH6, MUTYH, NBN, NF1, NT  . Hypertension   . Neoplasm of uncertain behavior of descending colon 01/20/2017   Past Surgical History:  Procedure Laterality Date  . COLONOSCOPY N/A 01/02/2017   Procedure: COLONOSCOPY;  Surgeon: Najeeb U Rehman, MD;  [facility];  Service: Endoscopy;  Laterality: N/A;  1000  . COLONOSCOPY N/A 08/07/2017   Procedure: COLONOSCOPY;  Surgeon: Rehman, Najeeb U, MD;  [facility];  Service: Endoscopy;  Laterality: N/A;  1045 - per Ann LM of new time  . HERNIA REPAIR     67 years old  . IR RADIOLOGIST EVAL  & MGMT  09/07/2018  . LAPAROSCOPIC SIGMOID COLECTOMY N/A 01/20/2017   Procedure: LAPAROSCOPIC LEFT COLECTOMY, TAKEDOWN SPLENIC FLEXURE, AND BIOPSY OF PERITONEAL NODULE;  Surgeon: Haywood Ingram, MD;  Location: [facility];  Service: General;  Laterality: N/A;  . TONSILLECTOMY     67 years old       Allergies: Codeine  Medications: Prior to Admission medications   Medication Sig Start Date End Date Taking? Authorizing Provider  amLODipine (NORVASC) 5 MG tablet Take 5 mg by mouth daily.  10/05/16  Yes [provider]  chlorthalidone (HYGROTON) 25 MG tablet Take 25 mg by mouth daily.    Yes [provider]  citalopram (CELEXA) 20 MG tablet Take 20 mg by mouth daily.  12/06/16  Yes [provider]  LORazepam (ATIVAN) 0.5 MG tablet Take 0.5 mg by mouth 2 (two) times daily as needed for anxiety. 09/09/18  Yes [provider]  losartan (COZAAR) 100 MG tablet Take 100 mg by mouth daily.  11/29/16  Yes [provider]  metoprolol (LOPRESSOR) 100 MG tablet Take 100 mg by mouth 2 (two) times daily.  11/28/16  Yes [provider]     Vital Signs: BP (!) 159/104   Pulse 72   Temp 98 F (36.7 C) (Oral)   Resp 16   Ht 6' 2" (1.88 m)   Wt (!) 303 lb 6 oz (137.6 kg)   SpO2 100%   BMI 38.95 kg/m   Physical Exam awake, alert.  Chest clear to auscultation bilaterally.  Heart with regular rate and rhythm.  Abdomen obese, soft,   positive bowel sounds, nontender.  Trace pretibial edema bilaterally.  Imaging: No results found.  Labs:  CBC: Recent Labs    06/23/18 1448 09/21/18 1543 09/29/18 0700  WBC 11.9* 11.5* 11.9*  HGB 12.7* 12.4* 13.2  HCT 38.5 39.4 40.9  PLT 188 193 202    COAGS: Recent Labs    09/29/18 0700  INR 0.97    BMP: Recent Labs    06/23/18 1448 07/12/18 1515 08/25/18 1223 09/21/18 1543 09/29/18 0700  NA 137 137  --  136 136  K 3.4* 3.4*  --  4.4 3.2*  CL 95* 96*  --  94* 97*  CO2 30 31  --  32 28  GLUCOSE  256* 214*  --  327* 211*  BUN 21 22  --  23 18  CALCIUM 9.4 10.0  --  9.9 9.4  CREATININE 1.06 1.29* 1.00 1.21 1.01  GFRNONAA >60 57*  --  >60 >60  GFRAA >60 >60  --  >60 >60    LIVER FUNCTION TESTS: Recent Labs    06/23/18 1448 09/29/18 0700  BILITOT 0.3 0.7  AST 17 26  ALT 22 21  ALKPHOS 87 79  PROT 7.3 7.5  ALBUMIN 3.3* 3.6    Assessment and Plan: Pt with history of stage II left colon adenocarcinoma, status post left colectomy on 01/20/2017.  Recent imaging has revealed a 2.6 cm  mass in the central right hepatic lobe consistent with metastasis.  He has been deemed a high risk candidate for surgery given the location of the lesion which is next to the portal branch.  Following recent consultation with Dr. Earleen Newport he was deemed an appropriate candidate for CT-guided microwave ablation and biopsy of the lesion and presents today for the procedures.  Details/risks of procedures, including but not limited to, internal bleeding, infection, injury to adjacent structures, anesthesia related complications discussed with patient and family with their understanding and consent.  This procedure involves the use of X-rays and because of the nature of the planned procedure, it is possible that we will have prolonged use of X-ray fluoroscopy.  Potential radiation risks to you include (but are not limited to) the following: - A slightly elevated risk for cancer  several years later in life. This risk is typically less than 0.5% percent. This risk is low in comparison to the normal incidence of human cancer, which is 33% for women and 50% for men according to the [place]. - Radiation induced injury can include skin redness, resembling a rash, tissue breakdown / ulcers and hair loss (which can be temporary or permanent).   The likelihood of either of these occurring depends on the difficulty of the procedure and whether you are sensitive to radiation due to previous procedures,  disease, or genetic conditions.   IF your procedure requires a prolonged use of radiation, you will be notified and given written instructions for further action.  It is your responsibility to monitor the irradiated area for the 2 weeks following the procedure and to notify your physician if you are concerned that you have suffered a radiation induced injury.    Post procedure the patient will be admitted for overnight observation.  Electronically Signed: D. Rowe Robert, PA-C 09/29/2018, 8:10 AM   I spent a total of 30 minutes at the the patient's bedside AND on the patient's hospital floor or unit, greater than 50% of which was counseling/coordinating care for CT-guided microwave ablation and biopsy of right liver lesion

## 2018-09-30 ENCOUNTER — Encounter (HOSPITAL_COMMUNITY): Payer: Self-pay | Admitting: Interventional Radiology

## 2018-09-30 DIAGNOSIS — F329 Major depressive disorder, single episode, unspecified: Secondary | ICD-10-CM | POA: Diagnosis not present

## 2018-09-30 DIAGNOSIS — I10 Essential (primary) hypertension: Secondary | ICD-10-CM | POA: Diagnosis not present

## 2018-09-30 DIAGNOSIS — C787 Secondary malignant neoplasm of liver and intrahepatic bile duct: Secondary | ICD-10-CM | POA: Diagnosis not present

## 2018-09-30 DIAGNOSIS — C186 Malignant neoplasm of descending colon: Secondary | ICD-10-CM | POA: Diagnosis not present

## 2018-09-30 DIAGNOSIS — Z79899 Other long term (current) drug therapy: Secondary | ICD-10-CM | POA: Diagnosis not present

## 2018-09-30 DIAGNOSIS — Z885 Allergy status to narcotic agent status: Secondary | ICD-10-CM | POA: Diagnosis not present

## 2018-09-30 NOTE — Discharge Instructions (Signed)
Radiofrequency/Microwave Ablation of Liver Tumors, Care After °Refer to this sheet in the next few weeks. These instructions provide you with information on caring for yourself after your procedure. Your health care provider may also give you more specific instructions. Your treatment has been planned according to current medical practices, but problems sometimes occur. Call your health care provider if you have any problems or questions after your procedure. °What can I expect after the procedure? °After your procedure, you may have pain and discomfort in the upper abdomen. You will be given pain medicines to control this. °Follow these instructions at home: °· Take all medicines as directed by your health care provider. °· Follow diet instructions as directed by your health care provider. °· Follow instructions regarding rest and physical activity. °Contact a health care provider if: °· You cannot pass gas. °· You cannot have a bowel movement within 2 days. °· You have a skin rash. °· You have a fever. °Get help right away if: °· You have severe or lasting abdominal pain or pain in your shoulder or back. °· You have trouble swallowing or breathing. °· You have severe weakness or dizziness. °· You have chest pain or shortness of breath. °This information is not intended to replace advice given to you by your health care provider. Make sure you discuss any questions you have with your health care provider. °Document Released: 08/24/2013 Document Revised: 04/10/2016 Document Reviewed: 07/11/2013 °Elsevier Interactive Patient Education © 2018 [employer] ° °

## 2018-09-30 NOTE — Progress Notes (Signed)
Pt. Discharged to home with wife. I reviewed discharge instructions and no immediate questions or concerns were mentioned. Pt opted to walk down stairs with wife as he was discharged.  Reinaldo Berber, RN

## 2018-09-30 NOTE — Discharge Summary (Signed)
Patient ID: Jeremy Stevenson MRN: [id number] DOB/AGE: 65-27-1952 65 y.o.  Admit date: 09/29/2018 Discharge date: 09/30/2018  Supervising Physician: Corrie Mckusick  Patient Status: [facility] - In-pt  Admission Diagnoses: Colon cancer with liver lesion  Discharge Diagnoses: Colon cancer with liver lesion, status post CT-guided biopsy and microwave ablation on 09/29/2018 Active Problems:   Liver mass, right lobe  Past Medical History:  Diagnosis Date  . Depression   . Genetic testing 03/16/2017   Jeremy Stevenson underwent genetic counseling and testing for hereditary cancer syndromes on 03/03/2017. His results were negative for mutations in all 46 genes analyzed by [employer]'s Common Hereditary Cancers Panel. Genes analyzed include: APC, ATM, AXIN2, BARD1, BMPR1A, BRCA1, BRCA2, BRIP1, CDH1, CDKN2A, CHEK2, CTNNA1, DICER1, EPCAM, GREM1, HOXB13, KIT, MEN1, MLH1, MSH2, MSH3, MSH6, MUTYH, NBN, NF1, NT  . Hypertension   . Neoplasm of uncertain behavior of descending colon 01/20/2017   Past Surgical History:  Procedure Laterality Date  . COLONOSCOPY N/A 01/02/2017   Procedure: COLONOSCOPY;  Surgeon: Rogene Houston, MD;  [facility];  Service: Endoscopy;  Laterality: N/A;  1000  . COLONOSCOPY N/A 08/07/2017   Procedure: COLONOSCOPY;  Surgeon: Rogene Houston, MD;  [facility];  Service: Endoscopy;  Laterality: N/A;  1045 - per Lelon Frohlich LM of new time  . HERNIA REPAIR     67 years old  . IR RADIOLOGIST EVAL & MGMT  09/07/2018  . LAPAROSCOPIC SIGMOID COLECTOMY N/A 01/20/2017   Procedure: LAPAROSCOPIC LEFT COLECTOMY, TAKEDOWN SPLENIC FLEXURE, AND BIOPSY OF PERITONEAL NODULE;  Surgeon: Fanny Skates, MD;  Location: [facility];  Service: General;  Laterality: N/A;  . RADIOLOGY WITH ANESTHESIA N/A 09/29/2018   Procedure: CT WITH ANESTHESIA/MICROWAVE THERMAL ABLATION OF LIVER, BIOPSY;  Surgeon: Corrie Mckusick, DO;  Location: [facility];  Service: Anesthesiology;  Laterality: N/A;  . TONSILLECTOMY     67  years old     Discharge condition: Good  Hospital Course: Jeremy Stevenson is a 67 year old male with history of stage II left colon adenocarcinoma, status post left colectomy on 01/20/2017.  Recent imaging has revealed a 2.6 cm mass in the central right hepatic lobe consistent with metastasis.  He was deemed a high risk candidate for surgery given the location of the lesion which is next to the portal branch. Following recent consultation with Dr. Earleen Newport he was deemed an appropriate candidate for CT-guided microwave ablation and biopsy of the lesion and underwent the above procedure at [facility] on 09/29/2018 under general anesthesia.  The procedure was performed without immediate complications and he was admitted for overnight observation.  Overnight the patient did well.  He denied fever, chest pain, significant abdominal pain, nausea, vomiting or bleeding.  He did have some mild "gas pains" which resolved prior to discharge.  On the day of discharge he was stable.  He was able to ambulate, void and tolerate his diet without significant difficulty.  He will be scheduled for follow-up in the IR clinic with Dr. Earleen Newport in 4 weeks.  He will resume his usual home medications and continue care with Dr.Sherrill as scheduled.  He was told to contact IR service with any additional questions or concerns.  Consults:  anesthesia service  Significant Diagnostic Studies:  Results for orders placed or performed during the hospital encounter of 09/29/18  CBC with Differential/Platelet  Result Value Ref Range   WBC 11.9 (H) 4.0 - 10.5 K/uL   RBC 4.61 4.22 - 5.81 MIL/uL   Hemoglobin 13.2 13.0 -  17.0 g/dL   HCT 40.9 39.0 - 52.0 %   MCV 88.7 80.0 - 100.0 fL   MCH 28.6 26.0 - 34.0 pg   MCHC 32.3 30.0 - 36.0 g/dL   RDW 13.6 11.5 - 15.5 %   Platelets 202 150 - 400 K/uL   nRBC 0.0 0.0 - 0.2 %   Neutrophils Relative % 71 %   Neutro Abs 8.5 (H) 1.7 - 7.7 K/uL   Lymphocytes Relative 16 %   Lymphs Abs 1.9 0.7 - 4.0 K/uL    Monocytes Relative 7 %   Monocytes Absolute 0.8 0.1 - 1.0 K/uL   Eosinophils Relative 4 %   Eosinophils Absolute 0.5 0.0 - 0.5 K/uL   Basophils Relative 1 %   Basophils Absolute 0.1 0.0 - 0.1 K/uL   Immature Granulocytes 1 %   Abs Immature Granulocytes 0.14 (H) 0.00 - 0.07 K/uL  Comprehensive metabolic panel  Result Value Ref Range   Sodium 136 135 - 145 mmol/L   Potassium 3.2 (L) 3.5 - 5.1 mmol/L   Chloride 97 (L) 98 - 111 mmol/L   CO2 28 22 - 32 mmol/L   Glucose, Bld 211 (H) 70 - 99 mg/dL   BUN 18 8 - 23 mg/dL   Creatinine, Ser 1.01 0.61 - 1.24 mg/dL   Calcium 9.4 8.9 - 10.3 mg/dL   Total Protein 7.5 6.5 - 8.1 g/dL   Albumin 3.6 3.5 - 5.0 g/dL   AST 26 15 - 41 U/L   ALT 21 0 - 44 U/L   Alkaline Phosphatase 79 38 - 126 U/L   Total Bilirubin 0.7 0.3 - 1.2 mg/dL   GFR calc non Af Amer >60 >60 mL/min   GFR calc Af Amer >60 >60 mL/min   Anion gap 11 5 - 15  Protime-INR  Result Value Ref Range   Prothrombin Time 12.8 11.4 - 15.2 seconds   INR 0.97   Type and screen  Result Value Ref Range   ABO/RH(D) AB POS    Antibody Screen NEG    Sample Expiration      10/02/2018 Performed at [facility], [place] [address] [place], [place] [address]      Treatments: CT-guided biopsy and microwave ablation of central right hepatic lobe lesion on 09/29/2018 via general anesthesia  Discharge Exam: Blood pressure (!) 144/86, pulse 86, temperature 98.4 F (36.9 C), temperature source Oral, resp. rate 18, height _0  (1.88 m), weight (!) 303 lb 6 oz (137.6 kg), SpO2 96 %. Awake, alert.  Chest clear to auscultation bilaterally.  Heart with regular rate and rhythm.  Abdomen obese, soft, positive bowel sounds, nontender.  Puncture site right upper quadrant clean, dry, nontender.  Trace pretibial edema bilaterally.   Disposition: Discharge disposition: 01-Home or Self Care       Discharge Instructions    Call MD for:  difficulty breathing, headache or visual  disturbances   Complete by:  As directed    Call MD for:  extreme fatigue   Complete by:  As directed    Call MD for:  hives   Complete by:  As directed    Call MD for:  persistant dizziness or light-headedness   Complete by:  As directed    Call MD for:  persistant nausea and vomiting   Complete by:  As directed    Call MD for:  redness, tenderness, or signs of infection (pain, swelling, redness, odor or green/yellow discharge around incision site)   Complete  by:  As directed    Call MD for:  severe uncontrolled pain   Complete by:  As directed    Call MD for:  temperature >100.4   Complete by:  As directed    Diet - low sodium heart healthy   Complete by:  As directed    Discharge wound care:   Complete by:  As directed    Monitor puncture site right upper abdominal region for any signs of redness, drainage or increased pain at site; contact radiology service at [phone] or [phone] with any questions   Driving Restrictions   Complete by:  As directed    No driving for 24 hours   Increase activity slowly   Complete by:  As directed    Lifting restrictions   Complete by:  As directed    No heavy lifting for the next 3 to 4 days   May shower / Bathe   Complete by:  As directed    May walk up steps   Complete by:  As directed      Allergies as of 09/30/2018      Reactions   Codeine Nausea Only      Medication List    TAKE these medications   amLODipine 5 MG tablet Commonly known as:  NORVASC Take 5 mg by mouth daily.   chlorthalidone 25 MG tablet Commonly known as:  HYGROTON Take 25 mg by mouth daily.   citalopram 20 MG tablet Commonly known as:  CELEXA Take 20 mg by mouth daily.   LORazepam 0.5 MG tablet Commonly known as:  ATIVAN Take 0.5 mg by mouth 2 (two) times daily as needed for anxiety.   losartan 100 MG tablet Commonly known as:  COZAAR Take 100 mg by mouth daily.   metoprolol tartrate 100 MG tablet Commonly known as:  LOPRESSOR Take  100 mg by mouth 2 (two) times daily.            Discharge Care Instructions  (From admission, onward)         Start     Ordered   09/30/18 0000  Discharge wound care:    Comments:  Monitor puncture site right upper abdominal region for any signs of redness, drainage or increased pain at site; contact radiology service at [phone] or [phone] with any questions   09/30/18 1137         Follow-up Information    Corrie Mckusick, DO Follow up.   Specialties:  Interventional Radiology, Radiology Why:  Radiology service will call you with follow-up appointment with Dr. Earleen Newport in the [place] clinic in approximately 4 weeks; call [phone] or [phone] with any questions Contact information: [address] [place] [address] [phone]        Ladell Pier, MD Follow up.   Specialty:  Oncology Why:  Follow-up with Dr. Benay Spice as scheduled Contact information: [place] [address] [phone]            Electronically Signed: D. Rowe Robert, PA-C 09/30/2018, 11:39 AM   I have spent less than 30 minutes discharging Jeremy Stevenson.

## 2018-10-05 ENCOUNTER — Telehealth: Payer: Self-pay | Admitting: Oncology

## 2018-10-05 ENCOUNTER — Inpatient Hospital Stay: Payer: Medicare Other | Attending: Oncology | Admitting: Oncology

## 2018-10-05 VITALS — BP 142/88 | HR 70 | Temp 98.5°F | Resp 19 | Ht 74.0 in | Wt 301.7 lb

## 2018-10-05 DIAGNOSIS — Z8041 Family history of malignant neoplasm of ovary: Secondary | ICD-10-CM

## 2018-10-05 DIAGNOSIS — N433 Hydrocele, unspecified: Secondary | ICD-10-CM | POA: Diagnosis not present

## 2018-10-05 DIAGNOSIS — C186 Malignant neoplasm of descending colon: Secondary | ICD-10-CM

## 2018-10-05 DIAGNOSIS — Z803 Family history of malignant neoplasm of breast: Secondary | ICD-10-CM | POA: Diagnosis not present

## 2018-10-05 DIAGNOSIS — I10 Essential (primary) hypertension: Secondary | ICD-10-CM

## 2018-10-05 DIAGNOSIS — C787 Secondary malignant neoplasm of liver and intrahepatic bile duct: Secondary | ICD-10-CM

## 2018-10-05 DIAGNOSIS — L989 Disorder of the skin and subcutaneous tissue, unspecified: Secondary | ICD-10-CM | POA: Diagnosis not present

## 2018-10-05 DIAGNOSIS — Z23 Encounter for immunization: Secondary | ICD-10-CM | POA: Insufficient documentation

## 2018-10-05 DIAGNOSIS — R600 Localized edema: Secondary | ICD-10-CM | POA: Diagnosis not present

## 2018-10-05 DIAGNOSIS — F329 Major depressive disorder, single episode, unspecified: Secondary | ICD-10-CM

## 2018-10-05 MED ORDER — INFLUENZA VAC SPLIT QUAD 0.5 ML IM SUSY
PREFILLED_SYRINGE | INTRAMUSCULAR | Status: AC
  Filled 2018-10-05: qty 0.5

## 2018-10-05 MED ORDER — INFLUENZA VAC SPLIT QUAD 0.5 ML IM SUSY
0.5000 mL | PREFILLED_SYRINGE | Freq: Once | INTRAMUSCULAR | Status: AC
  Administered 2018-10-05: 0.5 mL via INTRAMUSCULAR

## 2018-10-05 NOTE — Progress Notes (Signed)
[facility] OFFICE PROGRESS NOTE   Diagnosis: Colon cancer  INTERVAL HISTORY:   Jeremy Stevenson returns as scheduled.  He underwent a biopsy and ablation of the right liver lesion on 09/29/2018.  The pathology revealed adenocarcinoma consistent with colorectal adenocarcinoma.  Jeremy Stevenson reports tolerating the procedure well.  Objective:  Vital signs in last 24 hours:  Blood pressure (!) 142/88, pulse 70, temperature 98.5 F (36.9 C), temperature source Oral, resp. rate 19, height '6\' 2"'  (1.88 m), weight (!) 301 lb 11.2 oz (136.9 kg), SpO2 100 %.    HEENT: Neck without mass Lymphatics: No cervical or supraclavicular nodes Resp: Lungs clear bilaterally Cardio: Regular rate and rhythm GI: No hepatosplenomegaly, nontender Vascular: The left lower leg is larger than the right side  Lab Results:  Lab Results  Component Value Date   WBC 11.9 (H) 09/29/2018   HGB 13.2 09/29/2018   HCT 40.9 09/29/2018   MCV 88.7 09/29/2018   PLT 202 09/29/2018   NEUTROABS 8.5 (H) 09/29/2018    CMP  Lab Results  Component Value Date   NA 136 09/29/2018   K 3.2 (L) 09/29/2018   CL 97 (L) 09/29/2018   CO2 28 09/29/2018   GLUCOSE 211 (H) 09/29/2018   BUN 18 09/29/2018   CREATININE 1.01 09/29/2018   CALCIUM 9.4 09/29/2018   PROT 7.5 09/29/2018   ALBUMIN 3.6 09/29/2018   AST 26 09/29/2018   ALT 21 09/29/2018   ALKPHOS 79 09/29/2018   BILITOT 0.7 09/29/2018   GFRNONAA >60 09/29/2018   GFRAA >60 09/29/2018    Lab Results  Component Value Date   CEA1 20.90 (H) 07/12/2018    Lab Results  Component Value Date   INR 0.97 09/29/2018    Imaging:  No results found.  Medications: I have reviewed the patient's current medications.   Assessment/Plan:  1. Adenocarcinoma of the descending colon, stage II (T3 N0), status post a left colectomy 01/20/2017 ? MSI-stable, no loss of mismatch repair protein expression ? Elevated preoperative CEA ? Incomplete preoperative  colonoscopy ? Colonoscopy 08/07/2017-multiple polyps removed including tubular adenomas ? CT abdomen/pelvis 02/05/2018-no evidence of metastatic disease. ? Elevated CEA June 2019 ? CTs 06/29/2018-no evidence of metastatic disease, stable mild bilateral iliac adenopathy ? PET scan 08/03/2018- solitary hypermetabolic central right liver metastasis;asymmetric hypermetabolism right palatine tonsil with associated mild asymmetric soft tissue fullness on CT images ? MRI liver 08/25/2018- 2.6 cm mass in the central right liver consistent with metastasis, no other evidence of metastatic disease ? Ablation and biopsy of right liver lesion 09/29/2018-pathology revealed adenocarcinoma  2. Enlargement of the right testicle-hydrocele-Testicular ultrasound 02/26/2017-negative for mass, small bilateral hydroceles  3. Hypertension  4. Depression  5. Family history of breast and ovarian cancer, negative genetic testing  6.Left leg edema, pain.  7.Pigmented lesion right lower leg-refer to dermatology 06/30/2018   Disposition: Jeremy Stevenson appears well.  He underwent biopsy and ablation of the right liver lesion on 09/29/2018.  The pathology confirmed metastatic colon cancer.  We will submit the tissue for Foundation 1 testing.  I discussed treatment options with Jeremy Stevenson.  He understands there is no clear survival benefit from chemotherapy following a metastectomy  procedure.  However many oncologist recommend "adjuvant "chemotherapy in patients who have not received previous chemotherapy.  I recommend FOLFOX chemotherapy.  We reviewed potential toxicities associated with the FOLFOX regimen including the chance for nausea/vomiting, mucositis, diarrhea, alopecia, and hematologic toxicity.  We discussed the sun sensitivity, rash, hyperpigmentation, and hand/foot syndrome  associated with 5-fluorouracil.  We reviewed the allergic reaction and various types of neuropathy seen with oxaliplatin.  He  agrees to proceed.  Jeremy Stevenson requests Dr. Earleen Newport place a Port-A-Cath for administration of chemotherapy. Jeremy Stevenson will attend a chemotherapy teaching class.  We will check baseline labs and a CEA when he returns for the chemotherapy class.  I recommend 6 months of adjuvant therapy.  We will discuss the number of chemotherapy cycles based on an interim restaging evaluation and plans for surgical resection.  45 minutes were spent with the patient today.  The majority of the time was used for counseling and coordination of care.    Betsy Coder, MD  10/05/2018  3:24 PM

## 2018-10-05 NOTE — Patient Instructions (Signed)

## 2018-10-05 NOTE — Progress Notes (Signed)
START ON PATHWAY REGIMEN - Colorectal     A cycle is every 14 days:     Oxaliplatin      Leucovorin      5-Fluorouracil      5-Fluorouracil   **Always confirm dose/schedule in your pharmacy ordering system**  Patient Characteristics: Distant Metastases, First Line, Postoperative Treatment for R0 Resection Therapeutic Status: Distant Metastases BRAF Mutation Status: Awaiting Test Results KRAS/NRAS Mutation Status: Awaiting Test Results Line of Therapy: First Line  Intent of Therapy: Curative Intent, Discussed with Patient

## 2018-10-05 NOTE — Telephone Encounter (Signed)
Pt sched per his request. Ok per GBS. Gave pt avs and calendar

## 2018-10-07 ENCOUNTER — Telehealth: Payer: Self-pay | Admitting: *Deleted

## 2018-10-07 NOTE — Telephone Encounter (Signed)
Requested Foundation 1 testing on liver biopsy case # Accession: [id number] with [facility]ology.

## 2018-10-12 ENCOUNTER — Other Ambulatory Visit: Payer: Self-pay | Admitting: Radiology

## 2018-10-13 ENCOUNTER — Other Ambulatory Visit: Payer: Self-pay | Admitting: Oncology

## 2018-10-13 ENCOUNTER — Ambulatory Visit (HOSPITAL_COMMUNITY)
Admission: RE | Admit: 2018-10-13 | Discharge: 2018-10-13 | Disposition: A | Discharge status: Home / Self Care | Payer: Medicare Other | Source: Ambulatory Visit | Attending: Oncology | Admitting: Oncology

## 2018-10-13 ENCOUNTER — Ambulatory Visit (HOSPITAL_COMMUNITY)
Admission: RE | Admit: 2018-10-13 | Discharge: 2018-10-13 | Disposition: A | Discharge status: Home / Self Care | Payer: Medicare Other | Source: Ambulatory Visit | Attending: Radiology | Admitting: Radiology

## 2018-10-13 ENCOUNTER — Encounter (HOSPITAL_COMMUNITY): Payer: Self-pay

## 2018-10-13 ENCOUNTER — Other Ambulatory Visit: Payer: Self-pay

## 2018-10-13 DIAGNOSIS — I10 Essential (primary) hypertension: Secondary | ICD-10-CM | POA: Diagnosis not present

## 2018-10-13 DIAGNOSIS — C186 Malignant neoplasm of descending colon: Secondary | ICD-10-CM | POA: Diagnosis not present

## 2018-10-13 DIAGNOSIS — Z8041 Family history of malignant neoplasm of ovary: Secondary | ICD-10-CM | POA: Diagnosis not present

## 2018-10-13 DIAGNOSIS — Z79899 Other long term (current) drug therapy: Secondary | ICD-10-CM | POA: Insufficient documentation

## 2018-10-13 DIAGNOSIS — Z5111 Encounter for antineoplastic chemotherapy: Secondary | ICD-10-CM | POA: Diagnosis not present

## 2018-10-13 DIAGNOSIS — Z808 Family history of malignant neoplasm of other organs or systems: Secondary | ICD-10-CM | POA: Diagnosis not present

## 2018-10-13 DIAGNOSIS — Z803 Family history of malignant neoplasm of breast: Secondary | ICD-10-CM | POA: Insufficient documentation

## 2018-10-13 DIAGNOSIS — F329 Major depressive disorder, single episode, unspecified: Secondary | ICD-10-CM | POA: Insufficient documentation

## 2018-10-13 DIAGNOSIS — Z885 Allergy status to narcotic agent status: Secondary | ICD-10-CM | POA: Insufficient documentation

## 2018-10-13 DIAGNOSIS — Z9049 Acquired absence of other specified parts of digestive tract: Secondary | ICD-10-CM | POA: Insufficient documentation

## 2018-10-13 DIAGNOSIS — Z9889 Other specified postprocedural states: Secondary | ICD-10-CM | POA: Diagnosis not present

## 2018-10-13 DIAGNOSIS — C787 Secondary malignant neoplasm of liver and intrahepatic bile duct: Secondary | ICD-10-CM | POA: Diagnosis not present

## 2018-10-13 DIAGNOSIS — C187 Malignant neoplasm of sigmoid colon: Secondary | ICD-10-CM | POA: Diagnosis not present

## 2018-10-13 HISTORY — PX: IR IMAGING GUIDED PORT INSERTION: IMG5740

## 2018-10-13 LAB — CBC WITH DIFFERENTIAL/PLATELET
Abs Immature Granulocytes: 0.16 10*3/uL — ABNORMAL HIGH (ref 0.00–0.07)
Basophils Absolute: 0.1 10*3/uL (ref 0.0–0.1)
Basophils Relative: 1 %
Eosinophils Absolute: 0.5 10*3/uL (ref 0.0–0.5)
Eosinophils Relative: 4 %
HCT: 40.9 % (ref 39.0–52.0)
Hemoglobin: 13.5 g/dL (ref 13.0–17.0)
Immature Granulocytes: 1 %
Lymphocytes Relative: 13 %
Lymphs Abs: 1.6 10*3/uL (ref 0.7–4.0)
MCH: 29.7 pg (ref 26.0–34.0)
MCHC: 33 g/dL (ref 30.0–36.0)
MCV: 89.9 fL (ref 80.0–100.0)
Monocytes Absolute: 0.9 10*3/uL (ref 0.1–1.0)
Monocytes Relative: 7 %
Neutro Abs: 9.7 10*3/uL — ABNORMAL HIGH (ref 1.7–7.7)
Neutrophils Relative %: 74 %
Platelets: 210 10*3/uL (ref 150–400)
RBC: 4.55 MIL/uL (ref 4.22–5.81)
RDW: 13.7 % (ref 11.5–15.5)
WBC: 13 10*3/uL — ABNORMAL HIGH (ref 4.0–10.5)
nRBC: 0 % (ref 0.0–0.2)

## 2018-10-13 LAB — BASIC METABOLIC PANEL
Anion gap: 10 (ref 5–15)
BUN: 20 mg/dL (ref 8–23)
CO2: 28 mmol/L (ref 22–32)
Calcium: 9.3 mg/dL (ref 8.9–10.3)
Chloride: 96 mmol/L — ABNORMAL LOW (ref 98–111)
Creatinine, Ser: 0.88 mg/dL (ref 0.61–1.24)
GFR calc Af Amer: >60 mL/min (ref >60)
GFR calc non Af Amer: >60 mL/min (ref >60)
Glucose, Bld: 188 mg/dL — ABNORMAL HIGH (ref 70–99)
Potassium: 3.4 mmol/L — ABNORMAL LOW (ref 3.5–5.1)
Sodium: 134 mmol/L — ABNORMAL LOW (ref 135–145)

## 2018-10-13 MED ORDER — LIDOCAINE HCL 1 % IJ SOLN
INTRAMUSCULAR | Status: AC | PRN
  Administered 2018-10-13: 15 mL

## 2018-10-13 MED ORDER — SODIUM CHLORIDE 0.9 % IV SOLN
INTRAVENOUS | Status: DC
  Administered 2018-10-13: 11:00:00 via INTRAVENOUS

## 2018-10-13 MED ORDER — HEPARIN SOD (PORK) LOCK FLUSH 100 UNIT/ML IV SOLN
INTRAVENOUS | Status: AC | PRN
  Administered 2018-10-13: 500 [IU] via INTRAVENOUS

## 2018-10-13 MED ORDER — MIDAZOLAM HCL 2 MG/2ML IJ SOLN
INTRAMUSCULAR | Status: AC
  Filled 2018-10-13: qty 4

## 2018-10-13 MED ORDER — CEFAZOLIN SODIUM-DEXTROSE 2-4 GM/100ML-% IV SOLN
2.0000 g | INTRAVENOUS | Status: AC
  Administered 2018-10-13: 2 g via INTRAVENOUS

## 2018-10-13 MED ORDER — HEPARIN SOD (PORK) LOCK FLUSH 100 UNIT/ML IV SOLN
INTRAVENOUS | Status: AC
  Filled 2018-10-13: qty 5

## 2018-10-13 MED ORDER — FENTANYL CITRATE (PF) 100 MCG/2ML IJ SOLN
INTRAMUSCULAR | Status: AC | PRN
  Administered 2018-10-13 (×2): 50 ug via INTRAVENOUS

## 2018-10-13 MED ORDER — MIDAZOLAM HCL 2 MG/2ML IJ SOLN
INTRAMUSCULAR | Status: AC | PRN
  Administered 2018-10-13 (×2): 1 mg via INTRAVENOUS

## 2018-10-13 MED ORDER — FENTANYL CITRATE (PF) 100 MCG/2ML IJ SOLN
INTRAMUSCULAR | Status: AC
  Filled 2018-10-13: qty 2

## 2018-10-13 MED ORDER — LIDOCAINE HCL 1 % IJ SOLN
INTRAMUSCULAR | Status: AC
  Filled 2018-10-13: qty 20

## 2018-10-13 MED ORDER — CEFAZOLIN SODIUM-DEXTROSE 2-4 GM/100ML-% IV SOLN
INTRAVENOUS | Status: AC
  Administered 2018-10-13: 2 g via INTRAVENOUS
  Filled 2018-10-13: qty 100

## 2018-10-13 NOTE — Procedures (Signed)
Interventional Radiology Procedure Note  Procedure: Single Lumen Power Port Placement    Access:  Right IJ vein.  Findings: Catheter tip positioned at SVC/RA junction. Port is ready for immediate use.   Complications: None  EBL: < 10 mL  Recommendations:  - Ok to shower in 24 hours - Do not submerge for 7 days - Routine line care   Nichlos Kunzler T. Mihran Lebarron, M.D Pager:  [phone]   

## 2018-10-13 NOTE — H&P (Signed)
Chief Complaint: Patient was seen in consultation today for port placement at the request of Sherrill,Gary B  Referring Physician(s): Ladell Pier  Supervising Physician: Aletta Edouard  Patient Status: [facility] - Out-pt  History of Present Illness: Jeremy Stevenson is a 67 y.o. male with metastatic colon cancer to the liver. He recently underwent image guided thermal ablation to the liver lesion and is recovering well from that. He is followed by Dr. Benay Spice and they are going to start adjuvant chemotherapy. He is referred for port placement. PMHx, meds, labs, imaging, allergies reviewed. Feels well, no recent fevers, chills, illness. Has been NPO today as directed. Family at bedside.   Past Medical History:  Diagnosis Date  . Depression   . Genetic testing 03/16/2017   [name] underwent genetic counseling and testing for hereditary cancer syndromes on 03/03/2017. His results were negative for mutations in all 46 genes analyzed by [employer]'s Common Hereditary Cancers Panel. Genes analyzed include: APC, ATM, AXIN2, BARD1, BMPR1A, BRCA1, BRCA2, BRIP1, CDH1, CDKN2A, CHEK2, CTNNA1, DICER1, EPCAM, GREM1, HOXB13, KIT, MEN1, MLH1, MSH2, MSH3, MSH6, MUTYH, NBN, NF1, NT  . Hypertension   . 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 COLONOSCOPY N/A 01/02/2017   Procedure: COLONOSCOPY;  Surgeon: Rogene Houston, MD;  Location: [facility]O SUITE;  Service: Endoscopy;  Laterality: N/A;  1000  . COLONOSCOPY N/A 08/07/2017   Procedure: COLONOSCOPY;  Surgeon: Rogene Houston, MD;  Location: [facility] ENDO SUITE;  Service: Endoscopy;  Laterality: N/A;  1045 - per Lelon Frohlich LM of new time  . HERNIA REPAIR     67 years old  . IR RADIOLOGIST EVAL & MGMT  09/07/2018  . LAPAROSCOPIC SIGMOID COLECTOMY N/A 01/20/2017   Procedure: LAPAROSCOPIC LEFT COLECTOMY, TAKEDOWN SPLENIC FLEXURE, AND BIOPSY OF PERITONEAL NODULE;  Surgeon: Fanny Skates,  MD;  Location: [facility] ORS;  Service: General;  Laterality: N/A;  . RADIOLOGY WITH ANESTHESIA N/A 09/29/2018   Procedure: CT WITH ANESTHESIA/MICROWAVE THERMAL ABLATION OF LIVER, BIOPSY;  Surgeon: Corrie Mckusick, DO;  Location: [facility] ORS;  Service: Anesthesiology;  Laterality: N/A;  . TONSILLECTOMY     67 years old    Allergies: Codeine  Medications: Prior to Admission medications   Medication Sig Start Date End Date Taking? Authorizing Provider  amLODipine (NORVASC) 5 MG tablet Take 5 mg by mouth daily.  10/05/16  Yes [provider]  chlorthalidone (HYGROTON) 25 MG tablet Take 25 mg by mouth daily.    Yes [provider]  citalopram (CELEXA) 20 MG tablet Take 20 mg by mouth daily.  12/06/16  Yes [provider]  LORazepam (ATIVAN) 0.5 MG tablet Take 0.5 mg by mouth 2 (two) times daily as needed for anxiety. 09/09/18  Yes [provider]  losartan (COZAAR) 100 MG tablet Take 100 mg by mouth daily.  11/29/16  Yes [provider]  metoprolol (LOPRESSOR) 100 MG tablet Take 100 mg by mouth 2 (two) times daily.  11/28/16  Yes [provider]     Family History  Problem Relation Age of Onset  . Breast cancer Mother 21  . Skin cancer Mother   . Ovarian cancer Sister 57       d.50 due to metastases  . Skin cancer Maternal Uncle   . Skin cancer Cousin 18  . Skin cancer Cousin 38    Social History   Socioeconomic History  . Marital status: Married    Spouse  name: Not on file  . Number of children: Not on file  . Years of education: Not on file  . Highest education level: Not on file  Occupational History  . Not on file  Social Needs  . Financial resource strain: Not on file  . Food insecurity:    Worry: Not on file    Inability: Not on file  . Transportation needs:    Medical: Not on file    Non-medical: Not on file  Tobacco Use  . Smoking status: Never Smoker  . Smokeless tobacco: Never Used  Substance and Sexual Activity  .  Alcohol use: No  . Drug use: No  . Sexual activity: Yes  Lifestyle  . Physical activity:    Days per week: Not on file    Minutes per session: Not on file  . Stress: Not on file  Relationships  . Social connections:    Talks on phone: Not on file    Gets together: Not on file    Attends religious service: Not on file    Active member of club or organization: Not on file    Attends meetings of clubs or organizations: Not on file    Relationship status: Not on file  Other Topics Concern  . Not on file  Social History Narrative  . Not on file     Review of Systems: A 12 point ROS discussed and pertinent positives are indicated in the HPI above.  All other systems are negative.  Review of Systems  Vital Signs: BP (!) 152/105   Pulse 62   Temp 98.2 F (36.8 C) (Oral)   Resp 20   SpO2 96%   Physical Exam  Constitutional: He is oriented to person, place, and time. He appears well-developed and well-nourished. No distress.  HENT:  Head: Normocephalic.  Mouth/Throat: Oropharynx is clear and moist.  Neck: Normal range of motion. No JVD present. No tracheal deviation present.  Cardiovascular: Normal rate, regular rhythm and normal heart sounds.  Pulmonary/Chest: Effort normal and breath sounds normal. No respiratory distress.  Abdominal: Soft. He exhibits no distension and no mass. There is no tenderness.  Neurological: He is alert and oriented to person, place, and time.  Skin: Skin is warm and dry.  Psychiatric: He has a normal mood and affect.    Imaging: Dg Chest 1 View  Result Date: 09/29/2018 CLINICAL DATA:  Preop evaluation for upcoming liver surgery EXAM: CHEST  1 VIEW COMPARISON:  06/29/2018 FINDINGS: Cardiac shadow is at the upper limits of normal in size. Aortic calcifications are again noted. Lungs are well aerated bilaterally. No focal infiltrate or sizable effusion is seen. No acute bony abnormality is noted. IMPRESSION: No acute abnormality noted.  Electronically Signed   By: Inez Catalina M.D.   On: 09/29/2018 09:07   Ct Guide Tissue Ablation  Result Date: 09/29/2018 INDICATION: 67 year old male with a history of colorectal carcinoma and a new right liver lesion representing oligometastatic disease. He presents today for tissue ablation and biopsy EXAM: IMAGE GUIDED BIOPSY LIVER LESION. IMAGE GUIDED TISSUE ABLATION WITH MICROWAVE TECHNOLOGY COMPARISON:  MRI 08/25/2018 MEDICATIONS: NONE ANESTHESIA/SEDATION: General - as administered by the Anesthesia department The patient was continuously monitored during the procedure by the interventional radiology nurse under my direct supervision. FLUOROSCOPY TIME:  CT COMPLICATIONS: NONE TECHNIQUE: Informed written consent was obtained from the patient after a thorough discussion of the procedural risks, benefits and alternatives. All questions were addressed. Maximal Sterile Barrier Technique was utilized including  caps, mask, sterile gowns, sterile gloves, sterile drape, hand hygiene and skin antiseptic. A timeout was performed prior to the initiation of the procedure. Patient is brought to the CT scanner, with the correct patient identified with the correct consent form identified. He was placed on to the CT gantry table and anesthesia team was present for induction of general anesthesia. The patient was then scanned for scout CT. Ultrasound was performed with images stored sent to PACs. Patient was then prepped and draped in the usual sterile fashion. Using ultrasound guidance, fiducial needles were placed into the liver from an anterior approach targeting the lesion of the right liver lobe adjacent to the portal vein bifurcation. Using stepwise ultrasound and CT imaging, a 20 cm biopsy needle and a 20 cm new wave microwave probe were positioned at the lesion. Positioning required 2 separate contrast CT to be performed. The first with 100 cc of contrast and dual phase imaging, and the second with 80 cc of  contrast and dual phase imaging. Once the biopsy needle was positioned on the near site of the lesion, multiple 18 gauge core biopsy were performed. Final repositioning of the new wave probe was performed. The first treatment was performed on the inferior and deep aspect of the lesion. This was performed at 1 minute, 95 watts and then 10 minutes 65 watts for 11 minutes of treatment time. Repeat CT was performed. Needle was then repositioned slightly more superior. Once we confirmed needle position, a second treatment was performed. This was performed at 1 minute 95 watts, and then 10 minutes 65 watts for total of 11 minutes treatment time. All needles and treatment probes were removed. Final CT was performed. Patient tolerated procedure well and remained hemodynamically stable throughout. No complications were encountered and no significant blood loss. FINDINGS: Images during the case demonstrate targeting of lesion at the level of the portal vein bifurcation. Given the difficulty visualizing this lesion, the prior MRI was used for geographic targeting, as well as the landmarks of the portal vein for geographic targeting. IMPRESSION: Status post CT-guided biopsy of liver lesion and ablation of the lesion with microwave technology using 2 separate treatment times. Signed, Dulcy Fanny. Dellia Nims, RPVI [facility] Radiology Electronically Signed   By: Corrie Mckusick D.O.   On: 09/29/2018 13:24   Ct Biopsy  Result Date: 09/29/2018 INDICATION: 67 year old male with a history of colorectal carcinoma and a new right liver lesion representing oligometastatic disease. He presents today for tissue ablation and biopsy EXAM: IMAGE GUIDED BIOPSY LIVER LESION. IMAGE GUIDED TISSUE ABLATION WITH MICROWAVE TECHNOLOGY COMPARISON:  MRI 08/25/2018 MEDICATIONS: NONE ANESTHESIA/SEDATION: General - as administered by the Anesthesia department The patient was continuously monitored during  the procedure by the interventional radiology nurse under my direct supervision. FLUOROSCOPY TIME:  CT COMPLICATIONS: NONE TECHNIQUE: Informed written consent was obtained from the patient after a thorough discussion of the procedural risks, benefits and alternatives. All questions were addressed. Maximal Sterile Barrier Technique was utilized including caps, mask, sterile gowns, sterile gloves, sterile drape, hand hygiene and skin antiseptic. A timeout was performed prior to the initiation of the procedure. Patient is brought to the CT scanner, with the correct patient identified with the correct consent form identified. He was placed on to the CT gantry table and anesthesia team was present for induction of general anesthesia. The patient was then scanned for scout CT. Ultrasound was performed with images stored sent to PACs. Patient was then prepped and draped  in the usual sterile fashion. Using ultrasound guidance, fiducial needles were placed into the liver from an anterior approach targeting the lesion of the right liver lobe adjacent to the portal vein bifurcation. Using stepwise ultrasound and CT imaging, a 20 cm biopsy needle and a 20 cm new wave microwave probe were positioned at the lesion. Positioning required 2 separate contrast CT to be performed. The first with 100 cc of contrast and dual phase imaging, and the second with 80 cc of contrast and dual phase imaging. Once the biopsy needle was positioned on the near site of the lesion, multiple 18 gauge core biopsy were performed. Final repositioning of the new wave probe was performed. The first treatment was performed on the inferior and deep aspect of the lesion. This was performed at 1 minute, 95 watts and then 10 minutes 65 watts for 11 minutes of treatment time. Repeat CT was performed. Needle was then repositioned slightly more superior. Once we confirmed needle position, a second treatment was performed. This was performed at 1 minute 95 watts,  and then 10 minutes 65 watts for total of 11 minutes treatment time. All needles and treatment probes were removed. Final CT was performed. Patient tolerated procedure well and remained hemodynamically stable throughout. No complications were encountered and no significant blood loss. FINDINGS: Images during the case demonstrate targeting of lesion at the level of the portal vein bifurcation. Given the difficulty visualizing this lesion, the prior MRI was used for geographic targeting, as well as the landmarks of the portal vein for geographic targeting. IMPRESSION: Status post CT-guided biopsy of liver lesion and ablation of the lesion with microwave technology using 2 separate treatment times. Signed, Dulcy Fanny. Dellia Nims, RPVI [facility] Radiology Electronically Signed   By: Corrie Mckusick D.O.   On: 09/29/2018 13:24    Labs:  CBC: Recent Labs    06/23/18 1448 09/21/18 1543 09/29/18 0700 10/13/18 1021  WBC 11.9* 11.5* 11.9* 13.0*  HGB 12.7* 12.4* 13.2 13.5  HCT 38.5 39.4 40.9 40.9  PLT 188 193 202 210    COAGS: Recent Labs    09/29/18 0700  INR 0.97    BMP: Recent Labs    06/23/18 1448 07/12/18 1515 08/25/18 1223 09/21/18 1543 09/29/18 0700  NA 137 137  --  136 136  K 3.4* 3.4*  --  4.4 3.2*  CL 95* 96*  --  94* 97*  CO2 30 31  --  32 28  GLUCOSE 256* 214*  --  327* 211*  BUN 21 22  --  23 18  CALCIUM 9.4 10.0  --  9.9 9.4  CREATININE 1.06 1.29* 1.00 1.21 1.01  GFRNONAA >60 57*  --  >60 >60  GFRAA >60 >60  --  >60 >60    LIVER FUNCTION TESTS: Recent Labs    06/23/18 1448 09/29/18 0700  BILITOT 0.3 0.7  AST 17 26  ALT 22 21  ALKPHOS 87 79  PROT 7.3 7.5  ALBUMIN 3.3* 3.6    TUMOR MARKERS: No results for input(s): AFPTM, CEA, CA199, CHROMGRNA in the last 8760 hours.  Assessment and Plan: Metastatic colon cancer to the liver. Plan for port placement today Labs ok Risks and benefits of image guided  port-a-catheter placement was discussed with the patient including, but not limited to bleeding, infection, pneumothorax, or fibrin sheath development and need for additional procedures.  All of the patient's questions were answered, patient is agreeable to proceed. Consent signed and  in chart.    Thank you for this interesting consult.  I greatly enjoyed meeting Jadden S Stevenson and look forward to participating in their care.  A copy of this report was sent to the requesting provider on this date.  Electronically Signed: Ascencion Dike, PA-C 10/13/2018, 11:02 AM   I spent a total of 20 minutes in face to face in clinical consultation, greater than 50% of which was counseling/coordinating care for port placement

## 2018-10-13 NOTE — Discharge Instructions (Addendum)
Implanted Port Insertion, Care After °This sheet gives you information about how to care for yourself after your procedure. Your health care provider may also give you more specific instructions. If you have problems or questions, contact your health care provider. °What can I expect after the procedure? °After your procedure, it is common to have: °· Discomfort at the port insertion site. °· Bruising on the skin over the port. This should improve over 3-4 days. ° °Follow these instructions at home: °Port care °· After your port is placed, you will get a manufacturer's information card. The card has information about your port. Keep this card with you at all times. °· Take care of the port as told by your health care provider. Ask your health care provider if you or a family member can get training for taking care of the port at home. A home health care nurse may also take care of the port. °· Make sure to remember what type of port you have. °Incision care °· Follow instructions from your health care provider about how to take care of your port insertion site. Make sure you: °? Wash your hands with soap and water before you change your bandage (dressing). If soap and water are not available, use hand sanitizer. °? Change your dressing as told by your health care provider. °? Leave stitches (sutures), skin glue, or adhesive strips in place. These skin closures may need to stay in place for 2 weeks or longer. If adhesive strip edges start to loosen and curl up, you may trim the loose edges. Do not remove adhesive strips completely unless your health care provider tells you to do that. °· Check your port insertion site every day for signs of infection. Check for: °? More redness, swelling, or pain. °? More fluid or blood. °? Warmth. °? Pus or a bad smell. °General instructions °· Do not take baths, swim, or use a hot tub until your health care provider approves. °· Do not lift anything that is heavier than 10 lb (4.5  kg) for a week, or as told by your health care provider. °· Ask your health care provider when it is okay to: °? Return to work or school. °? Resume usual physical activities or sports. °· Do not drive for 24 hours if you were given a medicine to help you relax (sedative). °· Take over-the-counter and prescription medicines only as told by your health care provider. °· Wear a medical alert bracelet in case of an emergency. This will tell any health care providers that you have a port. °· Keep all follow-up visits as told by your health care provider. This is important. °Contact a health care provider if: °· You cannot flush your port with saline as directed, or you cannot draw blood from the port. °· You have a fever or chills. °· You have more redness, swelling, or pain around your port insertion site. °· You have more fluid or blood coming from your port insertion site. °· Your port insertion site feels warm to the touch. °· You have pus or a bad smell coming from the port insertion site. °Get help right away if: °· You have chest pain or shortness of breath. °· You have bleeding from your port that you cannot control. °Summary °· Take care of the port as told by your health care provider. °· Change your dressing as told by your health care provider. °· Keep all follow-up visits as told by your health care provider. °  This information is not intended to replace advice given to you by your health care provider. Make sure you discuss any questions you have with your health care provider. °Document Released: 08/24/2013 Document Revised: 09/24/2016 Document Reviewed: 09/24/2016 °Elsevier Interactive Patient Education © 2017 [employer] °Moderate Conscious Sedation, Adult, Care After °These instructions provide you with information about caring for yourself after your procedure. Your health care provider may also give you more specific instructions. Your treatment has been planned according to current medical  practices, but problems sometimes occur. Call your health care provider if you have any problems or questions after your procedure. °What can I expect after the procedure? °After your procedure, it is common: °· To feel sleepy for several hours. °· To feel clumsy and have poor balance for several hours. °· To have poor judgment for several hours. °· To vomit if you eat too soon. ° °Follow these instructions at home: °For at least 24 hours after the procedure: ° °· Do not: °? Participate in activities where you could fall or become injured. °? Drive. °? Use heavy machinery. °? Drink alcohol. °? Take sleeping pills or medicines that cause drowsiness. °? Make important decisions or sign legal documents. °? Take care of children on your own. °· Rest. °Eating and drinking °· Follow the diet recommended by your health care provider. °· If you vomit: °? Drink water, juice, or soup when you can drink without vomiting. °? Make sure you have little or no nausea before eating solid foods. °General instructions °· Have a responsible adult stay with you until you are awake and alert. °· Take over-the-counter and prescription medicines only as told by your health care provider. °· If you smoke, do not smoke without supervision. °· Keep all follow-up visits as told by your health care provider. This is important. °Contact a health care provider if: °· You keep feeling nauseous or you keep vomiting. °· You feel light-headed. °· You develop a rash. °· You have a fever. °Get help right away if: °· You have trouble breathing. °This information is not intended to replace advice given to you by your health care provider. Make sure you discuss any questions you have with your health care provider. °Document Released: 08/24/2013 Document Revised: 04/07/2016 Document Reviewed: 02/23/2016 °Elsevier Interactive Patient Education © 2018 [employer] ° °

## 2018-10-18 DIAGNOSIS — C186 Malignant neoplasm of descending colon: Secondary | ICD-10-CM | POA: Diagnosis not present

## 2018-10-19 ENCOUNTER — Inpatient Hospital Stay: Payer: Medicare Other

## 2018-10-19 ENCOUNTER — Encounter: Payer: Self-pay | Admitting: Nurse Practitioner

## 2018-10-19 ENCOUNTER — Inpatient Hospital Stay: Payer: Medicare Other | Attending: Oncology

## 2018-10-19 ENCOUNTER — Inpatient Hospital Stay (HOSPITAL_BASED_OUTPATIENT_CLINIC_OR_DEPARTMENT_OTHER): Payer: Medicare Other | Admitting: Nurse Practitioner

## 2018-10-19 VITALS — BP 141/90 | HR 72 | Temp 98.2°F | Resp 20 | Ht 74.0 in | Wt 300.3 lb

## 2018-10-19 DIAGNOSIS — R739 Hyperglycemia, unspecified: Secondary | ICD-10-CM | POA: Diagnosis not present

## 2018-10-19 DIAGNOSIS — C787 Secondary malignant neoplasm of liver and intrahepatic bile duct: Secondary | ICD-10-CM | POA: Insufficient documentation

## 2018-10-19 DIAGNOSIS — I10 Essential (primary) hypertension: Secondary | ICD-10-CM

## 2018-10-19 DIAGNOSIS — C186 Malignant neoplasm of descending colon: Secondary | ICD-10-CM | POA: Diagnosis not present

## 2018-10-19 DIAGNOSIS — Z8041 Family history of malignant neoplasm of ovary: Secondary | ICD-10-CM

## 2018-10-19 DIAGNOSIS — F329 Major depressive disorder, single episode, unspecified: Secondary | ICD-10-CM | POA: Diagnosis not present

## 2018-10-19 DIAGNOSIS — Z803 Family history of malignant neoplasm of breast: Secondary | ICD-10-CM | POA: Diagnosis not present

## 2018-10-19 LAB — CBC WITH DIFFERENTIAL (CANCER CENTER ONLY)
Abs Immature Granulocytes: 0.07 10*3/uL (ref 0.00–0.07)
Basophils Absolute: 0.1 10*3/uL (ref 0.0–0.1)
Basophils Relative: 1 %
Eosinophils Absolute: 0.5 10*3/uL (ref 0.0–0.5)
Eosinophils Relative: 4 %
HCT: 41 % (ref 39.0–52.0)
Hemoglobin: 13.5 g/dL (ref 13.0–17.0)
Immature Granulocytes: 1 %
Lymphocytes Relative: 16 %
Lymphs Abs: 1.8 10*3/uL (ref 0.7–4.0)
MCH: 28.5 pg (ref 26.0–34.0)
MCHC: 32.9 g/dL (ref 30.0–36.0)
MCV: 86.7 fL (ref 80.0–100.0)
Monocytes Absolute: 0.9 10*3/uL (ref 0.1–1.0)
Monocytes Relative: 7 %
Neutro Abs: 8.4 10*3/uL — ABNORMAL HIGH (ref 1.7–7.7)
Neutrophils Relative %: 71 %
Platelet Count: 186 10*3/uL (ref 150–400)
RBC: 4.73 MIL/uL (ref 4.22–5.81)
RDW: 13.2 % (ref 11.5–15.5)
WBC Count: 11.7 10*3/uL — ABNORMAL HIGH (ref 4.0–10.5)
nRBC: 0 % (ref 0.0–0.2)

## 2018-10-19 LAB — CMP (CANCER CENTER ONLY)
ALT: 23 U/L (ref 0–44)
AST: 24 U/L (ref 15–41)
Albumin: 3.5 g/dL (ref 3.5–5.0)
Alkaline Phosphatase: 108 U/L (ref 38–126)
Anion gap: 9 (ref 5–15)
BUN: 17 mg/dL (ref 8–23)
CO2: 32 mmol/L (ref 22–32)
Calcium: 10 mg/dL (ref 8.9–10.3)
Chloride: 96 mmol/L — ABNORMAL LOW (ref 98–111)
Creatinine: 1.06 mg/dL (ref 0.61–1.24)
GFR, Est AFR Am: >60 mL/min (ref >60)
GFR, Estimated: >60 mL/min (ref >60)
Glucose, Bld: 158 mg/dL — ABNORMAL HIGH (ref 70–99)
Potassium: 3.6 mmol/L (ref 3.5–5.1)
Sodium: 137 mmol/L (ref 135–145)
Total Bilirubin: 0.7 mg/dL (ref 0.3–1.2)
Total Protein: 7.5 g/dL (ref 6.5–8.1)

## 2018-10-19 LAB — CEA (IN HOUSE-CHCC): CEA (CHCC-In House): 14.72 ng/mL — ABNORMAL HIGH (ref 0.00–5.00)

## 2018-10-19 MED ORDER — LIDOCAINE-PRILOCAINE 2.5-2.5 % EX CREA: TOPICAL_CREAM | CUTANEOUS | 1 refills | Status: DC

## 2018-10-19 MED ORDER — PROCHLORPERAZINE MALEATE 10 MG PO TABS: 10.0000 mg | ORAL_TABLET | Freq: Four times a day (QID) | ORAL | 0 refills | Status: DC | PRN

## 2018-10-19 NOTE — Progress Notes (Addendum)
  Jeremy Stevenson OFFICE PROGRESS NOTE   Diagnosis: Colon cancer  INTERVAL HISTORY:   Jeremy Stevenson returns as scheduled.  He overall is feeling well.  No nausea or vomiting.  Bowels moving regularly.  Objective:  Vital signs in last 24 hours:  Blood pressure (!) 141/90, pulse 72, temperature 98.2 F (36.8 C), temperature source Oral, resp. rate 20, height 6' 2" (1.88 m), weight (!) 300 lb 4.8 oz (136.2 kg), SpO2 99 %.    Resp: Lungs clear bilaterally. Cardio: Regular rate and rhythm. GI: Abdomen soft and nontender.  No hepatomegaly. Vascular: No leg edema. Port-A-Cath without erythema.  Lab Results:  Lab Results  Component Value Date   WBC 11.7 (H) 10/19/2018   HGB 13.5 10/19/2018   HCT 41.0 10/19/2018   MCV 86.7 10/19/2018   PLT 186 10/19/2018   NEUTROABS 8.4 (H) 10/19/2018    Imaging:  No results found.  Medications: I have reviewed the patient's current medications.  Assessment/Plan: 1. Adenocarcinoma of the descending colon, stage II (T3 N0), status post a left colectomy 01/20/2017 ? MSI-stable, no loss of mismatch repair protein expression ? Elevated preoperative CEA ? Incomplete preoperative colonoscopy ? Colonoscopy 08/07/2017-multiple polyps removed including tubular adenomas ? CT abdomen/pelvis 02/05/2018-no evidence of metastatic disease. ? Elevated CEA June 2019 ? CTs 06/29/2018-no evidence of metastatic disease, stable mild bilateral iliac adenopathy ? PET scan 2/95/6213-[id number] hypermetabolic central right liver metastasis;asymmetric hypermetabolism right palatine tonsil with associated mild asymmetric soft tissue fullness on CT images ? MRI liver 08/25/2018- 2.6 cm mass in the central right liver consistent with metastasis, no other evidence of metastatic disease ? Ablation and biopsy of right liver lesion 09/29/2018-pathology revealed adenocarcinoma; foundation 1-KRAS G12V, NRAS wildtype, microsatellite stable, tumor mutational burden  1 ? Cycle 1 FOLFOX 10/25/2018  2. Enlargement of the right testicle-hydrocele-Testicular ultrasound 02/26/2017-negative for mass, small bilateral hydroceles  3. Hypertension  4. Depression  5. Family history of breast and ovarian cancer, negative genetic testing  6.Left leg edema, pain.  7.Pigmented lesion right lower leg-refer to dermatology 06/30/2018  8.  Port-A-Cath placement 10/13/2018   Disposition: Jeremy Stevenson appears stable.  He is scheduled to begin FOLFOX 10/25/2018.  We again reviewed potential toxicities.  He has attended the chemotherapy education class.  Prescriptions were sent to his pharmacy for Compazine and EMLA cream.  We will see him in follow-up prior to cycle 2 on 11/09/2018.  He will contact the office in the interim with any problems.  Patient seen with Dr. Benay Spice.    Jeremy Stevenson ANP/GNP-BC   10/19/2018  2:14 PM  This was a shared visit with Jeremy Stevenson.  Jeremy Stevenson has undergone Port-A-Cath placement.  He is scheduled to begin FOLFOX chemotherapy 10/25/2018.  Jeremy Manson, MD

## 2018-10-20 ENCOUNTER — Telehealth: Payer: Self-pay | Admitting: Oncology

## 2018-10-20 ENCOUNTER — Encounter: Payer: Self-pay | Admitting: Pharmacist

## 2018-10-20 NOTE — Telephone Encounter (Signed)
Added appointments for January per 12/3 los. Spoke with patient and per patient called wife re appointments. Also per patient made wife's number [phone]) the primary contact. Also confirmed next appointment for 12/9. Patient to get updated schedule at that visit.

## 2018-10-24 ENCOUNTER — Other Ambulatory Visit: Payer: Self-pay | Admitting: Oncology

## 2018-10-25 ENCOUNTER — Encounter: Payer: Self-pay | Admitting: Oncology

## 2018-10-25 ENCOUNTER — Inpatient Hospital Stay: Payer: Medicare Other

## 2018-10-25 VITALS — BP 159/89 | HR 67 | Temp 98.6°F | Resp 16 | Ht 74.0 in | Wt 300.0 lb

## 2018-10-25 DIAGNOSIS — C186 Malignant neoplasm of descending colon: Secondary | ICD-10-CM

## 2018-10-25 DIAGNOSIS — I10 Essential (primary) hypertension: Secondary | ICD-10-CM | POA: Diagnosis not present

## 2018-10-25 DIAGNOSIS — Z803 Family history of malignant neoplasm of breast: Secondary | ICD-10-CM | POA: Diagnosis not present

## 2018-10-25 DIAGNOSIS — R739 Hyperglycemia, unspecified: Secondary | ICD-10-CM | POA: Diagnosis not present

## 2018-10-25 DIAGNOSIS — C787 Secondary malignant neoplasm of liver and intrahepatic bile duct: Secondary | ICD-10-CM | POA: Diagnosis not present

## 2018-10-25 DIAGNOSIS — F329 Major depressive disorder, single episode, unspecified: Secondary | ICD-10-CM | POA: Diagnosis not present

## 2018-10-25 MED ORDER — FLUOROURACIL CHEMO INJECTION 2.5 GM/50ML
400.0000 mg/m2 | Freq: Once | INTRAVENOUS | Status: AC
  Administered 2018-10-25: 1050 mg via INTRAVENOUS
  Filled 2018-10-25: qty 21

## 2018-10-25 MED ORDER — DEXTROSE 5 % IV SOLN
Freq: Once | INTRAVENOUS | Status: AC
  Administered 2018-10-25: 09:00:00 via INTRAVENOUS
  Filled 2018-10-25: qty 250

## 2018-10-25 MED ORDER — SODIUM CHLORIDE 0.9 % IV SOLN
2400.0000 mg/m2 | INTRAVENOUS | Status: DC
  Administered 2018-10-25: 6400 mg via INTRAVENOUS
  Filled 2018-10-25: qty 128

## 2018-10-25 MED ORDER — PALONOSETRON HCL INJECTION 0.25 MG/5ML
INTRAVENOUS | Status: AC
  Filled 2018-10-25: qty 5

## 2018-10-25 MED ORDER — DEXAMETHASONE SODIUM PHOSPHATE 10 MG/ML IJ SOLN
INTRAMUSCULAR | Status: AC
  Filled 2018-10-25: qty 1

## 2018-10-25 MED ORDER — OXALIPLATIN CHEMO INJECTION 100 MG/20ML
85.0000 mg/m2 | Freq: Once | INTRAVENOUS | Status: AC
  Administered 2018-10-25: 225 mg via INTRAVENOUS
  Filled 2018-10-25: qty 40

## 2018-10-25 MED ORDER — DEXAMETHASONE SODIUM PHOSPHATE 10 MG/ML IJ SOLN
10.0000 mg | Freq: Once | INTRAMUSCULAR | Status: AC
  Administered 2018-10-25: 10 mg via INTRAVENOUS

## 2018-10-25 MED ORDER — DEXTROSE 5 % IV SOLN
Freq: Once | INTRAVENOUS | Status: DC
  Filled 2018-10-25: qty 250

## 2018-10-25 MED ORDER — PALONOSETRON HCL INJECTION 0.25 MG/5ML
0.2500 mg | Freq: Once | INTRAVENOUS | Status: AC
  Administered 2018-10-25: 0.25 mg via INTRAVENOUS

## 2018-10-25 MED ORDER — LEUCOVORIN CALCIUM INJECTION 350 MG
400.0000 mg/m2 | Freq: Once | INTRAVENOUS | Status: AC
  Administered 2018-10-25: 1068 mg via INTRAVENOUS
  Filled 2018-10-25: qty 53.4

## 2018-10-25 NOTE — Progress Notes (Signed)
Went to infusion to meet with patient to introduce myself as Arboriculturist and offer available resources.  Discussed one-time $700 Engineer, drilling. Also gave Duanne [place] application for patients whom live in [place]. Patient states his spouse stepped away.   Gave him my card for any additional financial questions or concerns along with grant information on the back. He verbalized understanding.

## 2018-10-25 NOTE — Patient Instructions (Addendum)
[place] Discharge Instructions for Patients Receiving Chemotherapy  Today you received the following chemotherapy agents: Oxaliplatin (Eloxatin), Leucovorin, Fluorouracil (Adrucil, 5-FU)  To help prevent nausea and vomiting after your treatment, we encourage you to take your nausea medication as directed.    If you develop nausea and vomiting that is not controlled by your nausea medication, call the clinic.   BELOW ARE SYMPTOMS THAT SHOULD BE REPORTED IMMEDIATELY:  *FEVER GREATER THAN 100.5 F  *CHILLS WITH OR WITHOUT FEVER  NAUSEA AND VOMITING THAT IS NOT CONTROLLED WITH YOUR NAUSEA MEDICATION  *UNUSUAL SHORTNESS OF BREATH  *UNUSUAL BRUISING OR BLEEDING  TENDERNESS IN MOUTH AND THROAT WITH OR WITHOUT PRESENCE OF ULCERS  *URINARY PROBLEMS  *BOWEL PROBLEMS  UNUSUAL RASH Items with * indicate a potential emergency and should be followed up as soon as possible.  Feel free to call the clinic should you have any questions or concerns. The clinic phone number is [phone].  Please show the [place] at check-in to the Emergency Department and triage nurse.  [place] Discharge Instructions for Patients receiving Home Portable Chemo Pump    **The bag should finish at 46 hours, 96 hours or 7 days. For example, if your pump is scheduled for 46 hours and it was put on at 4pm, it should finish at 2 pm the day it is scheduled to come off regardless of your appointment time.    Estimated time to finish   _________________________ (Have your nurse fill in)     ** if the display on your pump reads "Low Volume" and it is beeping, take the batteries out of the pump and come to the cancer center for it to be taken off.   **If the pump alarms go off prior to the pump reading "Low Volume" then call the [phone] and someone can assist you.  **If the plunger comes out and the bag fluid is running out, please use your chemo spill kit to  clean up the spill. Do not use paper towels or other house hold products.  ** If you have problems or questions regarding your pump, please call either the [phone] or the cancer center Monday-Friday 8:00am-4:30pm at [phone] and we will assist you.  If you are unable to get assistance then go to [facility] Emergency Room, ask the staff to contact the IV team for assistance.     Oxaliplatin Injection What is this medicine? OXALIPLATIN (ox AL i PLA tin) is a chemotherapy drug. It targets fast dividing cells, like cancer cells, and causes these cells to die. This medicine is used to treat cancers of the colon and rectum, and many other cancers. This medicine may be used for other purposes; ask your health care provider or pharmacist if you have questions. COMMON BRAND NAME(S): Eloxatin What should I tell my health care provider before I take this medicine? They need to know if you have any of these conditions: -kidney disease -an unusual or allergic reaction to oxaliplatin, other chemotherapy, other medicines, foods, dyes, or preservatives -pregnant or trying to get pregnant -breast-feeding How should I use this medicine? This drug is given as an infusion into a vein. It is administered in a hospital or clinic by a specially trained health care professional. Talk to your pediatrician regarding the use of this medicine in children. Special care may be needed. Overdosage: If you think you have taken too much of this medicine contact a poison control center or  emergency room at once. NOTE: This medicine is only for you. Do not share this medicine with others. What if I miss a dose? It is important not to miss a dose. Call your doctor or health care professional if you are unable to keep an appointment. What may interact with this medicine? -medicines to increase blood counts like filgrastim, pegfilgrastim, sargramostim -probenecid -some antibiotics like amikacin, gentamicin,  neomycin, polymyxin B, streptomycin, tobramycin -zalcitabine Talk to your doctor or health care professional before taking any of these medicines: -acetaminophen -aspirin -ibuprofen -ketoprofen -naproxen This list may not describe all possible interactions. Give your health care provider a list of all the medicines, herbs, non-prescription drugs, or dietary supplements you use. Also tell them if you smoke, drink alcohol, or use illegal drugs. Some items may interact with your medicine. What should I watch for while using this medicine? Your condition will be monitored carefully while you are receiving this medicine. You will need important blood work done while you are taking this medicine. This medicine can make you more sensitive to cold. Do not drink cold drinks or use ice. Cover exposed skin before coming in contact with cold temperatures or cold objects. When out in cold weather wear warm clothing and cover your mouth and nose to warm the air that goes into your lungs. Tell your doctor if you get sensitive to the cold. This drug may make you feel generally unwell. This is not uncommon, as chemotherapy can affect healthy cells as well as cancer cells. Report any side effects. Continue your course of treatment even though you feel ill unless your doctor tells you to stop. In some cases, you may be given additional medicines to help with side effects. Follow all directions for their use. Call your doctor or health care professional for advice if you get a fever, chills or sore throat, or other symptoms of a cold or flu. Do not treat yourself. This drug decreases your body's ability to fight infections. Try to avoid being around people who are sick. This medicine may increase your risk to bruise or bleed. Call your doctor or health care professional if you notice any unusual bleeding. Be careful brushing and flossing your teeth or using a toothpick because you may get an infection or bleed more  easily. If you have any dental work done, tell your dentist you are receiving this medicine. Avoid taking products that contain aspirin, acetaminophen, ibuprofen, naproxen, or ketoprofen unless instructed by your doctor. These medicines may hide a fever. Do not become pregnant while taking this medicine. Women should inform their doctor if they wish to become pregnant or think they might be pregnant. There is a potential for serious side effects to an unborn child. Talk to your health care professional or pharmacist for more information. Do not breast-feed an infant while taking this medicine. Call your doctor or health care professional if you get diarrhea. Do not treat yourself. What side effects may I notice from receiving this medicine? Side effects that you should report to your doctor or health care professional as soon as possible: -allergic reactions like skin rash, itching or hives, swelling of the face, lips, or tongue -low blood counts - This drug may decrease the number of white blood cells, red blood cells and platelets. You may be at increased risk for infections and bleeding. -signs of infection - fever or chills, cough, sore throat, pain or difficulty passing urine -signs of decreased platelets or bleeding - bruising, pinpoint red spots  on the skin, black, tarry stools, nosebleeds -signs of decreased red blood cells - unusually weak or tired, fainting spells, lightheadedness -breathing problems -chest pain, pressure -cough -diarrhea -jaw tightness -mouth sores -nausea and vomiting -pain, swelling, redness or irritation at the injection site -pain, tingling, numbness in the hands or feet -problems with balance, talking, walking -redness, blistering, peeling or loosening of the skin, including inside the mouth -trouble passing urine or change in the amount of urine Side effects that usually do not require medical attention (report to your doctor or health care professional if  they continue or are bothersome): -changes in vision -constipation -hair loss -loss of appetite -metallic taste in the mouth or changes in taste -stomach pain This list may not describe all possible side effects. Call your doctor for medical advice about side effects. You may report side effects to FDA at 1-800-FDA-1088. Where should I keep my medicine? This drug is given in a hospital or clinic and will not be stored at home. NOTE: This sheet is a summary. It may not cover all possible information. If you have questions about this medicine, talk to your doctor, pharmacist, or health care provider.  2018 Elsevier/Gold Standard (2008-05-30 17:22:47)  Leucovorin injection What is this medicine? LEUCOVORIN (loo koe VOR in) is used to prevent or treat the harmful effects of some medicines. This medicine is used to treat anemia caused by a low amount of folic acid in the body. It is also used with 5-fluorouracil (5-FU) to treat colon cancer. This medicine may be used for other purposes; ask your health care provider or pharmacist if you have questions. What should I tell my health care provider before I take this medicine? They need to know if you have any of these conditions: -anemia from low levels of vitamin B-12 in the blood -an unusual or allergic reaction to leucovorin, folic acid, other medicines, foods, dyes, or preservatives -pregnant or trying to get pregnant -breast-feeding How should I use this medicine? This medicine is for injection into a muscle or into a vein. It is given by a health care professional in a hospital or clinic setting. Talk to your pediatrician regarding the use of this medicine in children. Special care may be needed. Overdosage: If you think you have taken too much of this medicine contact a poison control center or emergency room at once. NOTE: This medicine is only for you. Do not share this medicine with others. What if I miss a dose? This does not  apply. What may interact with this medicine? -capecitabine -fluorouracil -phenobarbital -phenytoin -primidone -trimethoprim-sulfamethoxazole This list may not describe all possible interactions. Give your health care provider a list of all the medicines, herbs, non-prescription drugs, or dietary supplements you use. Also tell them if you smoke, drink alcohol, or use illegal drugs. Some items may interact with your medicine. What should I watch for while using this medicine? Your condition will be monitored carefully while you are receiving this medicine. This medicine may increase the side effects of 5-fluorouracil, 5-FU. Tell your doctor or health care professional if you have diarrhea or mouth sores that do not get better or that get worse. What side effects may I notice from receiving this medicine? Side effects that you should report to your doctor or health care professional as soon as possible: -allergic reactions like skin rash, itching or hives, swelling of the face, lips, or tongue -breathing problems -fever, infection -mouth sores -unusual bleeding or bruising -unusually weak or tired Side  effects that usually do not require medical attention (report to your doctor or health care professional if they continue or are bothersome): -constipation or diarrhea -loss of appetite -nausea, vomiting This list may not describe all possible side effects. Call your doctor for medical advice about side effects. You may report side effects to FDA at 1-800-FDA-1088. Where should I keep my medicine? This drug is given in a hospital or clinic and will not be stored at home. NOTE: This sheet is a summary. It may not cover all possible information. If you have questions about this medicine, talk to your doctor, pharmacist, or health care provider.  2018 Elsevier/Gold Standard (2008-05-09 16:50:29)  Fluorouracil, 5-FU injection What is this medicine? FLUOROURACIL, 5-FU (flure oh YOOR a sil) is  a chemotherapy drug. It slows the growth of cancer cells. This medicine is used to treat many types of cancer like breast cancer, colon or rectal cancer, pancreatic cancer, and stomach cancer. This medicine may be used for other purposes; ask your health care provider or pharmacist if you have questions. COMMON BRAND NAME(S): Adrucil What should I tell my health care provider before I take this medicine? They need to know if you have any of these conditions: -blood disorders -dihydropyrimidine dehydrogenase (DPD) deficiency -infection (especially a virus infection such as chickenpox, cold sores, or herpes) -kidney disease -liver disease -malnourished, poor nutrition -recent or ongoing radiation therapy -an unusual or allergic reaction to fluorouracil, other chemotherapy, other medicines, foods, dyes, or preservatives -pregnant or trying to get pregnant -breast-feeding How should I use this medicine? This drug is given as an infusion or injection into a vein. It is administered in a hospital or clinic by a specially trained health care professional. Talk to your pediatrician regarding the use of this medicine in children. Special care may be needed. Overdosage: If you think you have taken too much of this medicine contact a poison control center or emergency room at once. NOTE: This medicine is only for you. Do not share this medicine with others. What if I miss a dose? It is important not to miss your dose. Call your doctor or health care professional if you are unable to keep an appointment. What may interact with this medicine? -allopurinol -cimetidine -dapsone -digoxin -hydroxyurea -leucovorin -levamisole -medicines for seizures like ethotoin, fosphenytoin, phenytoin -medicines to increase blood counts like filgrastim, pegfilgrastim, sargramostim -medicines that treat or prevent blood clots like warfarin, enoxaparin, and  dalteparin -methotrexate -metronidazole -pyrimethamine -some other chemotherapy drugs like busulfan, cisplatin, estramustine, vinblastine -trimethoprim -trimetrexate -vaccines Talk to your doctor or health care professional before taking any of these medicines: -acetaminophen -aspirin -ibuprofen -ketoprofen -naproxen This list may not describe all possible interactions. Give your health care provider a list of all the medicines, herbs, non-prescription drugs, or dietary supplements you use. Also tell them if you smoke, drink alcohol, or use illegal drugs. Some items may interact with your medicine. What should I watch for while using this medicine? Visit your doctor for checks on your progress. This drug may make you feel generally unwell. This is not uncommon, as chemotherapy can affect healthy cells as well as cancer cells. Report any side effects. Continue your course of treatment even though you feel ill unless your doctor tells you to stop. In some cases, you may be given additional medicines to help with side effects. Follow all directions for their use. Call your doctor or health care professional for advice if you get a fever, chills or sore throat, or other  symptoms of a cold or flu. Do not treat yourself. This drug decreases your body's ability to fight infections. Try to avoid being around people who are sick. This medicine may increase your risk to bruise or bleed. Call your doctor or health care professional if you notice any unusual bleeding. Be careful brushing and flossing your teeth or using a toothpick because you may get an infection or bleed more easily. If you have any dental work done, tell your dentist you are receiving this medicine. Avoid taking products that contain aspirin, acetaminophen, ibuprofen, naproxen, or ketoprofen unless instructed by your doctor. These medicines may hide a fever. Do not become pregnant while taking this medicine. Women should inform their  doctor if they wish to become pregnant or think they might be pregnant. There is a potential for serious side effects to an unborn child. Talk to your health care professional or pharmacist for more information. Do not breast-feed an infant while taking this medicine. Men should inform their doctor if they wish to father a child. This medicine may lower sperm counts. Do not treat diarrhea with over the counter products. Contact your doctor if you have diarrhea that lasts more than 2 days or if it is severe and watery. This medicine can make you more sensitive to the sun. Keep out of the sun. If you cannot avoid being in the sun, wear protective clothing and use sunscreen. Do not use sun lamps or tanning beds/booths. What side effects may I notice from receiving this medicine? Side effects that you should report to your doctor or health care professional as soon as possible: -allergic reactions like skin rash, itching or hives, swelling of the face, lips, or tongue -low blood counts - this medicine may decrease the number of white blood cells, red blood cells and platelets. You may be at increased risk for infections and bleeding. -signs of infection - fever or chills, cough, sore throat, pain or difficulty passing urine -signs of decreased platelets or bleeding - bruising, pinpoint red spots on the skin, black, tarry stools, blood in the urine -signs of decreased red blood cells - unusually weak or tired, fainting spells, lightheadedness -breathing problems -changes in vision -chest pain -mouth sores -nausea and vomiting -pain, swelling, redness at site where injected -pain, tingling, numbness in the hands or feet -redness, swelling, or sores on hands or feet -stomach pain -unusual bleeding Side effects that usually do not require medical attention (report to your doctor or health care professional if they continue or are bothersome): -changes in finger or toe nails -diarrhea -dry or itchy  skin -hair loss -headache -loss of appetite -sensitivity of eyes to the light -stomach upset -unusually teary eyes This list may not describe all possible side effects. Call your doctor for medical advice about side effects. You may report side effects to FDA at 1-800-FDA-1088. Where should I keep my medicine? This drug is given in a hospital or clinic and will not be stored at home. NOTE: This sheet is a summary. It may not cover all possible information. If you have questions about this medicine, talk to your doctor, pharmacist, or health care provider.  2018 Elsevier/Gold Standard (2008-03-08 13:53:16)

## 2018-10-26 ENCOUNTER — Telehealth: Payer: Self-pay | Admitting: *Deleted

## 2018-10-26 NOTE — Telephone Encounter (Signed)
Left VM for patient to return call

## 2018-10-27 ENCOUNTER — Telehealth: Payer: Self-pay | Admitting: *Deleted

## 2018-10-27 ENCOUNTER — Inpatient Hospital Stay: Payer: Medicare Other

## 2018-10-27 VITALS — BP 142/82 | HR 68 | Temp 98.2°F | Resp 18

## 2018-10-27 DIAGNOSIS — Z803 Family history of malignant neoplasm of breast: Secondary | ICD-10-CM | POA: Diagnosis not present

## 2018-10-27 DIAGNOSIS — I10 Essential (primary) hypertension: Secondary | ICD-10-CM | POA: Diagnosis not present

## 2018-10-27 DIAGNOSIS — C186 Malignant neoplasm of descending colon: Secondary | ICD-10-CM

## 2018-10-27 DIAGNOSIS — C787 Secondary malignant neoplasm of liver and intrahepatic bile duct: Secondary | ICD-10-CM | POA: Diagnosis not present

## 2018-10-27 DIAGNOSIS — R739 Hyperglycemia, unspecified: Secondary | ICD-10-CM | POA: Diagnosis not present

## 2018-10-27 DIAGNOSIS — F329 Major depressive disorder, single episode, unspecified: Secondary | ICD-10-CM | POA: Diagnosis not present

## 2018-10-27 MED ORDER — SODIUM CHLORIDE 0.9% FLUSH
10.0000 mL | INTRAVENOUS | Status: DC | PRN
  Administered 2018-10-27: 10 mL
  Filled 2018-10-27: qty 10

## 2018-10-27 MED ORDER — HEPARIN SOD (PORK) LOCK FLUSH 100 UNIT/ML IV SOLN
500.0000 [IU] | Freq: Once | INTRAVENOUS | Status: AC | PRN
  Administered 2018-10-27: 500 [IU]
  Filled 2018-10-27: qty 5

## 2018-10-27 NOTE — Telephone Encounter (Signed)
Wife reports he is doing well. No N/V and is able to eat and drink without problem. Bowels moved well today. They have no questions. Instructed her to call for any problems or questions.

## 2018-11-03 ENCOUNTER — Encounter: Payer: Self-pay | Admitting: Radiology

## 2018-11-03 ENCOUNTER — Ambulatory Visit
Admission: RE | Admit: 2018-11-03 | Discharge: 2018-11-03 | Disposition: A | Discharge status: Home / Self Care | Payer: Medicare Other | Source: Ambulatory Visit | Attending: Radiology | Admitting: Radiology

## 2018-11-03 DIAGNOSIS — C787 Secondary malignant neoplasm of liver and intrahepatic bile duct: Secondary | ICD-10-CM | POA: Diagnosis not present

## 2018-11-03 DIAGNOSIS — R160 Hepatomegaly, not elsewhere classified: Secondary | ICD-10-CM

## 2018-11-03 DIAGNOSIS — Z9889 Other specified postprocedural states: Secondary | ICD-10-CM | POA: Diagnosis not present

## 2018-11-03 DIAGNOSIS — C186 Malignant neoplasm of descending colon: Secondary | ICD-10-CM

## 2018-11-03 DIAGNOSIS — C19 Malignant neoplasm of rectosigmoid junction: Secondary | ICD-10-CM | POA: Diagnosis not present

## 2018-11-03 HISTORY — PX: IR RADIOLOGIST EVAL & MGMT: IMG5224

## 2018-11-03 NOTE — Progress Notes (Addendum)
Chief Complaint: Metastatic colorectal carcinoma  Referring Physician(s): Dr Benay Spice   History of Present Illness: Jeremy Stevenson is a 67 y.o. male presenting as a scheduled follow up to [place] [facility], SP image guided ablation of right hepatic lesion, path-proven to be metastatic colorectal carcinoma.    We first met [name] 09/07/2018 when he was referred for the imaging finding of oligo-metastatic disease to the liver.    He underwent image guided ablation with microwave technology, performed 09/29/2018 with general anesthesia.  He was admitted overnight, 23 observation, and went home the following day.  He tells me he did just fine and never had any abdominal symptoms afterwards. He denies any fever, or abdominal pain.   He does tell me that he had some nauseas and appetite suppression once he initiated chemotherapy, but feels like he had no problems related to our treatment with ablation.    We biopsied the lesion during the microwave ablation, and this was confirmed to be adenocarcinoma compatible with the primary.    He has not yet had follow up CT scanning.    Past Medical History:  Diagnosis Date  . Depression   . Genetic testing 03/16/2017   [name] underwent genetic counseling and testing for hereditary cancer syndromes on 03/03/2017. His results were negative for mutations in all 46 genes analyzed by [employer]'s Common Hereditary Cancers Panel. Genes analyzed include: APC, ATM, AXIN2, BARD1, BMPR1A, BRCA1, BRCA2, BRIP1, CDH1, CDKN2A, CHEK2, CTNNA1, DICER1, EPCAM, GREM1, HOXB13, KIT, MEN1, MLH1, MSH2, MSH3, MSH6, MUTYH, NBN, NF1, NT  . Hypertension   . 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 COLONOSCOPY N/A 01/02/2017   Procedure: COLONOSCOPY;  Surgeon: Rogene Houston, MD;  Location: [facility];  Service: Endoscopy;  Laterality: N/A;  1000  . COLONOSCOPY N/A 08/07/2017   Procedure:  COLONOSCOPY;  Surgeon: Rogene Houston, MD;  Location: [facility];  Service: Endoscopy;  Laterality: N/A;  1045 - per Lelon Frohlich LM of new time  . HERNIA REPAIR     67 years old  . IR IMAGING GUIDED PORT INSERTION  10/13/2018  . IR RADIOLOGIST EVAL & MGMT  09/07/2018  . IR RADIOLOGIST EVAL & MGMT  11/03/2018  . LAPAROSCOPIC SIGMOID COLECTOMY N/A 01/20/2017   Procedure: LAPAROSCOPIC LEFT COLECTOMY, TAKEDOWN SPLENIC FLEXURE, AND BIOPSY OF PERITONEAL NODULE;  Surgeon: Fanny Skates, MD;  Location: [facility];  Service: General;  Laterality: N/A;  . RADIOLOGY WITH ANESTHESIA N/A 09/29/2018   Procedure: CT WITH ANESTHESIA/MICROWAVE THERMAL ABLATION OF LIVER, BIOPSY;  Surgeon: Corrie Mckusick, DO;  Location: [facility];  Service: Anesthesiology;  Laterality: N/A;  . TONSILLECTOMY     67 years old    Allergies: Codeine  Medications: Prior to Admission medications   Medication Sig Start Date End Date Taking? Authorizing Provider  amLODipine (NORVASC) 5 MG tablet Take 5 mg by mouth daily.  10/05/16  Yes [provider]  chlorthalidone (HYGROTON) 25 MG tablet Take 25 mg by mouth daily.    Yes [provider]  citalopram (CELEXA) 20 MG tablet Take 20 mg by mouth daily.  12/06/16  Yes [provider]  lidocaine-prilocaine (EMLA) cream Apply to portacath site 1 hour prior to use 10/19/18  Yes Owens Shark, NP  LORazepam (ATIVAN) 0.5 MG tablet Take 0.5 mg by mouth 2 (two) times daily as needed for anxiety. 09/09/18  Yes [provider]  losartan (COZAAR) 100  MG tablet Take 100 mg by mouth daily.  11/29/16  Yes [provider]  metoprolol (LOPRESSOR) 100 MG tablet Take 100 mg by mouth 2 (two) times daily.  11/28/16  Yes [provider]  prochlorperazine (COMPAZINE) 10 MG tablet Take 1 tablet (10 mg total) by mouth every 6 (six) hours as needed for nausea or vomiting. 10/19/18  Yes Owens Shark, NP     Family History  Problem Relation Age of Onset  . Breast cancer  Mother 86  . Skin cancer Mother   . Ovarian cancer Sister 80       d.50 due to metastases  . Skin cancer Maternal Uncle   . Skin cancer Cousin 54  . Skin cancer Cousin 48    Social History   Socioeconomic History  . Marital status: Married    Spouse name: Not on file  . Number of children: Not on file  . Years of education: Not on file  . Highest education level: Not on file  Occupational History  . Not on file  Social Needs  . Financial resource strain: Not on file  . Food insecurity:    Worry: Not on file    Inability: Not on file  . Transportation needs:    Medical: Not on file    Non-medical: Not on file  Tobacco Use  . Smoking status: Never Smoker  . Smokeless tobacco: Never Used  Substance and Sexual Activity  . Alcohol use: No  . Drug use: No  . Sexual activity: Yes  Lifestyle  . Physical activity:    Days per week: Not on file    Minutes per session: Not on file  . Stress: Not on file  Relationships  . Social connections:    Talks on phone: Not on file    Gets together: Not on file    Attends religious service: Not on file    Active member of club or organization: Not on file    Attends meetings of clubs or organizations: Not on file    Relationship status: Not on file  Other Topics Concern  . Not on file  Social History Narrative  . Not on file    ECOG Status: 1 - Symptomatic but completely ambulatory  Review of Systems: A 12 point ROS discussed and pertinent positives are indicated in the HPI above.  All other systems are negative.  Review of Systems  Vital Signs: BP (!) 143/90   Pulse 83   Temp 98.7 F (37.1 C) (Oral)   Resp 16   Ht '6\' 2"'  (1.88 m)   Wt 136.1 kg   SpO2 98%   BMI 38.52 kg/m   Physical Exam Targeted exam shows non-tender abdomen, with healed access site.   Mallampati Score:     Imaging: Ir Imaging Guided Port Insertion  Result Date: 10/13/2018 CLINICAL DATA:  Metastatic adenocarcinoma of the descending colon  to the liver and need for porta cath for chemotherapy. EXAM: IMPLANTED PORT A CATH PLACEMENT WITH ULTRASOUND AND FLUOROSCOPIC GUIDANCE ANESTHESIA/SEDATION: 2.0 mg IV Versed; 100 mcg IV Fentanyl Total Moderate Sedation Time:  33 minutes The patient's level of consciousness and physiologic status were continuously monitored during the procedure by Radiology nursing. Additional Medications: 2 g IV Ancef. FLUOROSCOPY TIME:  18 seconds.  10.3 mGy. PROCEDURE: The procedure, risks, benefits, and alternatives were explained to the patient. Questions regarding the procedure were encouraged and answered. The patient understands and consents to the procedure. A time-out was performed prior  to initiating the procedure. Ultrasound was utilized to confirm patency of the right internal jugular vein. The right neck and chest were prepped with chlorhexidine in a sterile fashion, and a sterile drape was applied covering the operative field. Maximum barrier sterile technique with sterile gowns and gloves were used for the procedure. Local anesthesia was provided with 1% lidocaine. After creating a small venotomy incision, a 21 gauge needle was advanced into the right internal jugular vein under direct, real-time ultrasound guidance. Ultrasound image documentation was performed. After securing guidewire access, an 8 Fr dilator was placed. A J-wire was kinked to measure appropriate catheter length. A subcutaneous port pocket was then created along the upper chest wall utilizing sharp and blunt dissection. Portable cautery was utilized. The pocket was irrigated with sterile saline. A single lumen power injectable port was chosen for placement. The 8 Fr catheter was tunneled from the port pocket site to the venotomy incision. The port was placed in the pocket. External catheter was trimmed to appropriate length based on guidewire measurement. At the venotomy, an 8 Fr peel-away sheath was placed over a guidewire. The catheter was then  placed through the sheath and the sheath removed. Final catheter positioning was confirmed and documented with a fluoroscopic spot image. The port was accessed with a needle and aspirated and flushed with heparinized saline. The access needle was removed. The venotomy and port pocket incisions were closed with subcutaneous 3-0 Monocryl and subcuticular 4-0 Vicryl. Dermabond was applied to both incisions. COMPLICATIONS: COMPLICATIONS None FINDINGS: After catheter placement, the tip lies at the cavo-atrial junction. The catheter aspirates normally and is ready for immediate use. IMPRESSION: Placement of single lumen port a cath via right internal jugular vein. The catheter tip lies at the cavo-atrial junction. A power injectable port a cath was placed and is ready for immediate use. Electronically Signed   By: Aletta Edouard M.D.   On: 10/13/2018 13:01   Ir Radiologist Eval & Mgmt  Result Date: 11/03/2018 Please refer to notes tab for details about interventional procedure. (Op Note)   Labs:  CBC: Recent Labs    09/21/18 1543 09/29/18 0700 10/13/18 1021 10/19/18 1153  WBC 11.5* 11.9* 13.0* 11.7*  HGB 12.4* 13.2 13.5 13.5  HCT 39.4 40.9 40.9 41.0  PLT 193 202 210 186    COAGS: Recent Labs    09/29/18 0700  INR 0.97    BMP: Recent Labs    09/21/18 1543 09/29/18 0700 10/13/18 1021 10/19/18 1153  NA 136 136 134* 137  K 4.4 3.2* 3.4* 3.6  CL 94* 97* 96* 96*  CO2 32 28 28 32  GLUCOSE 327* 211* 188* 158*  BUN '23 18 20 17  ' CALCIUM 9.9 9.4 9.3 10.0  CREATININE 1.21 1.01 0.88 1.06  GFRNONAA >60 >60 >60 >60  GFRAA >60 >60 >60 >60    LIVER FUNCTION TESTS: Recent Labs    06/23/18 1448 09/29/18 0700 10/19/18 1153  BILITOT 0.3 0.7 0.7  AST '17 26 24  ' ALT '22 21 23  ' ALKPHOS 87 79 108  PROT 7.3 7.5 7.5  ALBUMIN 3.3* 3.6 3.5    TUMOR MARKERS: No results for input(s): AFPTM, CEA, CA199, CHROMGRNA in the last 8760 hours.  Assessment and Plan:  [name] is a 67 year old  male with path-proven oligo-metastatic colon cancer to the liver, now SP ablation of the single lesion, performed 09/29/2018.  He has done very well after our treatment, having completely recovered with no significant symptoms.  I spent the majority of our discussion outlining our surveillance strategy, which is repeat imaging over time, with the first follow up to be scheduled in about 3-4 months.    He understand our strategy.    Plan: - Repeat office visit in ~4 months, with scheduled contrast enhanced abdominal/pelvic CT (liver protocol) - I have advised him to observe his other doctors appointments.     Electronically Signed: Corrie Mckusick 11/03/2018, 3:49 PM   I spent a total of    25 Minutes in face to face in clinical consultation, greater than 50% of which was counseling/coordinating care for oligometastatic disease to liver, colon cancer, SP microwave ablation and biopsy.

## 2018-11-07 ENCOUNTER — Other Ambulatory Visit: Payer: Self-pay | Admitting: Oncology

## 2018-11-08 ENCOUNTER — Other Ambulatory Visit: Payer: Self-pay | Admitting: *Deleted

## 2018-11-08 DIAGNOSIS — C186 Malignant neoplasm of descending colon: Secondary | ICD-10-CM

## 2018-11-09 ENCOUNTER — Inpatient Hospital Stay: Payer: Medicare Other

## 2018-11-09 ENCOUNTER — Inpatient Hospital Stay (HOSPITAL_BASED_OUTPATIENT_CLINIC_OR_DEPARTMENT_OTHER): Payer: Medicare Other | Admitting: Oncology

## 2018-11-09 VITALS — BP 150/88 | HR 74 | Temp 98.7°F | Resp 17 | Ht 74.0 in | Wt 299.7 lb

## 2018-11-09 DIAGNOSIS — C186 Malignant neoplasm of descending colon: Secondary | ICD-10-CM

## 2018-11-09 DIAGNOSIS — F329 Major depressive disorder, single episode, unspecified: Secondary | ICD-10-CM

## 2018-11-09 DIAGNOSIS — Z803 Family history of malignant neoplasm of breast: Secondary | ICD-10-CM | POA: Diagnosis not present

## 2018-11-09 DIAGNOSIS — L989 Disorder of the skin and subcutaneous tissue, unspecified: Secondary | ICD-10-CM

## 2018-11-09 DIAGNOSIS — R739 Hyperglycemia, unspecified: Secondary | ICD-10-CM | POA: Diagnosis not present

## 2018-11-09 DIAGNOSIS — Z8041 Family history of malignant neoplasm of ovary: Secondary | ICD-10-CM

## 2018-11-09 DIAGNOSIS — I10 Essential (primary) hypertension: Secondary | ICD-10-CM | POA: Diagnosis not present

## 2018-11-09 DIAGNOSIS — C787 Secondary malignant neoplasm of liver and intrahepatic bile duct: Secondary | ICD-10-CM

## 2018-11-09 LAB — CBC WITH DIFFERENTIAL (CANCER CENTER ONLY)
Abs Immature Granulocytes: 0.04 10*3/uL (ref 0.00–0.07)
Basophils Absolute: 0 10*3/uL (ref 0.0–0.1)
Basophils Relative: 1 %
Eosinophils Absolute: 0.5 10*3/uL (ref 0.0–0.5)
Eosinophils Relative: 7 %
HCT: 36.8 % — ABNORMAL LOW (ref 39.0–52.0)
Hemoglobin: 12.2 g/dL — ABNORMAL LOW (ref 13.0–17.0)
Immature Granulocytes: 1 %
Lymphocytes Relative: 21 %
Lymphs Abs: 1.5 10*3/uL (ref 0.7–4.0)
MCH: 28.4 pg (ref 26.0–34.0)
MCHC: 33.2 g/dL (ref 30.0–36.0)
MCV: 85.8 fL (ref 80.0–100.0)
Monocytes Absolute: 0.7 10*3/uL (ref 0.1–1.0)
Monocytes Relative: 10 %
Neutro Abs: 4.4 10*3/uL (ref 1.7–7.7)
Neutrophils Relative %: 60 %
Platelet Count: 147 10*3/uL — ABNORMAL LOW (ref 150–400)
RBC: 4.29 MIL/uL (ref 4.22–5.81)
RDW: 13.8 % (ref 11.5–15.5)
WBC Count: 7.1 10*3/uL (ref 4.0–10.5)
nRBC: 0 % (ref 0.0–0.2)

## 2018-11-09 LAB — CMP (CANCER CENTER ONLY)
ALT: 33 U/L (ref 0–44)
AST: 21 U/L (ref 15–41)
Albumin: 3.1 g/dL — ABNORMAL LOW (ref 3.5–5.0)
Alkaline Phosphatase: 109 U/L (ref 38–126)
Anion gap: 11 (ref 5–15)
BUN: 16 mg/dL (ref 8–23)
CO2: 31 mmol/L (ref 22–32)
Calcium: 9.5 mg/dL (ref 8.9–10.3)
Chloride: 94 mmol/L — ABNORMAL LOW (ref 98–111)
Creatinine: 1.35 mg/dL — ABNORMAL HIGH (ref 0.61–1.24)
GFR, Est AFR Am: >60 mL/min (ref >60)
GFR, Estimated: 55 mL/min — ABNORMAL LOW (ref >60)
Glucose, Bld: 343 mg/dL — ABNORMAL HIGH (ref 70–99)
Potassium: 3.3 mmol/L — ABNORMAL LOW (ref 3.5–5.1)
Sodium: 136 mmol/L (ref 135–145)
Total Bilirubin: 0.5 mg/dL (ref 0.3–1.2)
Total Protein: 6.6 g/dL (ref 6.5–8.1)

## 2018-11-09 MED ORDER — SODIUM CHLORIDE 0.9 % IV SOLN
2400.0000 mg/m2 | INTRAVENOUS | Status: DC
  Administered 2018-11-09: 6400 mg via INTRAVENOUS
  Filled 2018-11-09: qty 128

## 2018-11-09 MED ORDER — FLUOROURACIL CHEMO INJECTION 2.5 GM/50ML
400.0000 mg/m2 | Freq: Once | INTRAVENOUS | Status: AC
  Administered 2018-11-09: 1050 mg via INTRAVENOUS
  Filled 2018-11-09: qty 21

## 2018-11-09 MED ORDER — DEXAMETHASONE SODIUM PHOSPHATE 10 MG/ML IJ SOLN
INTRAMUSCULAR | Status: AC
  Filled 2018-11-09: qty 1

## 2018-11-09 MED ORDER — DEXTROSE 5 % IV SOLN
Freq: Once | INTRAVENOUS | Status: AC
  Administered 2018-11-09: 12:00:00 via INTRAVENOUS
  Filled 2018-11-09: qty 250

## 2018-11-09 MED ORDER — DEXAMETHASONE SODIUM PHOSPHATE 10 MG/ML IJ SOLN
10.0000 mg | Freq: Once | INTRAMUSCULAR | Status: AC
  Administered 2018-11-09: 10 mg via INTRAVENOUS

## 2018-11-09 MED ORDER — OXALIPLATIN CHEMO INJECTION 100 MG/20ML
85.0000 mg/m2 | Freq: Once | INTRAVENOUS | Status: AC
  Administered 2018-11-09: 225 mg via INTRAVENOUS
  Filled 2018-11-09: qty 40

## 2018-11-09 MED ORDER — LORAZEPAM 0.5 MG PO TABS: 0.5000 mg | ORAL_TABLET | Freq: Two times a day (BID) | ORAL | 0 refills | Status: DC | PRN

## 2018-11-09 MED ORDER — PALONOSETRON HCL INJECTION 0.25 MG/5ML
0.2500 mg | Freq: Once | INTRAVENOUS | Status: AC
  Administered 2018-11-09: 0.25 mg via INTRAVENOUS

## 2018-11-09 MED ORDER — POTASSIUM CHLORIDE CRYS ER 20 MEQ PO TBCR: 20.0000 meq | EXTENDED_RELEASE_TABLET | Freq: Every day | ORAL | 0 refills | Status: DC

## 2018-11-09 MED ORDER — LEUCOVORIN CALCIUM INJECTION 350 MG
400.0000 mg/m2 | Freq: Once | INTRAVENOUS | Status: AC
  Administered 2018-11-09: 1068 mg via INTRAVENOUS
  Filled 2018-11-09: qty 53.4

## 2018-11-09 MED ORDER — PALONOSETRON HCL INJECTION 0.25 MG/5ML
INTRAVENOUS | Status: AC
  Filled 2018-11-09: qty 5

## 2018-11-09 MED FILL — POTASSIUM CHLORIDE CRYS ER: 20 | 30 days supply | Qty: 30 | Fill #0

## 2018-11-09 NOTE — Progress Notes (Signed)
MD reviewed labs today: OK to treat with elevated glucose level. Will recheck CBG during premeds today. Patient has been instructed to limit concentrated sweets.

## 2018-11-09 NOTE — Progress Notes (Signed)
[place] OFFICE PROGRESS NOTE   Diagnosis: Colon cancer  INTERVAL HISTORY:   [name] completed cycle 1 FOLFOX on 10/25/2018.  No nausea/vomiting.  He had cold sensitivity following chemotherapy.  This has resolved.  He reports mild mouth soreness.  No diarrhea.  No neuropathy symptoms at present.  Objective:  Vital signs in last 24 hours:  Blood pressure (!) 150/88, pulse 74, temperature 98.7 F (37.1 C), temperature source Oral, resp. rate 17, height _0  (1.88 m), weight 299 lb 11.2 oz (135.9 kg), SpO2 95 %.    HEENT: No thrush or ulcers Resp: Lungs clear bilaterally Cardio: Regular rate and rhythm GI: No hepatomegaly, nontender Vascular: The left lower leg is slightly larger than the right side, no edema  Skin: Dryness of the hands, raised 1/2 cm pigmented lesion at the right lower leg  Portacath/PICC-without erythema  Lab Results:  Lab Results  Component Value Date   WBC 7.1 11/09/2018   HGB 12.2 (L) 11/09/2018   HCT 36.8 (L) 11/09/2018   MCV 85.8 11/09/2018   PLT 147 (L) 11/09/2018   NEUTROABS 4.4 11/09/2018    CMP  Lab Results  Component Value Date   NA 136 11/09/2018   K 3.3 (L) 11/09/2018   CL 94 (L) 11/09/2018   CO2 31 11/09/2018   GLUCOSE 343 (H) 11/09/2018   BUN 16 11/09/2018   CREATININE 1.35 (H) 11/09/2018   CALCIUM 9.5 11/09/2018   PROT 6.6 11/09/2018   ALBUMIN 3.1 (L) 11/09/2018   AST 21 11/09/2018   ALT 33 11/09/2018   ALKPHOS 109 11/09/2018   BILITOT 0.5 11/09/2018   GFRNONAA 55 (L) 11/09/2018   GFRAA >60 11/09/2018    Lab Results  Component Value Date   CEA1 14.72 (H) 10/19/2018     Medications: I have reviewed the patient's current medications.   Assessment/Plan:  1. Adenocarcinoma of the descending colon, stage II (T3 N0), status post a left colectomy 01/20/2017 ? MSI-stable, no loss of mismatch repair protein expression ? Elevated preoperative CEA ? Incomplete preoperative colonoscopy ? Colonoscopy  08/07/2017-multiple polyps removed including tubular adenomas ? CT abdomen/pelvis 02/05/2018-no evidence of metastatic disease. ? Elevated CEA June 2019 ? CTs 06/29/2018-no evidence of metastatic disease, stable mild bilateral iliac adenopathy ? PET scan 03/11/9562-[id number] hypermetabolic central right liver metastasis;asymmetric hypermetabolism right palatine tonsil with associated mild asymmetric soft tissue fullness on CT images ? MRI liver 08/25/2018- 2.6 cm mass in the central right liver consistent with metastasis, no other evidence of metastatic disease ? Ablation and biopsy of right liver lesion 09/29/2018-pathology revealed adenocarcinoma; foundation 1-KRAS G12V, NRAS wildtype, microsatellite stable, tumor mutational burden 1 ? Cycle 1 FOLFOX 10/25/2018 ? Cycle 2 FOLFOX 11/09/2018  2. Enlargement of the right testicle-hydrocele-Testicular ultrasound 02/26/2017-negative for mass, small bilateral hydroceles  3. Hypertension  4. Depression  5. Family history of breast and ovarian cancer, negative genetic testing  6.Left leg edema, pain.  7.Pigmented lesion right lower leg-refer to dermatology 06/30/2018  8.  Port-A-Cath placement 10/13/2018    Disposition: [name] tolerated the first cycle of FOLFOX well.  He will complete cycle 2 today.  The blood sugar is elevated today.  We will repeat a blood sugar while he is in the chemotherapy room today.  We will also repeat the blood sugar when he is here for the pump disconnect on 11/11/2018.  I recommended he avoid concentrated sweets and schedule an appointment with Dr. Brigitte Pulse to evaluate him for diabetes.  [name] will schedule  a dermatology appointment to evaluate the pigmented lesion at the right leg.  He will return for an office visit and the next cycle of FOLFOX on 11/23/2017.  We will check the CEA when he returns on 11/23/2017.  25 minutes were spent with the patient today.  The majority of the time was used for  counseling and coordination of care.  Betsy Coder, MD  11/09/2018  11:19 AM

## 2018-11-09 NOTE — Progress Notes (Signed)
CBG recheck was 221

## 2018-11-09 NOTE — Progress Notes (Signed)
Informed patient/wife that MD wants him to take K+ 20 meq daily beginning today. His local pharmacy is closed. He agrees to go to [facility] for this fill. Informed him they will be closed at 4pm today.

## 2018-11-09 NOTE — Patient Instructions (Signed)
[place] Discharge Instructions for Patients Receiving Chemotherapy  Today you received the following chemotherapy agents: Oxaliplatin (Eloxatin), Leucovorin, and Fluorouracil (Adrucil, 5-FU)  To help prevent nausea and vomiting after your treatment, we encourage you to take your nausea medication as directed.    If you develop nausea and vomiting that is not controlled by your nausea medication, call the clinic.   BELOW ARE SYMPTOMS THAT SHOULD BE REPORTED IMMEDIATELY:  *FEVER GREATER THAN 100.5 F  *CHILLS WITH OR WITHOUT FEVER  NAUSEA AND VOMITING THAT IS NOT CONTROLLED WITH YOUR NAUSEA MEDICATION  *UNUSUAL SHORTNESS OF BREATH  *UNUSUAL BRUISING OR BLEEDING  TENDERNESS IN MOUTH AND THROAT WITH OR WITHOUT PRESENCE OF ULCERS  *URINARY PROBLEMS  *BOWEL PROBLEMS  UNUSUAL RASH Items with * indicate a potential emergency and should be followed up as soon as possible.  Feel free to call the clinic should you have any questions or concerns. The clinic phone number is [phone].  Please show the [place] at check-in to the Emergency Department and triage nurse.  [place] Discharge Instructions for Patients receiving Home Portable Chemo Pump   **The bag should finish at 46 hours, 96 hours or 7 days. For example, if your pump is scheduled for 46 hours and it was put on at 4pm, it should finish at 2 pm the day it is scheduled to come off regardless of your appointment time.    Estimated time to finish   _________________________ (Have your nurse fill in)     ** if the display on your pump reads "Low Volume" and it is beeping, take the batteries out of the pump and come to the cancer center for it to be taken off.   **If the pump alarms go off prior to the pump reading "Low Volume" then call the [phone] and someone can assist you.  **If the plunger comes out and the bag fluid is running out, please use your chemo spill kit to  clean up the spill. Do not use paper towels or other house hold products.  ** If you have problems or questions regarding your pump, please call either the [phone] or the cancer center Monday-Friday 8:00am-4:30pm at [phone] and we will assist you.  If you are unable to get assistance then go to [facility] Room, ask the staff to contact the IV team for assistance.

## 2018-11-09 NOTE — Addendum Note (Signed)
Addended by: Tania Ade on: 11/09/2018 01:12 PM   Modules accepted: Orders

## 2018-11-11 ENCOUNTER — Inpatient Hospital Stay: Payer: Medicare Other

## 2018-11-11 ENCOUNTER — Telehealth: Payer: Self-pay | Admitting: Oncology

## 2018-11-11 DIAGNOSIS — R739 Hyperglycemia, unspecified: Secondary | ICD-10-CM | POA: Diagnosis not present

## 2018-11-11 DIAGNOSIS — C186 Malignant neoplasm of descending colon: Secondary | ICD-10-CM

## 2018-11-11 DIAGNOSIS — I10 Essential (primary) hypertension: Secondary | ICD-10-CM | POA: Diagnosis not present

## 2018-11-11 DIAGNOSIS — C787 Secondary malignant neoplasm of liver and intrahepatic bile duct: Secondary | ICD-10-CM | POA: Diagnosis not present

## 2018-11-11 DIAGNOSIS — Z803 Family history of malignant neoplasm of breast: Secondary | ICD-10-CM | POA: Diagnosis not present

## 2018-11-11 DIAGNOSIS — F329 Major depressive disorder, single episode, unspecified: Secondary | ICD-10-CM | POA: Diagnosis not present

## 2018-11-11 DIAGNOSIS — Z95828 Presence of other vascular implants and grafts: Secondary | ICD-10-CM

## 2018-11-11 LAB — GLUCOSE, CAPILLARY: Glucose-Capillary: 221 mg/dL — ABNORMAL HIGH (ref 70–99)

## 2018-11-11 LAB — BASIC METABOLIC PANEL - CANCER CENTER ONLY
Anion gap: 10 (ref 5–15)
BUN: 30 mg/dL — ABNORMAL HIGH (ref 8–23)
CO2: 32 mmol/L (ref 22–32)
Calcium: 9.5 mg/dL (ref 8.9–10.3)
Chloride: 95 mmol/L — ABNORMAL LOW (ref 98–111)
Creatinine: 1.36 mg/dL — ABNORMAL HIGH (ref 0.61–1.24)
GFR, Est AFR Am: >60 mL/min (ref >60)
GFR, Estimated: 54 mL/min — ABNORMAL LOW (ref >60)
Glucose, Bld: 183 mg/dL — ABNORMAL HIGH (ref 70–99)
Potassium: 3.5 mmol/L (ref 3.5–5.1)
Sodium: 137 mmol/L (ref 135–145)

## 2018-11-11 MED ORDER — HEPARIN SOD (PORK) LOCK FLUSH 100 UNIT/ML IV SOLN
500.0000 [IU] | Freq: Once | INTRAVENOUS | Status: AC
  Administered 2018-11-11: 500 [IU] via INTRAVENOUS
  Filled 2018-11-11: qty 5

## 2018-11-11 MED ORDER — SODIUM CHLORIDE 0.9% FLUSH
10.0000 mL | INTRAVENOUS | Status: DC | PRN
  Administered 2018-11-11: 10 mL via INTRAVENOUS
  Filled 2018-11-11: qty 10

## 2018-11-11 NOTE — Telephone Encounter (Signed)
Scheduled appt per 12/24 los -pt to get an updated schedule next visit.   

## 2018-11-11 NOTE — Patient Instructions (Signed)

## 2018-11-20 ENCOUNTER — Other Ambulatory Visit: Payer: Self-pay | Admitting: Oncology

## 2018-11-23 ENCOUNTER — Inpatient Hospital Stay: Payer: Medicare Other

## 2018-11-23 ENCOUNTER — Inpatient Hospital Stay: Payer: Medicare Other | Attending: Oncology | Admitting: Nurse Practitioner

## 2018-11-23 ENCOUNTER — Encounter: Payer: Self-pay | Admitting: Nurse Practitioner

## 2018-11-23 ENCOUNTER — Telehealth: Payer: Self-pay | Admitting: Nurse Practitioner

## 2018-11-23 VITALS — BP 142/86 | HR 73 | Temp 99.0°F | Resp 18 | Ht 74.0 in | Wt 296.1 lb

## 2018-11-23 DIAGNOSIS — Z8041 Family history of malignant neoplasm of ovary: Secondary | ICD-10-CM

## 2018-11-23 DIAGNOSIS — Z803 Family history of malignant neoplasm of breast: Secondary | ICD-10-CM

## 2018-11-23 DIAGNOSIS — I10 Essential (primary) hypertension: Secondary | ICD-10-CM | POA: Diagnosis not present

## 2018-11-23 DIAGNOSIS — C186 Malignant neoplasm of descending colon: Secondary | ICD-10-CM | POA: Insufficient documentation

## 2018-11-23 DIAGNOSIS — Z5111 Encounter for antineoplastic chemotherapy: Secondary | ICD-10-CM | POA: Insufficient documentation

## 2018-11-23 DIAGNOSIS — C787 Secondary malignant neoplasm of liver and intrahepatic bile duct: Secondary | ICD-10-CM | POA: Diagnosis not present

## 2018-11-23 DIAGNOSIS — Z79899 Other long term (current) drug therapy: Secondary | ICD-10-CM | POA: Diagnosis not present

## 2018-11-23 DIAGNOSIS — F329 Major depressive disorder, single episode, unspecified: Secondary | ICD-10-CM | POA: Diagnosis not present

## 2018-11-23 LAB — CBC WITH DIFFERENTIAL (CANCER CENTER ONLY)
Abs Immature Granulocytes: 0.03 10*3/uL (ref 0.00–0.07)
Basophils Absolute: 0 10*3/uL (ref 0.0–0.1)
Basophils Relative: 0 %
Eosinophils Absolute: 0.1 10*3/uL (ref 0.0–0.5)
Eosinophils Relative: 2 %
HCT: 36.6 % — ABNORMAL LOW (ref 39.0–52.0)
Hemoglobin: 12 g/dL — ABNORMAL LOW (ref 13.0–17.0)
Immature Granulocytes: 0 %
Lymphocytes Relative: 18 %
Lymphs Abs: 1.4 10*3/uL (ref 0.7–4.0)
MCH: 28.9 pg (ref 26.0–34.0)
MCHC: 32.8 g/dL (ref 30.0–36.0)
MCV: 88.2 fL (ref 80.0–100.0)
Monocytes Absolute: 0.7 10*3/uL (ref 0.1–1.0)
Monocytes Relative: 10 %
Neutro Abs: 5.1 10*3/uL (ref 1.7–7.7)
Neutrophils Relative %: 70 %
Platelet Count: 133 10*3/uL — ABNORMAL LOW (ref 150–400)
RBC: 4.15 MIL/uL — ABNORMAL LOW (ref 4.22–5.81)
RDW: 15 % (ref 11.5–15.5)
WBC Count: 7.4 10*3/uL (ref 4.0–10.5)
nRBC: 0 % (ref 0.0–0.2)

## 2018-11-23 LAB — CMP (CANCER CENTER ONLY)
ALT: 36 U/L (ref 0–44)
AST: 28 U/L (ref 15–41)
Albumin: 3.1 g/dL — ABNORMAL LOW (ref 3.5–5.0)
Alkaline Phosphatase: 124 U/L (ref 38–126)
Anion gap: 13 (ref 5–15)
BUN: 17 mg/dL (ref 8–23)
CO2: 23 mmol/L (ref 22–32)
Calcium: 9.3 mg/dL (ref 8.9–10.3)
Chloride: 98 mmol/L (ref 98–111)
Creatinine: 1.22 mg/dL (ref 0.61–1.24)
GFR, Est AFR Am: >60 mL/min (ref >60)
GFR, Estimated: >60 mL/min (ref >60)
Glucose, Bld: 238 mg/dL — ABNORMAL HIGH (ref 70–99)
Potassium: 3.9 mmol/L (ref 3.5–5.1)
Sodium: 134 mmol/L — ABNORMAL LOW (ref 135–145)
Total Bilirubin: 0.6 mg/dL (ref 0.3–1.2)
Total Protein: 7 g/dL (ref 6.5–8.1)

## 2018-11-23 LAB — CEA (IN HOUSE-CHCC): CEA (CHCC-In House): 5.52 ng/mL — ABNORMAL HIGH (ref 0.00–5.00)

## 2018-11-23 MED ORDER — DEXTROSE 5 % IV SOLN
Freq: Once | INTRAVENOUS | Status: AC
  Administered 2018-11-23: 10:00:00 via INTRAVENOUS
  Filled 2018-11-23: qty 250

## 2018-11-23 MED ORDER — OXALIPLATIN CHEMO INJECTION 100 MG/20ML
85.0000 mg/m2 | Freq: Once | INTRAVENOUS | Status: AC
  Administered 2018-11-23: 225 mg via INTRAVENOUS
  Filled 2018-11-23: qty 40

## 2018-11-23 MED ORDER — LEUCOVORIN CALCIUM INJECTION 350 MG
400.0000 mg/m2 | Freq: Once | INTRAVENOUS | Status: AC
  Administered 2018-11-23: 1068 mg via INTRAVENOUS
  Filled 2018-11-23: qty 53.4

## 2018-11-23 MED ORDER — FLUOROURACIL CHEMO INJECTION 2.5 GM/50ML
400.0000 mg/m2 | Freq: Once | INTRAVENOUS | Status: AC
  Administered 2018-11-23: 1050 mg via INTRAVENOUS
  Filled 2018-11-23: qty 21

## 2018-11-23 MED ORDER — DEXAMETHASONE SODIUM PHOSPHATE 10 MG/ML IJ SOLN
10.0000 mg | Freq: Once | INTRAMUSCULAR | Status: AC
  Administered 2018-11-23: 10 mg via INTRAVENOUS

## 2018-11-23 MED ORDER — PALONOSETRON HCL INJECTION 0.25 MG/5ML
INTRAVENOUS | Status: AC
  Filled 2018-11-23: qty 5

## 2018-11-23 MED ORDER — PALONOSETRON HCL INJECTION 0.25 MG/5ML
0.2500 mg | Freq: Once | INTRAVENOUS | Status: AC
  Administered 2018-11-23: 0.25 mg via INTRAVENOUS

## 2018-11-23 MED ORDER — DEXAMETHASONE SODIUM PHOSPHATE 10 MG/ML IJ SOLN
INTRAMUSCULAR | Status: AC
  Filled 2018-11-23: qty 1

## 2018-11-23 MED ORDER — SODIUM CHLORIDE 0.9 % IV SOLN
2400.0000 mg/m2 | INTRAVENOUS | Status: DC
  Administered 2018-11-23: 6400 mg via INTRAVENOUS
  Filled 2018-11-23: qty 128

## 2018-11-23 NOTE — Progress Notes (Signed)
  [facility]rem[facility] OFFICE PROGRESS NOTE   Diagnosis: Colon cancer  INTERVAL HISTORY:   [name] returns as scheduled.  He completed cycle 2 FOLFOX 11/09/2018.  He denies nausea/vomiting.  No mouth sores.  No diarrhea.  Cold sensitivity lasted about 4 days.  No numbness or tingling otherwise.  He has modified his diet and is avoiding concentrated sweets.  Objective:  Vital signs in last 24 hours:  Blood pressure (!) 142/86, pulse 73, temperature 99 F (37.2 C), temperature source Oral, resp. rate 18, height '6\' 2"'$  (1.88 m), weight 296 lb 1.6 oz (134.3 kg), SpO2 97 %.    HEENT: No thrush or ulcers. Resp: Lungs clear bilaterally. Cardio: Regular rate and rhythm. GI: Abdomen soft and nontender.  No hepatomegaly. Vascular: No leg edema.  Skin: Palms without erythema. Port-A-Cath without erythema.   Lab Results:  Lab Results  Component Value Date   WBC 7.4 11/23/2018   HGB 12.0 (L) 11/23/2018   HCT 36.6 (L) 11/23/2018   MCV 88.2 11/23/2018   PLT 133 (L) 11/23/2018   NEUTROABS 5.1 11/23/2018    Imaging:  No results found.  Medications: I have reviewed the patient's current medications.  Assessment/Plan: 1. Adenocarcinoma of the descending colon, stage II (T3 N0), status post a left colectomy 01/20/2017 ? MSI-stable, no loss of mismatch repair protein expression ? Elevated preoperative CEA ? Incomplete preoperative colonoscopy ? Colonoscopy 08/07/2017-multiple polyps removed including tubular adenomas ? CT abdomen/pelvis 02/05/2018-no evidence of metastatic disease. ? Elevated CEA June 2019 ? CTs 06/29/2018-no evidence of metastatic disease, stable mild bilateral iliac adenopathy ? PET scan 7/68/0881-[id number] hypermetabolic central right liver metastasis;asymmetric hypermetabolism right palatine tonsil with associated mild asymmetric soft tissue fullness on CT images ? MRI liver 08/25/2018-2.6 cm mass in the central right liver consistent with metastasis, no  other evidence of metastatic disease ? Ablation and biopsy of right liver lesion 09/29/2018-pathology revealed adenocarcinoma; foundation 1-KRAS G12V, NRAS wildtype, microsatellite stable, tumor mutational burden 1 ? Cycle 1 FOLFOX 10/25/2018 ? Cycle 2 FOLFOX 11/09/2018 ? Cycle 3 FOLFOX 11/23/2018  2. Enlargement of the right testicle-hydrocele-Testicular ultrasound 02/26/2017-negative for mass, small bilateral hydroceles  3. Hypertension  4. Depression  5. Family history of breast and ovarian cancer, negative genetic testing  6.Left leg edema, pain.  7.Pigmented lesion right lower leg-refer to dermatology 06/30/2018  8.  Port-A-Cath placement 10/13/2018   Disposition: [name] appears stable.  He has completed 2 cycles of FOLFOX.  He continues to tolerate the chemotherapy well.  Plan to proceed with cycle 3 today as scheduled.  We reviewed the CBC from today.  Counts are adequate for treatment.  Chemistry panel is pending.  He will return for lab, follow-up and cycle 4 FOLFOX in 2 weeks.  He will contact the office in the interim with any problems.  I encouraged him to schedule an appointment with his PCP to evaluate the elevated blood sugar at the time of his last visit.    [name] ANP/GNP-BC   11/23/2018  9:54 AM

## 2018-11-23 NOTE — Telephone Encounter (Signed)
Printed calendar and avs. °

## 2018-11-23 NOTE — Patient Instructions (Signed)
[facility] Discharge Instructions for Patients Receiving Chemotherapy  Today you received the following chemotherapy agents Oxaliplatin (ELOXATIN), Leucovorin & Flourouracil (ADRUCIL).  To help prevent nausea and vomiting after your treatment, we encourage you to take your nausea medication as prescribed.   If you develop nausea and vomiting that is not controlled by your nausea medication, call the clinic.   BELOW ARE SYMPTOMS THAT SHOULD BE REPORTED IMMEDIATELY:  *FEVER GREATER THAN 100.5 F  *CHILLS WITH OR WITHOUT FEVER  NAUSEA AND VOMITING THAT IS NOT CONTROLLED WITH YOUR NAUSEA MEDICATION  *UNUSUAL SHORTNESS OF BREATH  *UNUSUAL BRUISING OR BLEEDING  TENDERNESS IN MOUTH AND THROAT WITH OR WITHOUT PRESENCE OF ULCERS  *URINARY PROBLEMS  *BOWEL PROBLEMS  UNUSUAL RASH Items with * indicate a potential emergency and should be followed up as soon as possible.  Feel free to call the clinic should you have any questions or concerns. The clinic phone number is [phone].  Please show the CHEMO ALERT CARD at check-in to the Emergency Department and triage nurse.   

## 2018-11-25 ENCOUNTER — Inpatient Hospital Stay: Payer: Medicare Other

## 2018-11-25 VITALS — BP 146/81 | HR 65 | Temp 98.1°F | Resp 20

## 2018-11-25 DIAGNOSIS — C186 Malignant neoplasm of descending colon: Secondary | ICD-10-CM | POA: Diagnosis not present

## 2018-11-25 DIAGNOSIS — C787 Secondary malignant neoplasm of liver and intrahepatic bile duct: Secondary | ICD-10-CM | POA: Diagnosis not present

## 2018-11-25 DIAGNOSIS — Z5111 Encounter for antineoplastic chemotherapy: Secondary | ICD-10-CM | POA: Diagnosis not present

## 2018-11-25 MED ORDER — HEPARIN SOD (PORK) LOCK FLUSH 100 UNIT/ML IV SOLN
500.0000 [IU] | Freq: Once | INTRAVENOUS | Status: AC | PRN
  Administered 2018-11-25: 500 [IU]
  Filled 2018-11-25: qty 5

## 2018-11-25 MED ORDER — SODIUM CHLORIDE 0.9% FLUSH
10.0000 mL | INTRAVENOUS | Status: DC | PRN
  Administered 2018-11-25: 10 mL
  Filled 2018-11-25: qty 10

## 2018-11-26 ENCOUNTER — Telehealth: Payer: Self-pay | Admitting: *Deleted

## 2018-11-26 NOTE — Telephone Encounter (Signed)
Received fax from [facility] that he has an appointment on 01/11/19 at 1045 with Celedonio Savage, PA-C. He was aware.

## 2018-11-27 ENCOUNTER — Encounter (HOSPITAL_COMMUNITY): Payer: Self-pay | Admitting: Oncology

## 2018-12-04 ENCOUNTER — Other Ambulatory Visit: Payer: Self-pay | Admitting: Oncology

## 2018-12-06 ENCOUNTER — Telehealth: Payer: Self-pay | Admitting: Oncology

## 2018-12-06 NOTE — Telephone Encounter (Signed)
Called patient in regards to his appts 02/14.  Patient stated they will get a new calendar when they come in 1/21.

## 2018-12-07 ENCOUNTER — Inpatient Hospital Stay: Payer: Medicare Other

## 2018-12-07 ENCOUNTER — Encounter: Payer: Self-pay | Admitting: Nurse Practitioner

## 2018-12-07 ENCOUNTER — Inpatient Hospital Stay (HOSPITAL_BASED_OUTPATIENT_CLINIC_OR_DEPARTMENT_OTHER): Payer: Medicare Other | Admitting: Nurse Practitioner

## 2018-12-07 VITALS — BP 173/96 | HR 72 | Temp 97.9°F | Resp 17 | Ht 74.0 in | Wt 298.3 lb

## 2018-12-07 VITALS — BP 147/92

## 2018-12-07 DIAGNOSIS — Z5111 Encounter for antineoplastic chemotherapy: Secondary | ICD-10-CM | POA: Diagnosis not present

## 2018-12-07 DIAGNOSIS — Z8041 Family history of malignant neoplasm of ovary: Secondary | ICD-10-CM | POA: Diagnosis not present

## 2018-12-07 DIAGNOSIS — C186 Malignant neoplasm of descending colon: Secondary | ICD-10-CM

## 2018-12-07 DIAGNOSIS — Z95828 Presence of other vascular implants and grafts: Secondary | ICD-10-CM

## 2018-12-07 DIAGNOSIS — C787 Secondary malignant neoplasm of liver and intrahepatic bile duct: Secondary | ICD-10-CM | POA: Diagnosis not present

## 2018-12-07 DIAGNOSIS — D696 Thrombocytopenia, unspecified: Secondary | ICD-10-CM

## 2018-12-07 DIAGNOSIS — F329 Major depressive disorder, single episode, unspecified: Secondary | ICD-10-CM

## 2018-12-07 DIAGNOSIS — I10 Essential (primary) hypertension: Secondary | ICD-10-CM

## 2018-12-07 DIAGNOSIS — R600 Localized edema: Secondary | ICD-10-CM | POA: Diagnosis not present

## 2018-12-07 DIAGNOSIS — Z803 Family history of malignant neoplasm of breast: Secondary | ICD-10-CM | POA: Diagnosis not present

## 2018-12-07 LAB — CBC WITH DIFFERENTIAL (CANCER CENTER ONLY)
Abs Immature Granulocytes: 0.04 10*3/uL (ref 0.00–0.07)
Basophils Absolute: 0 10*3/uL (ref 0.0–0.1)
Basophils Relative: 1 %
Eosinophils Absolute: 0.2 10*3/uL (ref 0.0–0.5)
Eosinophils Relative: 2 %
HCT: 35.5 % — ABNORMAL LOW (ref 39.0–52.0)
Hemoglobin: 11.8 g/dL — ABNORMAL LOW (ref 13.0–17.0)
Immature Granulocytes: 1 %
Lymphocytes Relative: 16 %
Lymphs Abs: 1.4 10*3/uL (ref 0.7–4.0)
MCH: 29.4 pg (ref 26.0–34.0)
MCHC: 33.2 g/dL (ref 30.0–36.0)
MCV: 88.5 fL (ref 80.0–100.0)
Monocytes Absolute: 0.9 10*3/uL (ref 0.1–1.0)
Monocytes Relative: 11 %
Neutro Abs: 6.2 10*3/uL (ref 1.7–7.7)
Neutrophils Relative %: 69 %
Platelet Count: 108 10*3/uL — ABNORMAL LOW (ref 150–400)
RBC: 4.01 MIL/uL — ABNORMAL LOW (ref 4.22–5.81)
RDW: 16.3 % — ABNORMAL HIGH (ref 11.5–15.5)
WBC Count: 8.8 10*3/uL (ref 4.0–10.5)
nRBC: 0 % (ref 0.0–0.2)

## 2018-12-07 LAB — CMP (CANCER CENTER ONLY)
ALT: 38 U/L (ref 0–44)
AST: 30 U/L (ref 15–41)
Albumin: 3.1 g/dL — ABNORMAL LOW (ref 3.5–5.0)
Alkaline Phosphatase: 129 U/L — ABNORMAL HIGH (ref 38–126)
Anion gap: 9 (ref 5–15)
BUN: 16 mg/dL (ref 8–23)
CO2: 27 mmol/L (ref 22–32)
Calcium: 9.2 mg/dL (ref 8.9–10.3)
Chloride: 99 mmol/L (ref 98–111)
Creatinine: 1.01 mg/dL (ref 0.61–1.24)
GFR, Est AFR Am: >60 mL/min (ref >60)
GFR, Estimated: >60 mL/min (ref >60)
Glucose, Bld: 176 mg/dL — ABNORMAL HIGH (ref 70–99)
Potassium: 3.7 mmol/L (ref 3.5–5.1)
Sodium: 135 mmol/L (ref 135–145)
Total Bilirubin: 0.6 mg/dL (ref 0.3–1.2)
Total Protein: 6.6 g/dL (ref 6.5–8.1)

## 2018-12-07 MED ORDER — DEXTROSE 5 % IV SOLN
Freq: Once | INTRAVENOUS | Status: AC
  Administered 2018-12-07: 13:00:00 via INTRAVENOUS
  Filled 2018-12-07: qty 250

## 2018-12-07 MED ORDER — SODIUM CHLORIDE 0.9 % IV SOLN
2400.0000 mg/m2 | INTRAVENOUS | Status: DC
  Administered 2018-12-07: 6400 mg via INTRAVENOUS
  Filled 2018-12-07: qty 128

## 2018-12-07 MED ORDER — DEXAMETHASONE SODIUM PHOSPHATE 10 MG/ML IJ SOLN
10.0000 mg | Freq: Once | INTRAMUSCULAR | Status: AC
  Administered 2018-12-07: 10 mg via INTRAVENOUS

## 2018-12-07 MED ORDER — OXALIPLATIN CHEMO INJECTION 100 MG/20ML
85.0000 mg/m2 | Freq: Once | INTRAVENOUS | Status: AC
  Administered 2018-12-07: 225 mg via INTRAVENOUS
  Filled 2018-12-07: qty 40

## 2018-12-07 MED ORDER — LEUCOVORIN CALCIUM INJECTION 350 MG
400.0000 mg/m2 | Freq: Once | INTRAVENOUS | Status: AC
  Administered 2018-12-07: 1068 mg via INTRAVENOUS
  Filled 2018-12-07: qty 53.4

## 2018-12-07 MED ORDER — SODIUM CHLORIDE 0.9% FLUSH
10.0000 mL | INTRAVENOUS | Status: DC | PRN
  Administered 2018-12-07: 10 mL via INTRAVENOUS
  Filled 2018-12-07: qty 10

## 2018-12-07 MED ORDER — PALONOSETRON HCL INJECTION 0.25 MG/5ML
0.2500 mg | Freq: Once | INTRAVENOUS | Status: AC
  Administered 2018-12-07: 0.25 mg via INTRAVENOUS

## 2018-12-07 MED ORDER — PALONOSETRON HCL INJECTION 0.25 MG/5ML
INTRAVENOUS | Status: AC
  Filled 2018-12-07: qty 5

## 2018-12-07 MED ORDER — DEXAMETHASONE SODIUM PHOSPHATE 10 MG/ML IJ SOLN
INTRAMUSCULAR | Status: AC
  Filled 2018-12-07: qty 1

## 2018-12-07 MED ORDER — FLUOROURACIL CHEMO INJECTION 2.5 GM/50ML
400.0000 mg/m2 | Freq: Once | INTRAVENOUS | Status: AC
  Administered 2018-12-07: 1050 mg via INTRAVENOUS
  Filled 2018-12-07: qty 21

## 2018-12-07 NOTE — Patient Instructions (Signed)
[facility] Discharge Instructions for Patients Receiving Chemotherapy  Today you received the following chemotherapy agents Oxaliplatin (ELOXATIN), Leucovorin & Flourouracil (ADRUCIL).  To help prevent nausea and vomiting after your treatment, we encourage you to take your nausea medication as prescribed.   If you develop nausea and vomiting that is not controlled by your nausea medication, call the clinic.   BELOW ARE SYMPTOMS THAT SHOULD BE REPORTED IMMEDIATELY:  *FEVER GREATER THAN 100.5 F  *CHILLS WITH OR WITHOUT FEVER  NAUSEA AND VOMITING THAT IS NOT CONTROLLED WITH YOUR NAUSEA MEDICATION  *UNUSUAL SHORTNESS OF BREATH  *UNUSUAL BRUISING OR BLEEDING  TENDERNESS IN MOUTH AND THROAT WITH OR WITHOUT PRESENCE OF ULCERS  *URINARY PROBLEMS  *BOWEL PROBLEMS  UNUSUAL RASH Items with * indicate a potential emergency and should be followed up as soon as possible.  Feel free to call the clinic should you have any questions or concerns. The clinic phone number is [phone].  Please show the CHEMO ALERT CARD at check-in to the Emergency Department and triage nurse.   

## 2018-12-07 NOTE — Progress Notes (Addendum)
[place] OFFICE PROGRESS NOTE   Diagnosis: Colon cancer  INTERVAL HISTORY:   Jeremy Stevenson returns as scheduled.  He completed cycle 3 FOLFOX 11/23/2018.  He denies nausea/vomiting.  No mouth sores.  He noted a single lesion at the right corner of the hips.  No diarrhea.  He has mild persistent cold sensitivity.  No numbness or tingling in the absence of cold exposure.  He is having right knee pain.  He states that he "slept wrong", twisting the knee.  He has noted some swelling.  The pain improves when he ambulates.  Objective:  Vital signs in last 24 hours:  Blood pressure (!) 173/96, pulse 72, temperature 97.9 F (36.6 C), temperature source Oral, resp. rate 17, height '6\' 2"'  (1.88 m), weight 298 lb 4.8 oz (135.3 kg), SpO2 99 %.    HEENT: No thrush or ulcers. Resp: Lungs clear bilaterally. Cardio: Regular rate and rhythm. GI: Abdomen soft and nontender.  No hepatomegaly. Vascular: Trace bilateral pretibial edema. Neuro: Vibratory sense intact over the fingertips per tuning fork exam. Musculoskeletal: He appears to have a right knee effusion.  No significant erythema. Skin: Palms without erythema. Port-A-Cath without erythema.   Lab Results:  Lab Results  Component Value Date   WBC 8.8 12/07/2018   HGB 11.8 (L) 12/07/2018   HCT 35.5 (L) 12/07/2018   MCV 88.5 12/07/2018   PLT 108 (L) 12/07/2018   NEUTROABS 6.2 12/07/2018    Imaging:  No results found.  Medications: I have reviewed the patient's current medications.  Assessment/Plan: 1. Adenocarcinoma of the descending colon, stage II (T3 N0), status post a left colectomy 01/20/2017 ? MSI-stable, no loss of mismatch repair protein expression ? Elevated preoperative CEA ? Incomplete preoperative colonoscopy ? Colonoscopy 08/07/2017-multiple polyps removed including tubular adenomas ? CT abdomen/pelvis 02/05/2018-no evidence of metastatic disease. ? Elevated CEA June 2019 ? CTs 06/29/2018-no evidence of  metastatic disease, stable mild bilateral iliac adenopathy ? PET scan 5/91/6384-[id number] hypermetabolic central right liver metastasis;asymmetric hypermetabolism right palatine tonsil with associated mild asymmetric soft tissue fullness on CT images ? MRI liver 08/25/2018-2.6 cm mass in the central right liver consistent with metastasis, no other evidence of metastatic disease ? Ablation and biopsy of right liver lesion 09/29/2018-pathology revealed adenocarcinoma; foundation 1-KRAS G12V, NRAS wildtype, microsatellite stable, tumor mutational burden 1 ? Cycle 1 FOLFOX 10/25/2018 ? Cycle 2 FOLFOX 11/09/2018 ? Cycle 3 FOLFOX 11/23/2018 ? Cycle 4 FOLFOX 12/07/2018  2. Enlargement of the right testicle-hydrocele-Testicular ultrasound 02/26/2017-negative for mass, small bilateral hydroceles  3. Hypertension  4. Depression  5. Family history of breast and ovarian cancer, negative genetic testing  6.History of left leg edema, pain.  7.Pigmented lesion right lower leg-refer to dermatology 06/30/2018  8. Port-A-Cath placement 10/13/2018   Disposition: Jeremy Stevenson appears stable.  He has completed 3 cycles of FOLFOX.  He is tolerating the chemotherapy well.  Plan proceed with cycle 4 today as scheduled.  The plan is for a restaging MRI of the liver after he has completed 6 cycles.  We reviewed the CBC from today.  Counts are adequate for treatment.  He has mild thrombocytopenia.  He understands to contact the office with any bleeding.  He appears to have a right knee effusion.  If the edema/pain persists we will make a referral to orthopedics.  He will return for lab, follow-up and cycle 5 FOLFOX in 2 weeks.  He will contact the office in the interim as outlined above or with any other problems.  Patient seen with Dr. Benay Spice.    [name] ANP/GNP-BC   12/07/2018  12:44 PM  This was a shared visit with [name].  Jeremy Stevenson was interviewed and examined.  He appears to  be tolerating the FOLFOX well.  The plan is to complete 6 cycles prior to a restaging liver MRI.  He has a right knee effusion, likely secondary to a benign musculoskeletal condition.  He will seek medical attention if the effusion persists or progresses.  Julieanne Manson, MD

## 2018-12-08 ENCOUNTER — Telehealth: Payer: Self-pay | Admitting: Oncology

## 2018-12-08 NOTE — Telephone Encounter (Signed)
Scheduled appt per 1/21 los - pt to get an updated schedule next visit.   

## 2018-12-09 ENCOUNTER — Inpatient Hospital Stay: Payer: Medicare Other

## 2018-12-09 VITALS — BP 148/88 | HR 72 | Temp 98.2°F | Resp 17

## 2018-12-09 DIAGNOSIS — C186 Malignant neoplasm of descending colon: Secondary | ICD-10-CM | POA: Diagnosis not present

## 2018-12-09 DIAGNOSIS — C787 Secondary malignant neoplasm of liver and intrahepatic bile duct: Secondary | ICD-10-CM | POA: Diagnosis not present

## 2018-12-09 DIAGNOSIS — Z5111 Encounter for antineoplastic chemotherapy: Secondary | ICD-10-CM | POA: Diagnosis not present

## 2018-12-09 MED ORDER — SODIUM CHLORIDE 0.9% FLUSH
10.0000 mL | INTRAVENOUS | Status: DC | PRN
  Administered 2018-12-09: 10 mL
  Filled 2018-12-09: qty 10

## 2018-12-09 MED ORDER — HEPARIN SOD (PORK) LOCK FLUSH 100 UNIT/ML IV SOLN
500.0000 [IU] | Freq: Once | INTRAVENOUS | Status: AC | PRN
  Administered 2018-12-09: 500 [IU]
  Filled 2018-12-09: qty 5

## 2018-12-13 ENCOUNTER — Encounter (HOSPITAL_COMMUNITY): Payer: Self-pay | Admitting: Oncology

## 2018-12-19 ENCOUNTER — Other Ambulatory Visit: Payer: Self-pay | Admitting: Oncology

## 2018-12-21 ENCOUNTER — Inpatient Hospital Stay: Payer: Medicare Other | Attending: Oncology | Admitting: Oncology

## 2018-12-21 ENCOUNTER — Inpatient Hospital Stay: Payer: Medicare Other

## 2018-12-21 ENCOUNTER — Other Ambulatory Visit: Payer: 59

## 2018-12-21 VITALS — BP 134/85 | HR 67 | Temp 98.3°F | Resp 18 | Ht 74.0 in | Wt 285.2 lb

## 2018-12-21 DIAGNOSIS — I10 Essential (primary) hypertension: Secondary | ICD-10-CM | POA: Insufficient documentation

## 2018-12-21 DIAGNOSIS — D696 Thrombocytopenia, unspecified: Secondary | ICD-10-CM

## 2018-12-21 DIAGNOSIS — Z79899 Other long term (current) drug therapy: Secondary | ICD-10-CM | POA: Insufficient documentation

## 2018-12-21 DIAGNOSIS — C787 Secondary malignant neoplasm of liver and intrahepatic bile duct: Secondary | ICD-10-CM | POA: Diagnosis not present

## 2018-12-21 DIAGNOSIS — Z5111 Encounter for antineoplastic chemotherapy: Secondary | ICD-10-CM | POA: Insufficient documentation

## 2018-12-21 DIAGNOSIS — E876 Hypokalemia: Secondary | ICD-10-CM | POA: Insufficient documentation

## 2018-12-21 DIAGNOSIS — C186 Malignant neoplasm of descending colon: Secondary | ICD-10-CM | POA: Diagnosis not present

## 2018-12-21 DIAGNOSIS — F329 Major depressive disorder, single episode, unspecified: Secondary | ICD-10-CM

## 2018-12-21 DIAGNOSIS — Z95828 Presence of other vascular implants and grafts: Secondary | ICD-10-CM | POA: Insufficient documentation

## 2018-12-21 LAB — CBC WITH DIFFERENTIAL (CANCER CENTER ONLY)
Abs Immature Granulocytes: 0.02 10*3/uL (ref 0.00–0.07)
Basophils Absolute: 0 10*3/uL (ref 0.0–0.1)
Basophils Relative: 1 %
Eosinophils Absolute: 0.2 10*3/uL (ref 0.0–0.5)
Eosinophils Relative: 3 %
HCT: 36.5 % — ABNORMAL LOW (ref 39.0–52.0)
Hemoglobin: 12.2 g/dL — ABNORMAL LOW (ref 13.0–17.0)
Immature Granulocytes: 0 %
Lymphocytes Relative: 18 %
Lymphs Abs: 1.3 10*3/uL (ref 0.7–4.0)
MCH: 30 pg (ref 26.0–34.0)
MCHC: 33.4 g/dL (ref 30.0–36.0)
MCV: 89.9 fL (ref 80.0–100.0)
Monocytes Absolute: 1 10*3/uL (ref 0.1–1.0)
Monocytes Relative: 13 %
Neutro Abs: 4.7 10*3/uL (ref 1.7–7.7)
Neutrophils Relative %: 65 %
Platelet Count: 93 10*3/uL — ABNORMAL LOW (ref 150–400)
RBC: 4.06 MIL/uL — ABNORMAL LOW (ref 4.22–5.81)
RDW: 18.1 % — ABNORMAL HIGH (ref 11.5–15.5)
WBC Count: 7.2 10*3/uL (ref 4.0–10.5)
nRBC: 0 % (ref 0.0–0.2)

## 2018-12-21 LAB — CMP (CANCER CENTER ONLY)
ALT: 47 U/L — ABNORMAL HIGH (ref 0–44)
AST: 41 U/L (ref 15–41)
Albumin: 3.3 g/dL — ABNORMAL LOW (ref 3.5–5.0)
Alkaline Phosphatase: 143 U/L — ABNORMAL HIGH (ref 38–126)
Anion gap: 9 (ref 5–15)
BUN: 16 mg/dL (ref 8–23)
CO2: 29 mmol/L (ref 22–32)
Calcium: 9.5 mg/dL (ref 8.9–10.3)
Chloride: 101 mmol/L (ref 98–111)
Creatinine: 1.1 mg/dL (ref 0.61–1.24)
GFR, Est AFR Am: >60 mL/min (ref >60)
GFR, Estimated: >60 mL/min (ref >60)
Glucose, Bld: 146 mg/dL — ABNORMAL HIGH (ref 70–99)
Potassium: 3.4 mmol/L — ABNORMAL LOW (ref 3.5–5.1)
Sodium: 139 mmol/L (ref 135–145)
Total Bilirubin: 1.1 mg/dL (ref 0.3–1.2)
Total Protein: 6.9 g/dL (ref 6.5–8.1)

## 2018-12-21 LAB — CEA (IN HOUSE-CHCC): CEA (CHCC-In House): 4.91 ng/mL (ref 0.00–5.00)

## 2018-12-21 MED ORDER — LEUCOVORIN CALCIUM INJECTION 350 MG
400.0000 mg/m2 | Freq: Once | INTRAVENOUS | Status: AC
  Administered 2018-12-21: 1040 mg via INTRAVENOUS
  Filled 2018-12-21: qty 50

## 2018-12-21 MED ORDER — DEXTROSE 5 % IV SOLN
Freq: Once | INTRAVENOUS | Status: AC
  Administered 2018-12-21: 11:00:00 via INTRAVENOUS
  Filled 2018-12-21: qty 250

## 2018-12-21 MED ORDER — DEXAMETHASONE SODIUM PHOSPHATE 10 MG/ML IJ SOLN
10.0000 mg | Freq: Once | INTRAMUSCULAR | Status: AC
  Administered 2018-12-21: 10 mg via INTRAVENOUS

## 2018-12-21 MED ORDER — OXALIPLATIN CHEMO INJECTION 100 MG/20ML
70.0000 mg/m2 | Freq: Once | INTRAVENOUS | Status: AC
  Administered 2018-12-21: 180 mg via INTRAVENOUS
  Filled 2018-12-21: qty 36

## 2018-12-21 MED ORDER — DEXAMETHASONE SODIUM PHOSPHATE 10 MG/ML IJ SOLN
INTRAMUSCULAR | Status: AC
  Filled 2018-12-21: qty 1

## 2018-12-21 MED ORDER — SODIUM CHLORIDE 0.9% FLUSH
10.0000 mL | INTRAVENOUS | Status: DC | PRN
  Administered 2018-12-21: 10 mL
  Filled 2018-12-21: qty 10

## 2018-12-21 MED ORDER — PALONOSETRON HCL INJECTION 0.25 MG/5ML
INTRAVENOUS | Status: AC
  Filled 2018-12-21: qty 5

## 2018-12-21 MED ORDER — PALONOSETRON HCL INJECTION 0.25 MG/5ML
0.2500 mg | Freq: Once | INTRAVENOUS | Status: AC
  Administered 2018-12-21: 0.25 mg via INTRAVENOUS

## 2018-12-21 MED ORDER — FLUOROURACIL CHEMO INJECTION 2.5 GM/50ML
400.0000 mg/m2 | Freq: Once | INTRAVENOUS | Status: AC
  Administered 2018-12-21: 1050 mg via INTRAVENOUS
  Filled 2018-12-21: qty 21

## 2018-12-21 MED ORDER — LORAZEPAM 0.5 MG PO TABS: 0.5000 mg | ORAL_TABLET | Freq: Two times a day (BID) | ORAL | 0 refills | Status: DC | PRN

## 2018-12-21 MED ORDER — SODIUM CHLORIDE 0.9 % IV SOLN
2400.0000 mg/m2 | INTRAVENOUS | Status: DC
  Administered 2018-12-21: 6250 mg via INTRAVENOUS
  Filled 2018-12-21: qty 125

## 2018-12-21 NOTE — Patient Instructions (Signed)
[facility] Discharge Instructions for Patients Receiving Chemotherapy  Today you received the following chemotherapy agents Oxaliplatin (ELOXATIN), Leucovorin & Flourouracil (ADRUCIL).  To help prevent nausea and vomiting after your treatment, we encourage you to take your nausea medication as prescribed.   If you develop nausea and vomiting that is not controlled by your nausea medication, call the clinic.   BELOW ARE SYMPTOMS THAT SHOULD BE REPORTED IMMEDIATELY:  *FEVER GREATER THAN 100.5 F  *CHILLS WITH OR WITHOUT FEVER  NAUSEA AND VOMITING THAT IS NOT CONTROLLED WITH YOUR NAUSEA MEDICATION  *UNUSUAL SHORTNESS OF BREATH  *UNUSUAL BRUISING OR BLEEDING  TENDERNESS IN MOUTH AND THROAT WITH OR WITHOUT PRESENCE OF ULCERS  *URINARY PROBLEMS  *BOWEL PROBLEMS  UNUSUAL RASH Items with * indicate a potential emergency and should be followed up as soon as possible.  Feel free to call the clinic should you have any questions or concerns. The clinic phone number is [phone].  Please show the CHEMO ALERT CARD at check-in to the Emergency Department and triage nurse.   

## 2018-12-21 NOTE — Progress Notes (Signed)
Per Dr. Benay Spice: OK to treat with platelet count 93,000. Plans to dose reduce chemo due to weight loss (intentional).

## 2018-12-21 NOTE — Progress Notes (Signed)
[place] OFFICE PROGRESS NOTE   Diagnosis: Colon cancer  INTERVAL HISTORY:   Jeremy Stevenson returns as scheduled.  He completed another cycle of FOLFOX getting 12/07/2018.  Ports cold sensitivity lasting for 10 days following chemotherapy.  No neuropathy symptoms at present.  He had a small ulcer at the angle of the right lips.  No diarrhea. He reports intentional weight loss with a change of his diet.  Objective:  Vital signs in last 24 hours:  Blood pressure (!) 163/100, pulse 70, temperature 98.3 F (36.8 C), temperature source Oral, resp. rate 18, height '6\' 2"'  (1.88 m), weight 285 lb 3.2 oz (129.4 kg), SpO2 98 %.    HEENT: No thrush, small ulcer at the angle of the right lips Resp: Lungs clear bilaterally Cardio: Regular rate and rhythm GI: No hepatosplenomegaly, nontender, no mass Vascular: The left lower leg is slightly larger than the right side with varicosities  Skin: Palms without erythema  Portacath/PICC-without erythema  Lab Results:  Lab Results  Component Value Date   WBC 7.2 12/21/2018   HGB 12.2 (L) 12/21/2018   HCT 36.5 (L) 12/21/2018   MCV 89.9 12/21/2018   PLT 93 (L) 12/21/2018   NEUTROABS 4.7 12/21/2018    CMP  Lab Results  Component Value Date   NA 139 12/21/2018   K 3.4 (L) 12/21/2018   CL 101 12/21/2018   CO2 29 12/21/2018   GLUCOSE 146 (H) 12/21/2018   BUN 16 12/21/2018   CREATININE 1.10 12/21/2018   CALCIUM 9.5 12/21/2018   PROT 6.9 12/21/2018   ALBUMIN 3.3 (L) 12/21/2018   AST 41 12/21/2018   ALT 47 (H) 12/21/2018   ALKPHOS 143 (H) 12/21/2018   BILITOT 1.1 12/21/2018   GFRNONAA >60 12/21/2018   GFRAA >60 12/21/2018    Lab Results  Component Value Date   CEA1 5.52 (H) 11/23/2018     Medications: I have reviewed the patient's current medications.   Assessment/Plan: 1. Adenocarcinoma of the descending colon, stage II (T3 N0), status post a left colectomy 01/20/2017 ? MSI-stable, no loss of mismatch repair  protein expression ? Elevated preoperative CEA ? Incomplete preoperative colonoscopy ? Colonoscopy 08/07/2017-multiple polyps removed including tubular adenomas ? CT abdomen/pelvis 02/05/2018-no evidence of metastatic disease. ? Elevated CEA June 2019 ? CTs 06/29/2018-no evidence of metastatic disease, stable mild bilateral iliac adenopathy ? PET scan 6/81/2751-[id number] hypermetabolic central right liver metastasis;asymmetric hypermetabolism right palatine tonsil with associated mild asymmetric soft tissue fullness on CT images ? MRI liver 08/25/2018-2.6 cm mass in the central right liver consistent with metastasis, no other evidence of metastatic disease ? Ablation and biopsy of right liver lesion 09/29/2018-pathology revealed adenocarcinoma; foundation 1-KRAS G12V, NRAS wildtype, microsatellite stable, tumor mutational burden 1 ? Cycle 1 FOLFOX 10/25/2018 ? Cycle 2 FOLFOX 11/09/2018 ? Cycle 3 FOLFOX 11/23/2018 ? Cycle 4 FOLFOX 12/07/2018 ? Cycle 5 FOLFOX 12/21/2018 (oxaliplatin dose reduced secondary to thrombocytopenia)  2. Enlargement of the right testicle-hydrocele-Testicular ultrasound 02/26/2017-negative for mass, small bilateral hydroceles  3. Hypertension  4. Depression  5. Family history of breast and ovarian cancer, negative genetic testing  6.History of left leg edema, pain.  7.Pigmented lesion right lower leg-refer to dermatology 06/30/2018  8. Port-A-Cath placement 10/13/2018    Disposition: Jeremy Stevenson is tolerating the chemotherapy well.  We adjusted the chemotherapy doses for his current weight.  I dose reduced the oxaliplatin secondary to mild thrombocytopenia.  He will complete cycle 5 FOLFOX today.  Jeremy Stevenson will return for an office  visit and chemotherapy in 2 weeks.  Betsy Coder, MD  12/21/2018  10:21 AM

## 2018-12-21 NOTE — Addendum Note (Signed)
Addended by: Tania Ade on: 12/21/2018 11:12 AM   Modules accepted: Orders

## 2018-12-23 ENCOUNTER — Inpatient Hospital Stay: Payer: Medicare Other

## 2018-12-23 VITALS — BP 173/96 | HR 64 | Temp 98.6°F | Resp 18

## 2018-12-23 DIAGNOSIS — Z5111 Encounter for antineoplastic chemotherapy: Secondary | ICD-10-CM | POA: Diagnosis not present

## 2018-12-23 DIAGNOSIS — C787 Secondary malignant neoplasm of liver and intrahepatic bile duct: Secondary | ICD-10-CM | POA: Diagnosis not present

## 2018-12-23 DIAGNOSIS — F329 Major depressive disorder, single episode, unspecified: Secondary | ICD-10-CM | POA: Diagnosis not present

## 2018-12-23 DIAGNOSIS — C186 Malignant neoplasm of descending colon: Secondary | ICD-10-CM | POA: Diagnosis not present

## 2018-12-23 DIAGNOSIS — D696 Thrombocytopenia, unspecified: Secondary | ICD-10-CM | POA: Diagnosis not present

## 2018-12-23 DIAGNOSIS — E876 Hypokalemia: Secondary | ICD-10-CM | POA: Diagnosis not present

## 2018-12-23 MED ORDER — SODIUM CHLORIDE 0.9% FLUSH
10.0000 mL | INTRAVENOUS | Status: DC | PRN
  Administered 2018-12-23: 10 mL
  Filled 2018-12-23: qty 10

## 2018-12-23 MED ORDER — HEPARIN SOD (PORK) LOCK FLUSH 100 UNIT/ML IV SOLN
500.0000 [IU] | Freq: Once | INTRAVENOUS | Status: AC | PRN
  Administered 2018-12-23: 500 [IU]
  Filled 2018-12-23: qty 5

## 2019-01-02 ENCOUNTER — Other Ambulatory Visit: Payer: Self-pay | Admitting: Oncology

## 2019-01-04 ENCOUNTER — Telehealth: Payer: Self-pay | Admitting: Nurse Practitioner

## 2019-01-04 ENCOUNTER — Encounter: Payer: Self-pay | Admitting: Nurse Practitioner

## 2019-01-04 ENCOUNTER — Other Ambulatory Visit: Payer: Self-pay | Admitting: Medical

## 2019-01-04 ENCOUNTER — Inpatient Hospital Stay: Payer: Medicare Other

## 2019-01-04 ENCOUNTER — Inpatient Hospital Stay (HOSPITAL_BASED_OUTPATIENT_CLINIC_OR_DEPARTMENT_OTHER): Payer: Medicare Other | Admitting: Nurse Practitioner

## 2019-01-04 ENCOUNTER — Inpatient Hospital Stay (HOSPITAL_BASED_OUTPATIENT_CLINIC_OR_DEPARTMENT_OTHER): Payer: Medicare Other | Admitting: Medical

## 2019-01-04 VITALS — BP 140/84 | HR 65 | Temp 98.2°F | Resp 20 | Ht 74.0 in | Wt 285.0 lb

## 2019-01-04 DIAGNOSIS — C787 Secondary malignant neoplasm of liver and intrahepatic bile duct: Secondary | ICD-10-CM | POA: Diagnosis not present

## 2019-01-04 DIAGNOSIS — F329 Major depressive disorder, single episode, unspecified: Secondary | ICD-10-CM | POA: Diagnosis not present

## 2019-01-04 DIAGNOSIS — I10 Essential (primary) hypertension: Secondary | ICD-10-CM | POA: Diagnosis not present

## 2019-01-04 DIAGNOSIS — Z5111 Encounter for antineoplastic chemotherapy: Secondary | ICD-10-CM | POA: Diagnosis not present

## 2019-01-04 DIAGNOSIS — C186 Malignant neoplasm of descending colon: Secondary | ICD-10-CM

## 2019-01-04 DIAGNOSIS — Z79899 Other long term (current) drug therapy: Secondary | ICD-10-CM

## 2019-01-04 DIAGNOSIS — E876 Hypokalemia: Secondary | ICD-10-CM

## 2019-01-04 DIAGNOSIS — D696 Thrombocytopenia, unspecified: Secondary | ICD-10-CM | POA: Diagnosis not present

## 2019-01-04 DIAGNOSIS — R197 Diarrhea, unspecified: Secondary | ICD-10-CM

## 2019-01-04 DIAGNOSIS — Z95828 Presence of other vascular implants and grafts: Secondary | ICD-10-CM

## 2019-01-04 LAB — CBC WITH DIFFERENTIAL (CANCER CENTER ONLY)
Abs Immature Granulocytes: 0.03 10*3/uL (ref 0.00–0.07)
Basophils Absolute: 0 10*3/uL (ref 0.0–0.1)
Basophils Relative: 1 %
Eosinophils Absolute: 0.2 10*3/uL (ref 0.0–0.5)
Eosinophils Relative: 3 %
HCT: 37.3 % — ABNORMAL LOW (ref 39.0–52.0)
Hemoglobin: 12.5 g/dL — ABNORMAL LOW (ref 13.0–17.0)
Immature Granulocytes: 1 %
Lymphocytes Relative: 21 %
Lymphs Abs: 1.4 10*3/uL (ref 0.7–4.0)
MCH: 30.8 pg (ref 26.0–34.0)
MCHC: 33.5 g/dL (ref 30.0–36.0)
MCV: 91.9 fL (ref 80.0–100.0)
Monocytes Absolute: 0.8 10*3/uL (ref 0.1–1.0)
Monocytes Relative: 12 %
Neutro Abs: 4.2 10*3/uL (ref 1.7–7.7)
Neutrophils Relative %: 62 %
Platelet Count: 107 10*3/uL — ABNORMAL LOW (ref 150–400)
RBC: 4.06 MIL/uL — ABNORMAL LOW (ref 4.22–5.81)
RDW: 18.5 % — ABNORMAL HIGH (ref 11.5–15.5)
WBC Count: 6.6 10*3/uL (ref 4.0–10.5)
nRBC: 0 % (ref 0.0–0.2)

## 2019-01-04 LAB — CMP (CANCER CENTER ONLY)
ALT: 43 U/L (ref 0–44)
AST: 36 U/L (ref 15–41)
Albumin: 3.3 g/dL — ABNORMAL LOW (ref 3.5–5.0)
Alkaline Phosphatase: 153 U/L — ABNORMAL HIGH (ref 38–126)
Anion gap: 12 (ref 5–15)
BUN: 15 mg/dL (ref 8–23)
CO2: 27 mmol/L (ref 22–32)
Calcium: 9.5 mg/dL (ref 8.9–10.3)
Chloride: 98 mmol/L (ref 98–111)
Creatinine: 1.08 mg/dL (ref 0.61–1.24)
GFR, Est AFR Am: >60 mL/min (ref >60)
GFR, Estimated: >60 mL/min (ref >60)
Glucose, Bld: 153 mg/dL — ABNORMAL HIGH (ref 70–99)
Potassium: 3.1 mmol/L — ABNORMAL LOW (ref 3.5–5.1)
Sodium: 137 mmol/L (ref 135–145)
Total Bilirubin: 0.9 mg/dL (ref 0.3–1.2)
Total Protein: 7 g/dL (ref 6.5–8.1)

## 2019-01-04 MED ORDER — PALONOSETRON HCL INJECTION 0.25 MG/5ML
INTRAVENOUS | Status: AC
  Filled 2019-01-04: qty 5

## 2019-01-04 MED ORDER — OXALIPLATIN CHEMO INJECTION 100 MG/20ML
70.0000 mg/m2 | Freq: Once | INTRAVENOUS | Status: AC
  Administered 2019-01-04: 180 mg via INTRAVENOUS
  Filled 2019-01-04: qty 36

## 2019-01-04 MED ORDER — LEUCOVORIN CALCIUM INJECTION 350 MG
400.0000 mg/m2 | Freq: Once | INTRAVENOUS | Status: AC
  Administered 2019-01-04: 1040 mg via INTRAVENOUS
  Filled 2019-01-04: qty 25

## 2019-01-04 MED ORDER — DIPHENOXYLATE-ATROPINE 2.5-0.025 MG PO TABS: 1.0000 | ORAL_TABLET | Freq: Once | ORAL | Status: DC

## 2019-01-04 MED ORDER — DIPHENOXYLATE-ATROPINE 2.5-0.025 MG PO TABS
1.0000 | ORAL_TABLET | Freq: Once | ORAL | Status: AC
  Administered 2019-01-04: 1 via ORAL

## 2019-01-04 MED ORDER — SODIUM CHLORIDE 0.9 % IV SOLN
2400.0000 mg/m2 | INTRAVENOUS | Status: DC
  Administered 2019-01-04: 6250 mg via INTRAVENOUS
  Filled 2019-01-04: qty 125

## 2019-01-04 MED ORDER — PALONOSETRON HCL INJECTION 0.25 MG/5ML
0.2500 mg | Freq: Once | INTRAVENOUS | Status: AC
  Administered 2019-01-04: 0.25 mg via INTRAVENOUS

## 2019-01-04 MED ORDER — DEXAMETHASONE SODIUM PHOSPHATE 10 MG/ML IJ SOLN
INTRAMUSCULAR | Status: AC
  Filled 2019-01-04: qty 1

## 2019-01-04 MED ORDER — DEXTROSE 5 % IV SOLN
Freq: Once | INTRAVENOUS | Status: AC
  Administered 2019-01-04: 11:00:00 via INTRAVENOUS
  Filled 2019-01-04: qty 250

## 2019-01-04 MED ORDER — POTASSIUM CHLORIDE CRYS ER 20 MEQ PO TBCR: EXTENDED_RELEASE_TABLET | ORAL | 1 refills | Status: DC

## 2019-01-04 MED ORDER — DIPHENOXYLATE-ATROPINE 2.5-0.025 MG PO TABS: ORAL_TABLET | ORAL | 2 refills | Status: DC

## 2019-01-04 MED ORDER — DEXAMETHASONE SODIUM PHOSPHATE 10 MG/ML IJ SOLN
10.0000 mg | Freq: Once | INTRAMUSCULAR | Status: AC
  Administered 2019-01-04: 10 mg via INTRAVENOUS

## 2019-01-04 MED ORDER — FLUOROURACIL CHEMO INJECTION 2.5 GM/50ML
400.0000 mg/m2 | Freq: Once | INTRAVENOUS | Status: AC
  Administered 2019-01-04: 1050 mg via INTRAVENOUS
  Filled 2019-01-04: qty 21

## 2019-01-04 MED ORDER — SODIUM CHLORIDE 0.9% FLUSH
10.0000 mL | INTRAVENOUS | Status: DC | PRN
  Administered 2019-01-04: 10 mL
  Filled 2019-01-04: qty 10

## 2019-01-04 MED ORDER — SODIUM CHLORIDE 0.9% FLUSH
10.0000 mL | INTRAVENOUS | Status: DC | PRN
  Filled 2019-01-04: qty 10

## 2019-01-04 NOTE — Patient Instructions (Signed)
[facility] Discharge Instructions for Patients Receiving Chemotherapy  Today you received the following chemotherapy agents Oxaliplatin (ELOXATIN), Leucovorin & Flourouracil (ADRUCIL).  To help prevent nausea and vomiting after your treatment, we encourage you to take your nausea medication as prescribed.   If you develop nausea and vomiting that is not controlled by your nausea medication, call the clinic.   BELOW ARE SYMPTOMS THAT SHOULD BE REPORTED IMMEDIATELY:  *FEVER GREATER THAN 100.5 F  *CHILLS WITH OR WITHOUT FEVER  NAUSEA AND VOMITING THAT IS NOT CONTROLLED WITH YOUR NAUSEA MEDICATION  *UNUSUAL SHORTNESS OF BREATH  *UNUSUAL BRUISING OR BLEEDING  TENDERNESS IN MOUTH AND THROAT WITH OR WITHOUT PRESENCE OF ULCERS  *URINARY PROBLEMS  *BOWEL PROBLEMS  UNUSUAL RASH Items with * indicate a potential emergency and should be followed up as soon as possible.  Feel free to call the clinic should you have any questions or concerns. The clinic phone number is [phone].  Please show the CHEMO ALERT CARD at check-in to the Emergency Department and triage nurse.   

## 2019-01-04 NOTE — Progress Notes (Signed)
  [place] OFFICE PROGRESS NOTE   Diagnosis: Colon cancer  INTERVAL HISTORY:   Mr. Martinique returns as scheduled.  He completed another cycle of FOLFOX 12/21/2018.  He denies nausea/vomiting.  Left lateral tongue was sore for a few days, better now.  He had a few loose stools earlier this week.  Cold sensitivity lasted about 10 days.  No persistent neuropathy symptoms.  Objective:  Vital signs in last 24 hours:  Blood pressure 140/84, pulse 65, temperature 98.2 F (36.8 C), temperature source Oral, resp. rate 20, height _0  (1.88 m), weight 285 lb (129.3 kg), SpO2 100 %.    HEENT: No thrush or ulcers. Resp: Lungs clear bilaterally. Cardio: Regular rate and rhythm. GI: Abdomen soft and nontender.  No hepatomegaly. Vascular: No leg edema. Neuro: Vibratory sense intact over the fingertips per tuning fork exam. Skin: Palms without erythema. Port-A-Cath without erythema.   Lab Results:  Lab Results  Component Value Date   WBC 6.6 01/04/2019   HGB 12.5 (L) 01/04/2019   HCT 37.3 (L) 01/04/2019   MCV 91.9 01/04/2019   PLT 107 (L) 01/04/2019   NEUTROABS 4.2 01/04/2019    Imaging:  No results found.  Medications: I have reviewed the patient's current medications.  Assessment/Plan: 1. Adenocarcinoma of the descending colon, stage II (T3 N0), status post a left colectomy 01/20/2017 ? MSI-stable, no loss of mismatch repair protein expression ? Elevated preoperative CEA ? Incomplete preoperative colonoscopy ? Colonoscopy 08/07/2017-multiple polyps removed including tubular adenomas ? CT abdomen/pelvis 02/05/2018-no evidence of metastatic disease. ? Elevated CEA June 2019 ? CTs 06/29/2018-no evidence of metastatic disease, stable mild bilateral iliac adenopathy ? PET scan 03/18/7740-OINOMVEH hypermetabolic central right liver metastasis;asymmetric hypermetabolism right palatine tonsil with associated mild asymmetric soft tissue fullness on CT images ? MRI liver  08/25/2018-2.6 cm mass in the central right liver consistent with metastasis, no other evidence of metastatic disease ? Ablation and biopsy of right liver lesion 09/29/2018-pathology revealed adenocarcinoma; foundation 1-KRAS G12V, NRAS wildtype, microsatellite stable, tumor mutational burden 1 ? Cycle 1 FOLFOX 10/25/2018 ? Cycle 2 FOLFOX 11/09/2018 ? Cycle 3 FOLFOX 11/23/2018 ? Cycle 4 FOLFOX 12/07/2018 ? Cycle 5 FOLFOX 12/21/2018 (oxaliplatin dose reduced secondary to thrombocytopenia) ? Cycle 6 FOLFOX 01/04/2019  2. Enlargement of the right testicle-hydrocele-Testicular ultrasound 02/26/2017-negative for mass, small bilateral hydroceles  3. Hypertension  4. Depression  5. Family history of breast and ovarian cancer, negative genetic testing  6.History of left leg edema, pain.  7.Pigmented lesion right lower leg-refer to dermatology 06/30/2018  8. Port-A-Cath placement 10/13/2018   Disposition: Mr. Martinique appears stable.  He has completed 5 cycles of FOLFOX.  Plan to proceed with cycle 6 today as scheduled.  We reviewed the CBC from today.  He has mild thrombocytopenia, improved as compared to 2 weeks ago.  Counts are adequate to proceed with treatment.  He has hypokalemia.  He will take K-Dur 20 milliequivalents twice a day for the next 3 days, then daily.  He will return for lab, follow-up and cycle 7 FOLFOX in 2 weeks.  He will contact the office in the interim with any problems.    [name] ANP/GNP-BC   01/04/2019  10:57 AM

## 2019-01-04 NOTE — Telephone Encounter (Signed)
Scheduled appt per 02/18 los. ° °Printed calendar and avs. °

## 2019-01-05 LAB — CEA (IN HOUSE-CHCC): CEA (CHCC-In House): 4.23 ng/mL (ref 0.00–5.00)

## 2019-01-06 ENCOUNTER — Inpatient Hospital Stay: Payer: Medicare Other

## 2019-01-06 VITALS — BP 159/91 | HR 70 | Temp 98.4°F | Resp 20

## 2019-01-06 DIAGNOSIS — Z5111 Encounter for antineoplastic chemotherapy: Secondary | ICD-10-CM | POA: Diagnosis not present

## 2019-01-06 DIAGNOSIS — C186 Malignant neoplasm of descending colon: Secondary | ICD-10-CM

## 2019-01-06 DIAGNOSIS — C787 Secondary malignant neoplasm of liver and intrahepatic bile duct: Secondary | ICD-10-CM | POA: Diagnosis not present

## 2019-01-06 DIAGNOSIS — D696 Thrombocytopenia, unspecified: Secondary | ICD-10-CM | POA: Diagnosis not present

## 2019-01-06 DIAGNOSIS — E876 Hypokalemia: Secondary | ICD-10-CM | POA: Diagnosis not present

## 2019-01-06 DIAGNOSIS — F329 Major depressive disorder, single episode, unspecified: Secondary | ICD-10-CM | POA: Diagnosis not present

## 2019-01-06 MED ORDER — HEPARIN SOD (PORK) LOCK FLUSH 100 UNIT/ML IV SOLN
500.0000 [IU] | Freq: Once | INTRAVENOUS | Status: AC | PRN
  Administered 2019-01-06: 500 [IU]
  Filled 2019-01-06: qty 5

## 2019-01-06 MED ORDER — SODIUM CHLORIDE 0.9% FLUSH
10.0000 mL | INTRAVENOUS | Status: DC | PRN
  Administered 2019-01-06: 10 mL
  Filled 2019-01-06: qty 10

## 2019-01-07 NOTE — Progress Notes (Signed)
The patient was seen in the infusion room today as he was receiving chemotherapy.  He reports that he has been having some diarrhea.  He has been up to the bathroom with diarrhea during his infusion today.  He was given Lomotil x1 while he was here today with a prescription sent to his pharmacy for the same.  Sandi Mealy, MHS, PA-C Physician Assistant

## 2019-01-11 DIAGNOSIS — L821 Other seborrheic keratosis: Secondary | ICD-10-CM | POA: Diagnosis not present

## 2019-01-11 DIAGNOSIS — L570 Actinic keratosis: Secondary | ICD-10-CM | POA: Diagnosis not present

## 2019-01-11 DIAGNOSIS — D1809 Hemangioma of other sites: Secondary | ICD-10-CM | POA: Diagnosis not present

## 2019-01-11 DIAGNOSIS — Z23 Encounter for immunization: Secondary | ICD-10-CM | POA: Diagnosis not present

## 2019-01-11 DIAGNOSIS — D485 Neoplasm of uncertain behavior of skin: Secondary | ICD-10-CM | POA: Diagnosis not present

## 2019-01-15 ENCOUNTER — Other Ambulatory Visit: Payer: Self-pay | Admitting: Oncology

## 2019-01-18 ENCOUNTER — Telehealth: Payer: Self-pay | Admitting: Oncology

## 2019-01-18 ENCOUNTER — Inpatient Hospital Stay: Payer: Medicare Other

## 2019-01-18 ENCOUNTER — Other Ambulatory Visit: Payer: Self-pay

## 2019-01-18 ENCOUNTER — Inpatient Hospital Stay (HOSPITAL_BASED_OUTPATIENT_CLINIC_OR_DEPARTMENT_OTHER): Payer: Medicare Other | Admitting: Oncology

## 2019-01-18 ENCOUNTER — Inpatient Hospital Stay: Payer: Medicare Other | Attending: Oncology

## 2019-01-18 VITALS — BP 132/80 | HR 73 | Temp 98.7°F | Resp 20 | Ht 74.0 in | Wt 280.1 lb

## 2019-01-18 DIAGNOSIS — Z5111 Encounter for antineoplastic chemotherapy: Secondary | ICD-10-CM | POA: Insufficient documentation

## 2019-01-18 DIAGNOSIS — Z803 Family history of malignant neoplasm of breast: Secondary | ICD-10-CM | POA: Diagnosis not present

## 2019-01-18 DIAGNOSIS — I10 Essential (primary) hypertension: Secondary | ICD-10-CM

## 2019-01-18 DIAGNOSIS — Z8041 Family history of malignant neoplasm of ovary: Secondary | ICD-10-CM | POA: Diagnosis not present

## 2019-01-18 DIAGNOSIS — C787 Secondary malignant neoplasm of liver and intrahepatic bile duct: Secondary | ICD-10-CM | POA: Insufficient documentation

## 2019-01-18 DIAGNOSIS — C186 Malignant neoplasm of descending colon: Secondary | ICD-10-CM

## 2019-01-18 DIAGNOSIS — Z95828 Presence of other vascular implants and grafts: Secondary | ICD-10-CM

## 2019-01-18 LAB — CBC WITH DIFFERENTIAL (CANCER CENTER ONLY)
Abs Immature Granulocytes: 0.04 10*3/uL (ref 0.00–0.07)
Basophils Absolute: 0.1 10*3/uL (ref 0.0–0.1)
Basophils Relative: 1 %
Eosinophils Absolute: 0.1 10*3/uL (ref 0.0–0.5)
Eosinophils Relative: 2 %
HCT: 36.5 % — ABNORMAL LOW (ref 39.0–52.0)
Hemoglobin: 12.3 g/dL — ABNORMAL LOW (ref 13.0–17.0)
Immature Granulocytes: 1 %
Lymphocytes Relative: 20 %
Lymphs Abs: 1.3 10*3/uL (ref 0.7–4.0)
MCH: 31.8 pg (ref 26.0–34.0)
MCHC: 33.7 g/dL (ref 30.0–36.0)
MCV: 94.3 fL (ref 80.0–100.0)
Monocytes Absolute: 0.9 10*3/uL (ref 0.1–1.0)
Monocytes Relative: 14 %
Neutro Abs: 4.2 10*3/uL (ref 1.7–7.7)
Neutrophils Relative %: 62 %
Platelet Count: 115 10*3/uL — ABNORMAL LOW (ref 150–400)
RBC: 3.87 MIL/uL — ABNORMAL LOW (ref 4.22–5.81)
RDW: 18.9 % — ABNORMAL HIGH (ref 11.5–15.5)
WBC Count: 6.7 10*3/uL (ref 4.0–10.5)
nRBC: 0 % (ref 0.0–0.2)

## 2019-01-18 LAB — CMP (CANCER CENTER ONLY)
ALT: 42 U/L (ref 0–44)
AST: 42 U/L — ABNORMAL HIGH (ref 15–41)
Albumin: 3.2 g/dL — ABNORMAL LOW (ref 3.5–5.0)
Alkaline Phosphatase: 152 U/L — ABNORMAL HIGH (ref 38–126)
Anion gap: 12 (ref 5–15)
BUN: 17 mg/dL (ref 8–23)
CO2: 26 mmol/L (ref 22–32)
Calcium: 9.4 mg/dL (ref 8.9–10.3)
Chloride: 99 mmol/L (ref 98–111)
Creatinine: 1.14 mg/dL (ref 0.61–1.24)
GFR, Est AFR Am: >60 mL/min (ref >60)
GFR, Estimated: >60 mL/min (ref >60)
Glucose, Bld: 131 mg/dL — ABNORMAL HIGH (ref 70–99)
Potassium: 3.4 mmol/L — ABNORMAL LOW (ref 3.5–5.1)
Sodium: 137 mmol/L (ref 135–145)
Total Bilirubin: 0.9 mg/dL (ref 0.3–1.2)
Total Protein: 7.1 g/dL (ref 6.5–8.1)

## 2019-01-18 MED ORDER — DEXTROSE 5 % IV SOLN
Freq: Once | INTRAVENOUS | Status: AC
  Administered 2019-01-18: 12:00:00 via INTRAVENOUS
  Filled 2019-01-18: qty 250

## 2019-01-18 MED ORDER — DEXAMETHASONE SODIUM PHOSPHATE 10 MG/ML IJ SOLN
10.0000 mg | Freq: Once | INTRAMUSCULAR | Status: AC
  Administered 2019-01-18: 10 mg via INTRAVENOUS

## 2019-01-18 MED ORDER — SODIUM CHLORIDE 0.9 % IV SOLN
2400.0000 mg/m2 | INTRAVENOUS | Status: DC
  Administered 2019-01-18: 6250 mg via INTRAVENOUS
  Filled 2019-01-18: qty 125

## 2019-01-18 MED ORDER — OXALIPLATIN CHEMO INJECTION 100 MG/20ML
70.0000 mg/m2 | Freq: Once | INTRAVENOUS | Status: AC
  Administered 2019-01-18: 180 mg via INTRAVENOUS
  Filled 2019-01-18: qty 36

## 2019-01-18 MED ORDER — DEXAMETHASONE SODIUM PHOSPHATE 10 MG/ML IJ SOLN
INTRAMUSCULAR | Status: AC
  Filled 2019-01-18: qty 1

## 2019-01-18 MED ORDER — PALONOSETRON HCL INJECTION 0.25 MG/5ML
INTRAVENOUS | Status: AC
  Filled 2019-01-18: qty 5

## 2019-01-18 MED ORDER — PALONOSETRON HCL INJECTION 0.25 MG/5ML
0.2500 mg | Freq: Once | INTRAVENOUS | Status: AC
  Administered 2019-01-18: 0.25 mg via INTRAVENOUS

## 2019-01-18 MED ORDER — SODIUM CHLORIDE 0.9% FLUSH
10.0000 mL | INTRAVENOUS | Status: DC | PRN
  Administered 2019-01-18: 10 mL
  Filled 2019-01-18: qty 10

## 2019-01-18 MED ORDER — FLUOROURACIL CHEMO INJECTION 2.5 GM/50ML
400.0000 mg/m2 | Freq: Once | INTRAVENOUS | Status: AC
  Administered 2019-01-18: 1050 mg via INTRAVENOUS
  Filled 2019-01-18: qty 21

## 2019-01-18 MED ORDER — LEUCOVORIN CALCIUM INJECTION 350 MG
400.0000 mg/m2 | Freq: Once | INTRAVENOUS | Status: AC
  Administered 2019-01-18: 1040 mg via INTRAVENOUS
  Filled 2019-01-18: qty 52

## 2019-01-18 NOTE — Progress Notes (Signed)
[place] OFFICE PROGRESS NOTE   Diagnosis: Colon cancer  INTERVAL HISTORY:   Jeremy Stevenson returns as scheduled.  He completed another cycle of FOLFOX 01/04/2019.  He reports having a few episodes of diarrhea, but no consistent diarrhea.  Cold sensitivity lasted approximately 10 days following chemotherapy.  No cold sensitivity at present.  No other neuropathy symptoms.  No mouth sores.  No nausea or vomiting.  Objective:  Vital signs in last 24 hours:  Blood pressure 132/80, pulse 73, temperature 98.7 F (37.1 C), temperature source Oral, resp. rate 20, height 6' 2" (1.88 m), weight 280 lb 1.6 oz (127.1 kg), SpO2 98 %.    HEENT: No thrush or ulcers Resp: Lungs clear bilaterally Cardio: Regular rate and rhythm GI: No hepatosplenomegaly, no mass, nontender Vascular: No leg edema Neuro: Mild loss of vibratory sense of the fingertips bilaterally   Portacath/PICC-without erythema  Lab Results:  Lab Results  Component Value Date   WBC 6.7 01/18/2019   HGB 12.3 (L) 01/18/2019   HCT 36.5 (L) 01/18/2019   MCV 94.3 01/18/2019   PLT 115 (L) 01/18/2019   NEUTROABS 4.2 01/18/2019    CMP  Lab Results  Component Value Date   NA 137 01/18/2019   K 3.4 (L) 01/18/2019   CL 99 01/18/2019   CO2 26 01/18/2019   GLUCOSE 131 (H) 01/18/2019   BUN 17 01/18/2019   CREATININE 1.14 01/18/2019   CALCIUM 9.4 01/18/2019   PROT 7.1 01/18/2019   ALBUMIN 3.2 (L) 01/18/2019   AST 42 (H) 01/18/2019   ALT 42 01/18/2019   ALKPHOS 152 (H) 01/18/2019   BILITOT 0.9 01/18/2019   GFRNONAA >60 01/18/2019   GFRAA >60 01/18/2019    Lab Results  Component Value Date   CEA1 4.23 01/04/2019    Medications: I have reviewed the patient's current medications.   Assessment/Plan: 1. Adenocarcinoma of the descending colon, stage II (T3 N0), status post a left colectomy 01/20/2017 ? MSI-stable, no loss of mismatch repair protein expression ? Elevated preoperative CEA ? Incomplete  preoperative colonoscopy ? Colonoscopy 08/07/2017-multiple polyps removed including tubular adenomas ? CT abdomen/pelvis 02/05/2018-no evidence of metastatic disease. ? Elevated CEA June 2019 ? CTs 06/29/2018-no evidence of metastatic disease, stable mild bilateral iliac adenopathy ? PET scan 03/25/3266-TIWPYKDX hypermetabolic central right liver metastasis;asymmetric hypermetabolism right palatine tonsil with associated mild asymmetric soft tissue fullness on CT images ? MRI liver 08/25/2018-2.6 cm mass in the central right liver consistent with metastasis, no other evidence of metastatic disease ? Ablation and biopsy of right liver lesion 09/29/2018-pathology revealed adenocarcinoma; foundation 1-KRAS G12V, NRAS wildtype, microsatellite stable, tumor mutational burden 1 ? Cycle 1 FOLFOX 10/25/2018 ? Cycle 2 FOLFOX 11/09/2018 ? Cycle 3 FOLFOX 11/23/2018 ? Cycle 4 FOLFOX 12/07/2018 ? Cycle 5 FOLFOX 12/21/2018 (oxaliplatin dose reduced secondary to thrombocytopenia) ? Cycle 6 FOLFOX 01/04/2019 ? Cycle 7 FOLFOX 01/18/2019  2. Enlargement of the right testicle-hydrocele-Testicular ultrasound 02/26/2017-negative for mass, small bilateral hydroceles  3. Hypertension  4. Depression  5. Family history of breast and ovarian cancer, negative genetic testing  6.History of left leg edema, pain.  7.Pigmented lesion right lower leg-refer to dermatology 06/30/2018  8. Port-A-Cath placement 10/13/2018     Disposition: Jeremy Stevenson has completed 6 cycles of adjuvant chemotherapy.  He has tolerated the chemotherapy well.  The plan is to proceed with cycle 7 today.  He will return for an office visit and chemotherapy in 2 weeks.  15 minutes were spent with the patient today.  The majority of the time was used for counseling and coordination of care.  Betsy Coder, MD  01/18/2019  12:10 PM

## 2019-01-18 NOTE — Telephone Encounter (Signed)
Scheduled per los, declined printout  ° °

## 2019-01-20 ENCOUNTER — Inpatient Hospital Stay: Payer: Medicare Other

## 2019-01-20 VITALS — HR 76 | Resp 18

## 2019-01-20 DIAGNOSIS — Z95828 Presence of other vascular implants and grafts: Secondary | ICD-10-CM

## 2019-01-20 DIAGNOSIS — C186 Malignant neoplasm of descending colon: Secondary | ICD-10-CM | POA: Diagnosis not present

## 2019-01-20 DIAGNOSIS — C787 Secondary malignant neoplasm of liver and intrahepatic bile duct: Secondary | ICD-10-CM | POA: Diagnosis not present

## 2019-01-20 DIAGNOSIS — Z5111 Encounter for antineoplastic chemotherapy: Secondary | ICD-10-CM | POA: Diagnosis not present

## 2019-01-20 MED ORDER — SODIUM CHLORIDE 0.9% FLUSH
10.0000 mL | INTRAVENOUS | Status: DC | PRN
  Administered 2019-01-20: 10 mL
  Filled 2019-01-20: qty 10

## 2019-01-20 MED ORDER — HEPARIN SOD (PORK) LOCK FLUSH 100 UNIT/ML IV SOLN
500.0000 [IU] | Freq: Once | INTRAVENOUS | Status: AC | PRN
  Administered 2019-01-20: 500 [IU]
  Filled 2019-01-20: qty 5

## 2019-01-30 ENCOUNTER — Other Ambulatory Visit: Payer: Self-pay | Admitting: Oncology

## 2019-02-01 ENCOUNTER — Inpatient Hospital Stay: Payer: Medicare Other

## 2019-02-01 ENCOUNTER — Inpatient Hospital Stay (HOSPITAL_BASED_OUTPATIENT_CLINIC_OR_DEPARTMENT_OTHER): Payer: Medicare Other | Admitting: Oncology

## 2019-02-01 ENCOUNTER — Other Ambulatory Visit: Payer: Self-pay

## 2019-02-01 ENCOUNTER — Telehealth: Payer: Self-pay | Admitting: Oncology

## 2019-02-01 VITALS — BP 140/80 | HR 74 | Temp 99.2°F | Resp 17 | Ht 74.0 in | Wt 281.7 lb

## 2019-02-01 DIAGNOSIS — N433 Hydrocele, unspecified: Secondary | ICD-10-CM

## 2019-02-01 DIAGNOSIS — D6959 Other secondary thrombocytopenia: Secondary | ICD-10-CM

## 2019-02-01 DIAGNOSIS — G620 Drug-induced polyneuropathy: Secondary | ICD-10-CM

## 2019-02-01 DIAGNOSIS — Z5111 Encounter for antineoplastic chemotherapy: Secondary | ICD-10-CM | POA: Diagnosis not present

## 2019-02-01 DIAGNOSIS — Z79899 Other long term (current) drug therapy: Secondary | ICD-10-CM

## 2019-02-01 DIAGNOSIS — C787 Secondary malignant neoplasm of liver and intrahepatic bile duct: Secondary | ICD-10-CM | POA: Diagnosis not present

## 2019-02-01 DIAGNOSIS — I10 Essential (primary) hypertension: Secondary | ICD-10-CM | POA: Diagnosis not present

## 2019-02-01 DIAGNOSIS — C186 Malignant neoplasm of descending colon: Secondary | ICD-10-CM

## 2019-02-01 DIAGNOSIS — F329 Major depressive disorder, single episode, unspecified: Secondary | ICD-10-CM

## 2019-02-01 DIAGNOSIS — Z95828 Presence of other vascular implants and grafts: Secondary | ICD-10-CM

## 2019-02-01 DIAGNOSIS — Z8041 Family history of malignant neoplasm of ovary: Secondary | ICD-10-CM | POA: Diagnosis not present

## 2019-02-01 LAB — CBC WITH DIFFERENTIAL (CANCER CENTER ONLY)
Abs Immature Granulocytes: 0.04 10*3/uL (ref 0.00–0.07)
Basophils Absolute: 0 10*3/uL (ref 0.0–0.1)
Basophils Relative: 1 %
Eosinophils Absolute: 0.2 10*3/uL (ref 0.0–0.5)
Eosinophils Relative: 2 %
HCT: 34.7 % — ABNORMAL LOW (ref 39.0–52.0)
Hemoglobin: 11.4 g/dL — ABNORMAL LOW (ref 13.0–17.0)
Immature Granulocytes: 1 %
Lymphocytes Relative: 15 %
Lymphs Abs: 1.2 10*3/uL (ref 0.7–4.0)
MCH: 32.4 pg (ref 26.0–34.0)
MCHC: 32.9 g/dL (ref 30.0–36.0)
MCV: 98.6 fL (ref 80.0–100.0)
Monocytes Absolute: 0.9 10*3/uL (ref 0.1–1.0)
Monocytes Relative: 11 %
Neutro Abs: 5.7 10*3/uL (ref 1.7–7.7)
Neutrophils Relative %: 70 %
Platelet Count: 93 10*3/uL — ABNORMAL LOW (ref 150–400)
RBC: 3.52 MIL/uL — ABNORMAL LOW (ref 4.22–5.81)
RDW: 18 % — ABNORMAL HIGH (ref 11.5–15.5)
WBC Count: 8 10*3/uL (ref 4.0–10.5)
nRBC: 0 % (ref 0.0–0.2)

## 2019-02-01 LAB — CMP (CANCER CENTER ONLY)
ALT: 28 U/L (ref 0–44)
AST: 23 U/L (ref 15–41)
Albumin: 2.8 g/dL — ABNORMAL LOW (ref 3.5–5.0)
Alkaline Phosphatase: 139 U/L — ABNORMAL HIGH (ref 38–126)
Anion gap: 12 (ref 5–15)
BUN: 15 mg/dL (ref 8–23)
CO2: 26 mmol/L (ref 22–32)
Calcium: 9.5 mg/dL (ref 8.9–10.3)
Chloride: 98 mmol/L (ref 98–111)
Creatinine: 1.16 mg/dL (ref 0.61–1.24)
GFR, Est AFR Am: >60 mL/min (ref >60)
GFR, Estimated: >60 mL/min (ref >60)
Glucose, Bld: 229 mg/dL — ABNORMAL HIGH (ref 70–99)
Potassium: 3.3 mmol/L — ABNORMAL LOW (ref 3.5–5.1)
Sodium: 136 mmol/L (ref 135–145)
Total Bilirubin: 0.6 mg/dL (ref 0.3–1.2)
Total Protein: 6.9 g/dL (ref 6.5–8.1)

## 2019-02-01 LAB — CEA (IN HOUSE-CHCC): CEA (CHCC-In House): 4.69 ng/mL (ref 0.00–5.00)

## 2019-02-01 MED ORDER — DEXAMETHASONE SODIUM PHOSPHATE 10 MG/ML IJ SOLN
INTRAMUSCULAR | Status: AC
  Filled 2019-02-01: qty 1

## 2019-02-01 MED ORDER — PALONOSETRON HCL INJECTION 0.25 MG/5ML
0.2500 mg | Freq: Once | INTRAVENOUS | Status: AC
  Administered 2019-02-01: 0.25 mg via INTRAVENOUS

## 2019-02-01 MED ORDER — DEXAMETHASONE SODIUM PHOSPHATE 10 MG/ML IJ SOLN
10.0000 mg | Freq: Once | INTRAMUSCULAR | Status: AC
  Administered 2019-02-01: 10 mg via INTRAVENOUS

## 2019-02-01 MED ORDER — SODIUM CHLORIDE 0.9% FLUSH
10.0000 mL | INTRAVENOUS | Status: DC | PRN
  Administered 2019-02-01: 10 mL
  Filled 2019-02-01: qty 10

## 2019-02-01 MED ORDER — DEXTROSE 5 % IV SOLN
Freq: Once | INTRAVENOUS | Status: AC
  Administered 2019-02-01: 14:00:00 via INTRAVENOUS
  Filled 2019-02-01: qty 250

## 2019-02-01 MED ORDER — OXALIPLATIN CHEMO INJECTION 100 MG/20ML
70.0000 mg/m2 | Freq: Once | INTRAVENOUS | Status: AC
  Administered 2019-02-01: 180 mg via INTRAVENOUS
  Filled 2019-02-01: qty 36

## 2019-02-01 MED ORDER — LEUCOVORIN CALCIUM INJECTION 350 MG
400.0000 mg/m2 | Freq: Once | INTRAVENOUS | Status: AC
  Administered 2019-02-01: 1040 mg via INTRAVENOUS
  Filled 2019-02-01: qty 52

## 2019-02-01 MED ORDER — DEXTROSE 5 % IV SOLN
Freq: Once | INTRAVENOUS | Status: AC
  Administered 2019-02-01: 12:00:00 via INTRAVENOUS
  Filled 2019-02-01: qty 250

## 2019-02-01 MED ORDER — PALONOSETRON HCL INJECTION 0.25 MG/5ML
INTRAVENOUS | Status: AC
  Filled 2019-02-01: qty 5

## 2019-02-01 MED ORDER — FLUOROURACIL CHEMO INJECTION 2.5 GM/50ML
400.0000 mg/m2 | Freq: Once | INTRAVENOUS | Status: AC
  Administered 2019-02-01: 1050 mg via INTRAVENOUS
  Filled 2019-02-01: qty 21

## 2019-02-01 MED ORDER — SODIUM CHLORIDE 0.9 % IV SOLN
2400.0000 mg/m2 | INTRAVENOUS | Status: DC
  Administered 2019-02-01: 6250 mg via INTRAVENOUS
  Filled 2019-02-01: qty 125

## 2019-02-01 NOTE — Telephone Encounter (Signed)
Scheduled per los, declined printout  ° °

## 2019-02-01 NOTE — Progress Notes (Signed)
[place] OFFICE PROGRESS NOTE   Diagnosis: Colon cancer  INTERVAL HISTORY:   [name] completed another cycle of FOLFOX on 01/18/2019.  No nausea, diarrhea, or mouth sores.  He reports mild numbness in the extremities.  This does not interfere with activity.  Objective:  Vital signs in last 24 hours:  Blood pressure 140/80, pulse 74, temperature 99.2 F (37.3 C), temperature source Oral, resp. rate 17, height '6\' 2"'  (1.88 m), weight 281 lb 11.2 oz (127.8 kg), SpO2 97 %.    HEENT: No thrush or ulcers Resp: Lungs clear bilaterally Cardio: Regular rate and rhythm GI: No hepatomegaly, nontender Vascular: No leg edema Neuro: Mild to moderate loss of vibratory sense at the fingertips bilaterally Skin: No rash, palms without erythema  Portacath/PICC-without erythema  Lab Results:  Lab Results  Component Value Date   WBC 8.0 02/01/2019   HGB 11.4 (L) 02/01/2019   HCT 34.7 (L) 02/01/2019   MCV 98.6 02/01/2019   PLT 93 (L) 02/01/2019   NEUTROABS 5.7 02/01/2019    CMP  Lab Results  Component Value Date   NA 136 02/01/2019   K 3.3 (L) 02/01/2019   CL 98 02/01/2019   CO2 26 02/01/2019   GLUCOSE 229 (H) 02/01/2019   BUN 15 02/01/2019   CREATININE 1.16 02/01/2019   CALCIUM 9.5 02/01/2019   PROT 6.9 02/01/2019   ALBUMIN 2.8 (L) 02/01/2019   AST 23 02/01/2019   ALT 28 02/01/2019   ALKPHOS 139 (H) 02/01/2019   BILITOT 0.6 02/01/2019   GFRNONAA >60 02/01/2019   GFRAA >60 02/01/2019    Lab Results  Component Value Date   CEA1 4.23 01/04/2019    Medications: I have reviewed the patient's current medications.   Assessment/Plan: 1. Adenocarcinoma of the descending colon, stage II (T3 N0), status post a left colectomy 01/20/2017 ? MSI-stable, no loss of mismatch repair protein expression ? Elevated preoperative CEA ? Incomplete preoperative colonoscopy ? Colonoscopy 08/07/2017-multiple polyps removed including tubular adenomas ? CT abdomen/pelvis  02/05/2018-no evidence of metastatic disease. ? Elevated CEA June 2019 ? CTs 06/29/2018-no evidence of metastatic disease, stable mild bilateral iliac adenopathy ? PET scan 3/78/5885-[id number] hypermetabolic central right liver metastasis;asymmetric hypermetabolism right palatine tonsil with associated mild asymmetric soft tissue fullness on CT images ? MRI liver 08/25/2018-2.6 cm mass in the central right liver consistent with metastasis, no other evidence of metastatic disease ? Ablation and biopsy of right liver lesion 09/29/2018-pathology revealed adenocarcinoma; foundation 1-KRAS G12V, NRAS wildtype, microsatellite stable, tumor mutational burden 1 ? Cycle 1 FOLFOX 10/25/2018 ? Cycle 2 FOLFOX 11/09/2018 ? Cycle 3 FOLFOX 11/23/2018 ? Cycle 4 FOLFOX 12/07/2018 ? Cycle 5 FOLFOX 12/21/2018 (oxaliplatin dose reduced secondary to thrombocytopenia) ? Cycle 6 FOLFOX 01/04/2019 ? Cycle 7 FOLFOX 01/18/2019 ? Cycle 8 FOLFOX 02/01/2019  2. Enlargement of the right testicle-hydrocele-Testicular ultrasound 02/26/2017-negative for mass, small bilateral hydroceles  3. Hypertension  4. Depression  5. Family history of breast and ovarian cancer, negative genetic testing  6.History of left leg edema, pain.  7.Pigmented lesion right lower leg-refer to dermatology 06/30/2018  8. Port-A-Cath placement 10/13/2018  9.  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 has completed 7 cycles of FOLFOX.  He will complete cycle 8 today.  He continues to tolerate the chemotherapy well.  He has mild thrombocytopenia-stable.  He will call for bleeding or bruising.  [name] has mild oxaliplatin neuropathy symptoms.  We will hold oxaliplatin with  the next cycle of chemotherapy if the neuropathy symptoms progress or if he develops increased thrombocytopenia.  [name] will return for an office visit and chemotherapy in 2 weeks.  We we will  schedule a restaging liver MRI for within the next month.  Betsy Coder, MD  02/01/2019  11:54 AM

## 2019-02-01 NOTE — Progress Notes (Signed)
MD reviewed CBC/CMP: OK to treat with 93,000 platelet count.

## 2019-02-01 NOTE — Patient Instructions (Signed)
[facility] Discharge Instructions for Patients Receiving Chemotherapy  Today you received the following chemotherapy agents Oxaliplatin, Leucovorin, Fluororuracil  To help prevent nausea and vomiting after your treatment, we encourage you to take your nausea medication as directed.  If you develop nausea and vomiting that is not controlled by your nausea medication, call the clinic.   BELOW ARE SYMPTOMS THAT SHOULD BE REPORTED IMMEDIATELY:  *FEVER GREATER THAN 100.5 F  *CHILLS WITH OR WITHOUT FEVER  NAUSEA AND VOMITING THAT IS NOT CONTROLLED WITH YOUR NAUSEA MEDICATION  *UNUSUAL SHORTNESS OF BREATH  *UNUSUAL BRUISING OR BLEEDING  TENDERNESS IN MOUTH AND THROAT WITH OR WITHOUT PRESENCE OF ULCERS  *URINARY PROBLEMS  *BOWEL PROBLEMS  UNUSUAL RASH Items with * indicate a potential emergency and should be followed up as soon as possible.  Feel free to call the clinic should you have any questions or concerns. The clinic phone number is [phone].  Please show the CHEMO ALERT CARD at check-in to the Emergency Department and triage nurse.   

## 2019-02-01 NOTE — Progress Notes (Signed)
Per Dr. Benay Spice it is ok to treat with platelet count of 93

## 2019-02-03 ENCOUNTER — Other Ambulatory Visit: Payer: Self-pay

## 2019-02-03 ENCOUNTER — Inpatient Hospital Stay: Payer: Medicare Other

## 2019-02-03 VITALS — BP 167/92 | HR 72 | Temp 98.3°F | Resp 18

## 2019-02-03 DIAGNOSIS — Z5111 Encounter for antineoplastic chemotherapy: Secondary | ICD-10-CM | POA: Diagnosis not present

## 2019-02-03 DIAGNOSIS — C186 Malignant neoplasm of descending colon: Secondary | ICD-10-CM | POA: Diagnosis not present

## 2019-02-03 DIAGNOSIS — C787 Secondary malignant neoplasm of liver and intrahepatic bile duct: Secondary | ICD-10-CM | POA: Diagnosis not present

## 2019-02-03 MED ORDER — SODIUM CHLORIDE 0.9% FLUSH
10.0000 mL | INTRAVENOUS | Status: DC | PRN
  Administered 2019-02-03: 10 mL
  Filled 2019-02-03: qty 10

## 2019-02-03 MED ORDER — HEPARIN SOD (PORK) LOCK FLUSH 100 UNIT/ML IV SOLN
500.0000 [IU] | Freq: Once | INTRAVENOUS | Status: AC | PRN
  Administered 2019-02-03: 500 [IU]
  Filled 2019-02-03: qty 5

## 2019-02-13 ENCOUNTER — Other Ambulatory Visit: Payer: Self-pay | Admitting: Oncology

## 2019-02-15 ENCOUNTER — Other Ambulatory Visit: Payer: Self-pay | Admitting: Nurse Practitioner

## 2019-02-15 ENCOUNTER — Inpatient Hospital Stay: Payer: Medicare Other

## 2019-02-15 ENCOUNTER — Telehealth: Payer: Self-pay

## 2019-02-15 ENCOUNTER — Telehealth: Payer: Self-pay | Admitting: Nurse Practitioner

## 2019-02-15 ENCOUNTER — Inpatient Hospital Stay (HOSPITAL_BASED_OUTPATIENT_CLINIC_OR_DEPARTMENT_OTHER): Payer: Medicare Other | Admitting: Nurse Practitioner

## 2019-02-15 ENCOUNTER — Encounter: Payer: Self-pay | Admitting: Nurse Practitioner

## 2019-02-15 ENCOUNTER — Other Ambulatory Visit: Payer: Self-pay

## 2019-02-15 VITALS — BP 130/72 | HR 65 | Temp 98.2°F | Resp 19 | Ht 74.0 in | Wt 277.3 lb

## 2019-02-15 DIAGNOSIS — G620 Drug-induced polyneuropathy: Secondary | ICD-10-CM | POA: Diagnosis not present

## 2019-02-15 DIAGNOSIS — Z8041 Family history of malignant neoplasm of ovary: Secondary | ICD-10-CM

## 2019-02-15 DIAGNOSIS — F329 Major depressive disorder, single episode, unspecified: Secondary | ICD-10-CM | POA: Diagnosis not present

## 2019-02-15 DIAGNOSIS — C186 Malignant neoplasm of descending colon: Secondary | ICD-10-CM

## 2019-02-15 DIAGNOSIS — N433 Hydrocele, unspecified: Secondary | ICD-10-CM

## 2019-02-15 DIAGNOSIS — Z79899 Other long term (current) drug therapy: Secondary | ICD-10-CM

## 2019-02-15 DIAGNOSIS — I10 Essential (primary) hypertension: Secondary | ICD-10-CM | POA: Diagnosis not present

## 2019-02-15 DIAGNOSIS — C787 Secondary malignant neoplasm of liver and intrahepatic bile duct: Secondary | ICD-10-CM | POA: Diagnosis not present

## 2019-02-15 DIAGNOSIS — D6959 Other secondary thrombocytopenia: Secondary | ICD-10-CM

## 2019-02-15 DIAGNOSIS — Z95828 Presence of other vascular implants and grafts: Secondary | ICD-10-CM

## 2019-02-15 DIAGNOSIS — Z5111 Encounter for antineoplastic chemotherapy: Secondary | ICD-10-CM | POA: Diagnosis not present

## 2019-02-15 LAB — CMP (CANCER CENTER ONLY)
ALT: 27 U/L (ref 0–44)
AST: 30 U/L (ref 15–41)
Albumin: 3 g/dL — ABNORMAL LOW (ref 3.5–5.0)
Alkaline Phosphatase: 136 U/L — ABNORMAL HIGH (ref 38–126)
Anion gap: 10 (ref 5–15)
BUN: 16 mg/dL (ref 8–23)
CO2: 26 mmol/L (ref 22–32)
Calcium: 9 mg/dL (ref 8.9–10.3)
Chloride: 100 mmol/L (ref 98–111)
Creatinine: 1.1 mg/dL (ref 0.61–1.24)
GFR, Est AFR Am: >60 mL/min (ref >60)
GFR, Estimated: >60 mL/min (ref >60)
Glucose, Bld: 179 mg/dL — ABNORMAL HIGH (ref 70–99)
Potassium: 3.5 mmol/L (ref 3.5–5.1)
Sodium: 136 mmol/L (ref 135–145)
Total Bilirubin: 0.7 mg/dL (ref 0.3–1.2)
Total Protein: 6.8 g/dL (ref 6.5–8.1)

## 2019-02-15 LAB — CBC WITH DIFFERENTIAL (CANCER CENTER ONLY)
Abs Immature Granulocytes: 0.04 10*3/uL (ref 0.00–0.07)
Basophils Absolute: 0 10*3/uL (ref 0.0–0.1)
Basophils Relative: 1 %
Eosinophils Absolute: 0.1 10*3/uL (ref 0.0–0.5)
Eosinophils Relative: 2 %
HCT: 34 % — ABNORMAL LOW (ref 39.0–52.0)
Hemoglobin: 11.2 g/dL — ABNORMAL LOW (ref 13.0–17.0)
Immature Granulocytes: 1 %
Lymphocytes Relative: 16 %
Lymphs Abs: 1.2 10*3/uL (ref 0.7–4.0)
MCH: 32.8 pg (ref 26.0–34.0)
MCHC: 32.9 g/dL (ref 30.0–36.0)
MCV: 99.7 fL (ref 80.0–100.0)
Monocytes Absolute: 0.9 10*3/uL (ref 0.1–1.0)
Monocytes Relative: 11 %
Neutro Abs: 5.4 10*3/uL (ref 1.7–7.7)
Neutrophils Relative %: 69 %
Platelet Count: 87 10*3/uL — ABNORMAL LOW (ref 150–400)
RBC: 3.41 MIL/uL — ABNORMAL LOW (ref 4.22–5.81)
RDW: 16.9 % — ABNORMAL HIGH (ref 11.5–15.5)
WBC Count: 7.7 10*3/uL (ref 4.0–10.5)
nRBC: 0 % (ref 0.0–0.2)

## 2019-02-15 MED ORDER — LORAZEPAM 0.5 MG PO TABS: 0.5000 mg | ORAL_TABLET | Freq: Two times a day (BID) | ORAL | 0 refills | Status: DC | PRN

## 2019-02-15 MED ORDER — FLUOROURACIL CHEMO INJECTION 2.5 GM/50ML
400.0000 mg/m2 | Freq: Once | INTRAVENOUS | Status: AC
  Administered 2019-02-15: 1050 mg via INTRAVENOUS
  Filled 2019-02-15: qty 21

## 2019-02-15 MED ORDER — DEXTROSE 5 % IV SOLN
Freq: Once | INTRAVENOUS | Status: AC
  Administered 2019-02-15: 12:00:00 via INTRAVENOUS
  Filled 2019-02-15: qty 250

## 2019-02-15 MED ORDER — OXALIPLATIN CHEMO INJECTION 100 MG/20ML
70.0000 mg/m2 | Freq: Once | INTRAVENOUS | Status: AC
  Administered 2019-02-15: 180 mg via INTRAVENOUS
  Filled 2019-02-15: qty 36

## 2019-02-15 MED ORDER — DEXAMETHASONE SODIUM PHOSPHATE 10 MG/ML IJ SOLN
INTRAMUSCULAR | Status: AC
  Filled 2019-02-15: qty 1

## 2019-02-15 MED ORDER — PALONOSETRON HCL INJECTION 0.25 MG/5ML
0.2500 mg | Freq: Once | INTRAVENOUS | Status: AC
  Administered 2019-02-15: 0.25 mg via INTRAVENOUS

## 2019-02-15 MED ORDER — SODIUM CHLORIDE 0.9% FLUSH
10.0000 mL | INTRAVENOUS | Status: DC | PRN
  Administered 2019-02-15: 10 mL
  Filled 2019-02-15: qty 10

## 2019-02-15 MED ORDER — POTASSIUM CHLORIDE CRYS ER 20 MEQ PO TBCR: 20.0000 meq | EXTENDED_RELEASE_TABLET | Freq: Every day | ORAL | 1 refills | Status: DC

## 2019-02-15 MED ORDER — DEXAMETHASONE SODIUM PHOSPHATE 10 MG/ML IJ SOLN
10.0000 mg | Freq: Once | INTRAMUSCULAR | Status: AC
  Administered 2019-02-15: 10 mg via INTRAVENOUS

## 2019-02-15 MED ORDER — SODIUM CHLORIDE 0.9 % IV SOLN
2400.0000 mg/m2 | INTRAVENOUS | Status: DC
  Administered 2019-02-15: 6250 mg via INTRAVENOUS
  Filled 2019-02-15: qty 125

## 2019-02-15 MED ORDER — DEXTROSE 5 % IV SOLN
Freq: Once | INTRAVENOUS | Status: DC
  Filled 2019-02-15: qty 250

## 2019-02-15 MED ORDER — PALONOSETRON HCL INJECTION 0.25 MG/5ML
INTRAVENOUS | Status: AC
  Filled 2019-02-15: qty 5

## 2019-02-15 MED ORDER — LEUCOVORIN CALCIUM INJECTION 350 MG
400.0000 mg/m2 | Freq: Once | INTRAVENOUS | Status: AC
  Administered 2019-02-15: 1040 mg via INTRAVENOUS
  Filled 2019-02-15: qty 52

## 2019-02-15 NOTE — Progress Notes (Signed)
  [place] OFFICE PROGRESS NOTE   Diagnosis: Colon cancer  INTERVAL HISTORY:   Jeremy Stevenson returns as scheduled.  He completed cycle 8 FOLFOX 02/01/2019.  He denies nausea/vomiting.  No mouth sores.  Mild diarrhea.  Cold sensitivity lasted about 2 days.  No persistent neuropathy symptoms.  No fever, cough, shortness of breath.  Objective:  Vital signs in last 24 hours:  Blood pressure 130/72, pulse 65, temperature 98.2 F (36.8 C), temperature source Oral, resp. rate 19, height _0  (1.88 m), weight 277 lb 4.8 oz (125.8 kg), SpO2 99 %.    HEENT: Mild white coating over tongue.  No buccal thrush.  No ulcers. Resp: Respirations even and unlabored. Vascular: No leg edema.  Superficial abrasions left lower leg without surrounding erythema. Neuro: Vibratory sense intact over the fingertips per tuning fork exam. Skin: Palms without erythema. Port-A-Cath without erythema.   Lab Results:  Lab Results  Component Value Date   WBC 7.7 02/15/2019   HGB 11.2 (L) 02/15/2019   HCT 34.0 (L) 02/15/2019   MCV 99.7 02/15/2019   PLT 87 (L) 02/15/2019   NEUTROABS 5.4 02/15/2019    Imaging:  No results found.  Medications: I have reviewed the patient's current medications.  Assessment/Plan: 1. Adenocarcinoma of the descending colon, stage II (T3 N0), status post a left colectomy 01/20/2017 ? MSI-stable, no loss of mismatch repair protein expression ? Elevated preoperative CEA ? Incomplete preoperative colonoscopy ? Colonoscopy 08/07/2017-multiple polyps removed including tubular adenomas ? CT abdomen/pelvis 02/05/2018-no evidence of metastatic disease. ? Elevated CEA June 2019 ? CTs 06/29/2018-no evidence of metastatic disease, stable mild bilateral iliac adenopathy ? PET scan 9/37/9024-OXBDZHGD hypermetabolic central right liver metastasis;asymmetric hypermetabolism right palatine tonsil with associated mild asymmetric soft tissue fullness on CT images ? MRI liver  08/25/2018-2.6 cm mass in the central right liver consistent with metastasis, no other evidence of metastatic disease ? Ablation and biopsy of right liver lesion 09/29/2018-pathology revealed adenocarcinoma; foundation 1-KRAS G12V, NRAS wildtype, microsatellite stable, tumor mutational burden 1 ? Cycle 1 FOLFOX 10/25/2018 ? Cycle 2 FOLFOX 11/09/2018 ? Cycle 3 FOLFOX 11/23/2018 ? Cycle 4 FOLFOX 12/07/2018 ? Cycle 5 FOLFOX 12/21/2018 (oxaliplatin dose reduced secondary to thrombocytopenia) ? Cycle 6 FOLFOX 01/04/2019 ? Cycle 7 FOLFOX 01/18/2019 ? Cycle 8 FOLFOX 02/01/2019 ? Cycle 9 FOLFOX 02/15/2019  2. Enlargement of the right testicle-hydrocele-Testicular ultrasound 02/26/2017-negative for mass, small bilateral hydroceles  3. Hypertension  4. Depression  5. Family history of breast and ovarian cancer, negative genetic testing  6.History of left leg edema, pain.  7.Pigmented lesion right lower leg-refer to dermatology 06/30/2018  8. Port-A-Cath placement 10/13/2018  9.  Thrombocytopenia secondary to chemotherapy  10.  Oxaliplatin neuropathy  Disposition: Jeremy Stevenson appears stable.  He has completed 8 cycles of FOLFOX.  Plan to proceed with cycle 9 today as scheduled.  He will undergo a restaging liver MRI prior to his next visit.  We reviewed the CBC from today.  He has stable mild thrombocytopenia.  Counts are adequate for treatment.  He understands to contact the office with any bleeding.  He will return for lab, follow-up and cycle 10 FOLFOX in 2 weeks.  He will contact the office in the interim with any problems.    [name] ANP/GNP-BC   02/15/2019  10:48 AM

## 2019-02-15 NOTE — Telephone Encounter (Signed)
Per [name] NP TC to Pt to inform Pt. Of low normal range potassium which was 3.5 Spoke with [name] to inform her that Jeremy Stevenson will send a prescription for potassium to the pharmacy and that [name] should continue taking 1 potassium tablet a day. [name] verbalized understanding no further problems or concerns noted.

## 2019-02-15 NOTE — Progress Notes (Signed)
Ok to treat with platelets 87 per [name], NP

## 2019-02-15 NOTE — Telephone Encounter (Signed)
Scheduled appt per 3/31 los 

## 2019-02-15 NOTE — Patient Instructions (Signed)
[facility] Discharge Instructions for Patients Receiving Chemotherapy  Today you received the following chemotherapy agents Oxaliplatin, Leucovorin, Fluororuracil  To help prevent nausea and vomiting after your treatment, we encourage you to take your nausea medication as directed.  If you develop nausea and vomiting that is not controlled by your nausea medication, call the clinic.   BELOW ARE SYMPTOMS THAT SHOULD BE REPORTED IMMEDIATELY:  *FEVER GREATER THAN 100.5 F  *CHILLS WITH OR WITHOUT FEVER  NAUSEA AND VOMITING THAT IS NOT CONTROLLED WITH YOUR NAUSEA MEDICATION  *UNUSUAL SHORTNESS OF BREATH  *UNUSUAL BRUISING OR BLEEDING  TENDERNESS IN MOUTH AND THROAT WITH OR WITHOUT PRESENCE OF ULCERS  *URINARY PROBLEMS  *BOWEL PROBLEMS  UNUSUAL RASH Items with * indicate a potential emergency and should be followed up as soon as possible.  Feel free to call the clinic should you have any questions or concerns. The clinic phone number is [phone].  Please show the [place] at check-in to the Emergency Department and triage nurse.

## 2019-02-15 NOTE — Addendum Note (Signed)
Addended by: Owens Shark on: 02/15/2019 11:49 AM   Modules accepted: Orders

## 2019-02-17 ENCOUNTER — Inpatient Hospital Stay: Payer: Medicare Other | Attending: Oncology

## 2019-02-17 ENCOUNTER — Other Ambulatory Visit: Payer: Self-pay

## 2019-02-17 VITALS — BP 160/96 | HR 73 | Temp 98.9°F | Resp 18

## 2019-02-17 DIAGNOSIS — C787 Secondary malignant neoplasm of liver and intrahepatic bile duct: Secondary | ICD-10-CM | POA: Insufficient documentation

## 2019-02-17 DIAGNOSIS — D6959 Other secondary thrombocytopenia: Secondary | ICD-10-CM | POA: Diagnosis not present

## 2019-02-17 DIAGNOSIS — C186 Malignant neoplasm of descending colon: Secondary | ICD-10-CM | POA: Insufficient documentation

## 2019-02-17 DIAGNOSIS — F329 Major depressive disorder, single episode, unspecified: Secondary | ICD-10-CM | POA: Diagnosis not present

## 2019-02-17 DIAGNOSIS — I10 Essential (primary) hypertension: Secondary | ICD-10-CM | POA: Diagnosis not present

## 2019-02-17 DIAGNOSIS — G620 Drug-induced polyneuropathy: Secondary | ICD-10-CM | POA: Diagnosis not present

## 2019-02-17 DIAGNOSIS — Z5111 Encounter for antineoplastic chemotherapy: Secondary | ICD-10-CM | POA: Diagnosis not present

## 2019-02-17 DIAGNOSIS — T451X5D Adverse effect of antineoplastic and immunosuppressive drugs, subsequent encounter: Secondary | ICD-10-CM | POA: Insufficient documentation

## 2019-02-17 MED ORDER — HEPARIN SOD (PORK) LOCK FLUSH 100 UNIT/ML IV SOLN
500.0000 [IU] | Freq: Once | INTRAVENOUS | Status: AC | PRN
  Administered 2019-02-17: 13:00:00 500 [IU]
  Filled 2019-02-17: qty 5

## 2019-02-17 MED ORDER — SODIUM CHLORIDE 0.9% FLUSH
10.0000 mL | INTRAVENOUS | Status: DC | PRN
  Administered 2019-02-17: 10 mL
  Filled 2019-02-17: qty 10

## 2019-02-24 ENCOUNTER — Ambulatory Visit (HOSPITAL_COMMUNITY)
Admission: RE | Admit: 2019-02-24 | Discharge: 2019-02-24 | Disposition: A | Discharge status: Home / Self Care | Payer: Medicare Other | Source: Ambulatory Visit | Attending: Oncology | Admitting: Oncology

## 2019-02-24 ENCOUNTER — Other Ambulatory Visit: Payer: Self-pay

## 2019-02-24 DIAGNOSIS — C186 Malignant neoplasm of descending colon: Secondary | ICD-10-CM | POA: Insufficient documentation

## 2019-02-24 MED ORDER — GADOBUTROL 1 MMOL/ML IV SOLN
10.0000 mL | Freq: Once | INTRAVENOUS | Status: AC | PRN
  Administered 2019-02-24: 10 mL via INTRAVENOUS

## 2019-02-25 ENCOUNTER — Telehealth: Payer: Self-pay | Admitting: *Deleted

## 2019-02-25 NOTE — Telephone Encounter (Signed)
-----   Message from Tania Ade, RN sent at 02/25/2019 10:09 AM EDT -----  ----- Message ----- From: Ladell Pier, MD Sent: 02/24/2019   6:07 PM EDT To: Ar[facility]  Please call patient, MRI shows ablation site in the liver without evidence of residual tumor, no new liver lesions, follow-up as scheduled

## 2019-02-25 NOTE — Telephone Encounter (Signed)
Spoke with wife Selena, and informed her of pt's  MRI results as per Dr. Gearldine Shown instructions below.  Wife voiced understanding and stated she would tell pt immediately.

## 2019-02-27 ENCOUNTER — Other Ambulatory Visit: Payer: Self-pay | Admitting: Oncology

## 2019-02-28 ENCOUNTER — Inpatient Hospital Stay: Payer: Medicare Other

## 2019-02-28 ENCOUNTER — Inpatient Hospital Stay (HOSPITAL_BASED_OUTPATIENT_CLINIC_OR_DEPARTMENT_OTHER): Payer: Medicare Other | Admitting: Nurse Practitioner

## 2019-02-28 ENCOUNTER — Encounter: Payer: Self-pay | Admitting: Nurse Practitioner

## 2019-02-28 ENCOUNTER — Other Ambulatory Visit: Payer: Self-pay

## 2019-02-28 ENCOUNTER — Telehealth: Payer: Self-pay | Admitting: Nurse Practitioner

## 2019-02-28 VITALS — BP 114/94 | HR 65 | Temp 98.4°F | Resp 18 | Ht 74.0 in | Wt 272.3 lb

## 2019-02-28 VITALS — BP 141/90

## 2019-02-28 DIAGNOSIS — C186 Malignant neoplasm of descending colon: Secondary | ICD-10-CM

## 2019-02-28 DIAGNOSIS — F329 Major depressive disorder, single episode, unspecified: Secondary | ICD-10-CM

## 2019-02-28 DIAGNOSIS — Z5111 Encounter for antineoplastic chemotherapy: Secondary | ICD-10-CM | POA: Diagnosis not present

## 2019-02-28 DIAGNOSIS — G620 Drug-induced polyneuropathy: Secondary | ICD-10-CM

## 2019-02-28 DIAGNOSIS — C787 Secondary malignant neoplasm of liver and intrahepatic bile duct: Secondary | ICD-10-CM

## 2019-02-28 DIAGNOSIS — D6959 Other secondary thrombocytopenia: Secondary | ICD-10-CM

## 2019-02-28 DIAGNOSIS — I10 Essential (primary) hypertension: Secondary | ICD-10-CM | POA: Diagnosis not present

## 2019-02-28 DIAGNOSIS — Z95828 Presence of other vascular implants and grafts: Secondary | ICD-10-CM

## 2019-02-28 LAB — CBC WITH DIFFERENTIAL (CANCER CENTER ONLY)
Abs Immature Granulocytes: 0.02 10*3/uL (ref 0.00–0.07)
Basophils Absolute: 0 10*3/uL (ref 0.0–0.1)
Basophils Relative: 0 %
Eosinophils Absolute: 0.2 10*3/uL (ref 0.0–0.5)
Eosinophils Relative: 2 %
HCT: 34.3 % — ABNORMAL LOW (ref 39.0–52.0)
Hemoglobin: 11.3 g/dL — ABNORMAL LOW (ref 13.0–17.0)
Immature Granulocytes: 0 %
Lymphocytes Relative: 20 %
Lymphs Abs: 1.4 10*3/uL (ref 0.7–4.0)
MCH: 33.3 pg (ref 26.0–34.0)
MCHC: 32.9 g/dL (ref 30.0–36.0)
MCV: 101.2 fL — ABNORMAL HIGH (ref 80.0–100.0)
Monocytes Absolute: 0.7 10*3/uL (ref 0.1–1.0)
Monocytes Relative: 11 %
Neutro Abs: 4.4 10*3/uL (ref 1.7–7.7)
Neutrophils Relative %: 67 %
Platelet Count: 82 10*3/uL — ABNORMAL LOW (ref 150–400)
RBC: 3.39 MIL/uL — ABNORMAL LOW (ref 4.22–5.81)
RDW: 16.7 % — ABNORMAL HIGH (ref 11.5–15.5)
WBC Count: 6.7 10*3/uL (ref 4.0–10.5)
nRBC: 0 % (ref 0.0–0.2)

## 2019-02-28 LAB — CMP (CANCER CENTER ONLY)
ALT: 29 U/L (ref 0–44)
AST: 33 U/L (ref 15–41)
Albumin: 3.2 g/dL — ABNORMAL LOW (ref 3.5–5.0)
Alkaline Phosphatase: 147 U/L — ABNORMAL HIGH (ref 38–126)
Anion gap: 10 (ref 5–15)
BUN: 18 mg/dL (ref 8–23)
CO2: 27 mmol/L (ref 22–32)
Calcium: 9.4 mg/dL (ref 8.9–10.3)
Chloride: 101 mmol/L (ref 98–111)
Creatinine: 1.22 mg/dL (ref 0.61–1.24)
GFR, Est AFR Am: >60 mL/min (ref >60)
GFR, Estimated: >60 mL/min (ref >60)
Glucose, Bld: 136 mg/dL — ABNORMAL HIGH (ref 70–99)
Potassium: 3.3 mmol/L — ABNORMAL LOW (ref 3.5–5.1)
Sodium: 138 mmol/L (ref 135–145)
Total Bilirubin: 0.5 mg/dL (ref 0.3–1.2)
Total Protein: 7.2 g/dL (ref 6.5–8.1)

## 2019-02-28 MED ORDER — SODIUM CHLORIDE 0.9% FLUSH
10.0000 mL | INTRAVENOUS | Status: DC | PRN
  Administered 2019-02-28: 10 mL
  Filled 2019-02-28: qty 10

## 2019-02-28 MED ORDER — ONDANSETRON HCL 8 MG PO TABS
ORAL_TABLET | ORAL | Status: AC
  Filled 2019-02-28: qty 1

## 2019-02-28 MED ORDER — ONDANSETRON HCL 8 MG PO TABS
8.0000 mg | ORAL_TABLET | Freq: Once | ORAL | Status: AC
  Administered 2019-02-28: 13:00:00 8 mg via ORAL

## 2019-02-28 MED ORDER — FLUOROURACIL CHEMO INJECTION 2.5 GM/50ML
400.0000 mg/m2 | Freq: Once | INTRAVENOUS | Status: AC
  Administered 2019-02-28: 15:00:00 1050 mg via INTRAVENOUS
  Filled 2019-02-28: qty 21

## 2019-02-28 MED ORDER — SODIUM CHLORIDE 0.9 % IV SOLN
INTRAVENOUS | Status: DC
  Administered 2019-02-28: 13:00:00 via INTRAVENOUS
  Filled 2019-02-28: qty 250

## 2019-02-28 MED ORDER — SODIUM CHLORIDE 0.9 % IV SOLN
2400.0000 mg/m2 | INTRAVENOUS | Status: DC
  Administered 2019-02-28: 6250 mg via INTRAVENOUS
  Filled 2019-02-28: qty 125

## 2019-02-28 MED ORDER — LEUCOVORIN CALCIUM INJECTION 350 MG
400.0000 mg/m2 | Freq: Once | INTRAVENOUS | Status: AC
  Administered 2019-02-28: 13:00:00 1040 mg via INTRAVENOUS
  Filled 2019-02-28: qty 52

## 2019-02-28 NOTE — Telephone Encounter (Signed)
Scheduled appt per 4/13 los.  Per 4/13 los it stated to put f/u with GBS, he didn't have any availability.

## 2019-02-28 NOTE — Patient Instructions (Signed)
[facility] Discharge Instructions for Patients Receiving Chemotherapy  Today you received the following chemotherapy agents Leucovorin and 5FU  To help prevent nausea and vomiting after your treatment, we encourage you to take your nausea medication as directed If you develop nausea and vomiting that is not controlled by your nausea medication, call the clinic.   BELOW ARE SYMPTOMS THAT SHOULD BE REPORTED IMMEDIATELY:  *FEVER GREATER THAN 100.5 F  *CHILLS WITH OR WITHOUT FEVER  NAUSEA AND VOMITING THAT IS NOT CONTROLLED WITH YOUR NAUSEA MEDICATION  *UNUSUAL SHORTNESS OF BREATH  *UNUSUAL BRUISING OR BLEEDING  TENDERNESS IN MOUTH AND THROAT WITH OR WITHOUT PRESENCE OF ULCERS  *URINARY PROBLEMS  *BOWEL PROBLEMS  UNUSUAL RASH Items with * indicate a potential emergency and should be followed up as soon as possible.  Feel free to call the clinic should you have any questions or concerns. The clinic phone number is [phone].  Please show the [facility] at check-in to the Emergency Department and triage nurse.   

## 2019-02-28 NOTE — Progress Notes (Signed)
Per [name] NP, ok to treat with low platelets. Will omit Oxaliplatin today.

## 2019-02-28 NOTE — Progress Notes (Addendum)
[facility] OFFICE PROGRESS NOTE   Diagnosis: Colon cancer  INTERVAL HISTORY:   Mr. Martinique returns as scheduled.  He completed cycle 9 FOLFOX 02/15/2019.  He denies nausea/vomiting.  No mouth sores.  He has mild diarrhea which was controlled with an antidiarrheal medication.  He has mild persistent cold sensitivity.  No numbness or tingling in the absence of cold exposure.  Objective:  Vital signs in last 24 hours:  Blood pressure (!) 114/94, pulse 65, temperature 98.4 F (36.9 C), temperature source Oral, resp. rate 18, height '6\' 2"'  (1.88 m), weight 272 lb 4.8 oz (123.5 kg), SpO2 100 %.    HEENT: Mild white coating over tongue.  No ulcers. Resp: Respirations even and unlabored. Vascular: No leg edema. Neuro: Vibratory sense mildly decreased over the fingertips per tuning fork exam. Skin: Palms without erythema. Port-A-Cath without erythema.   Lab Results:  Lab Results  Component Value Date   WBC 6.7 02/28/2019   HGB 11.3 (L) 02/28/2019   HCT 34.3 (L) 02/28/2019   MCV 101.2 (H) 02/28/2019   PLT 82 (L) 02/28/2019   NEUTROABS 4.4 02/28/2019    Imaging:  No results found.  Medications: I have reviewed the patient's current medications.  Assessment/Plan: 1. Adenocarcinoma of the descending colon, stage II (T3 N0), status post a left colectomy 01/20/2017 ? MSI-stable, no loss of mismatch repair protein expression ? Elevated preoperative CEA ? Incomplete preoperative colonoscopy ? Colonoscopy 08/07/2017-multiple polyps removed including tubular adenomas ? CT abdomen/pelvis 02/05/2018-no evidence of metastatic disease. ? Elevated CEA June 2019 ? CTs 06/29/2018-no evidence of metastatic disease, stable mild bilateral iliac adenopathy ? PET scan 07/18/5175-HYWVPXTG hypermetabolic central right liver metastasis;asymmetric hypermetabolism right palatine tonsil with associated mild asymmetric soft tissue fullness on CT images ? MRI liver 08/25/2018-2.6 cm mass  in the central right liver consistent with metastasis, no other evidence of metastatic disease ? Ablation and biopsy of right liver lesion 09/29/2018-pathology revealed adenocarcinoma; foundation 1-KRAS G12V, NRAS wildtype, microsatellite stable, tumor mutational burden 1 ? Cycle 1 FOLFOX 10/25/2018 ? Cycle 2 FOLFOX 11/09/2018 ? Cycle 3 FOLFOX 11/23/2018 ? Cycle 4 FOLFOX 12/07/2018 ? Cycle 5 FOLFOX 12/21/2018 (oxaliplatin dose reduced secondary to thrombocytopenia) ? Cycle 6 FOLFOX 01/04/2019 ? Cycle 7 FOLFOX 01/18/2019 ? Cycle 8 FOLFOX 02/01/2019 ? Cycle 9 FOLFOX 02/15/2019 ? MRI abdomen 02/24/2019- ablation site in the liver without residual tumor identified.  Mild intrahepatic biliary dilatation distal to the ablation site.  Diffuse hepatic steatosis.  No new liver lesions identified. ? Cycle 10 FOLFOX 02/28/2019 (oxaliplatin held due to neuropathy and thrombocytopenia)  2. Enlargement of the right testicle-hydrocele-Testicular ultrasound 02/26/2017-negative for mass, small bilateral hydroceles  3. Hypertension  4. Depression  5. Family history of breast and ovarian cancer, negative genetic testing  6.History of left leg edema, pain.  7.Pigmented lesion right lower leg-refer to dermatology 06/30/2018  8. Port-A-Cath placement 10/13/2018  9.Thrombocytopenia secondary to chemotherapy  10.Oxaliplatin neuropathy  Disposition: Mr. Martinique appears stable.  He has completed 9 cycles of FOLFOX.  Plan to proceed with cycle 10 today as scheduled.  Oxaliplatin will be held due to increased neuropathy and progressive thrombocytopenia.  We reviewed the MRI result with him at today's visit.  No residual tumor at the ablation site and no new liver lesions.  The plan is to complete 6 months of adjuvant chemotherapy.  He will return for lab, follow-up and cycle 11 FOLFOX in 2 weeks.  He will contact the office in the interim with any problems.  Patient seen with Dr. Benay Spice.   [name] ANP/GNP-BC   02/28/2019  11:41 AM  This was a shared visit with [name].  The restaging MRI of the liver reveals no evidence of tumor at the ablation site and no evidence of disease progression. He has developed mild oxaliplatin neuropathy and the platelets are low.  We decided to hold oxalic plan from the chemotherapy regimen today.  Julieanne Manson, MD

## 2019-03-02 ENCOUNTER — Inpatient Hospital Stay: Payer: Medicare Other

## 2019-03-02 ENCOUNTER — Other Ambulatory Visit: Payer: Self-pay

## 2019-03-02 VITALS — BP 143/86 | HR 68 | Temp 97.8°F | Resp 18

## 2019-03-02 DIAGNOSIS — C186 Malignant neoplasm of descending colon: Secondary | ICD-10-CM | POA: Diagnosis not present

## 2019-03-02 DIAGNOSIS — C787 Secondary malignant neoplasm of liver and intrahepatic bile duct: Secondary | ICD-10-CM | POA: Diagnosis not present

## 2019-03-02 DIAGNOSIS — D6959 Other secondary thrombocytopenia: Secondary | ICD-10-CM | POA: Diagnosis not present

## 2019-03-02 DIAGNOSIS — I10 Essential (primary) hypertension: Secondary | ICD-10-CM | POA: Diagnosis not present

## 2019-03-02 DIAGNOSIS — Z5111 Encounter for antineoplastic chemotherapy: Secondary | ICD-10-CM | POA: Diagnosis not present

## 2019-03-02 DIAGNOSIS — F329 Major depressive disorder, single episode, unspecified: Secondary | ICD-10-CM | POA: Diagnosis not present

## 2019-03-02 MED ORDER — HEPARIN SOD (PORK) LOCK FLUSH 100 UNIT/ML IV SOLN
500.0000 [IU] | Freq: Once | INTRAVENOUS | Status: AC | PRN
  Administered 2019-03-02: 500 [IU]
  Filled 2019-03-02: qty 5

## 2019-03-02 MED ORDER — SODIUM CHLORIDE 0.9% FLUSH
10.0000 mL | INTRAVENOUS | Status: DC | PRN
  Administered 2019-03-02: 10 mL
  Filled 2019-03-02: qty 10

## 2019-03-13 ENCOUNTER — Other Ambulatory Visit: Payer: Self-pay | Admitting: Oncology

## 2019-03-14 ENCOUNTER — Other Ambulatory Visit: Payer: Self-pay

## 2019-03-14 ENCOUNTER — Inpatient Hospital Stay: Payer: Medicare Other

## 2019-03-14 ENCOUNTER — Inpatient Hospital Stay (HOSPITAL_BASED_OUTPATIENT_CLINIC_OR_DEPARTMENT_OTHER): Payer: Medicare Other | Admitting: Nurse Practitioner

## 2019-03-14 ENCOUNTER — Telehealth: Payer: Self-pay | Admitting: Nurse Practitioner

## 2019-03-14 ENCOUNTER — Encounter: Payer: Self-pay | Admitting: Nurse Practitioner

## 2019-03-14 VITALS — BP 146/94 | HR 67 | Temp 98.2°F | Resp 18 | Ht 74.0 in | Wt 269.2 lb

## 2019-03-14 DIAGNOSIS — D6959 Other secondary thrombocytopenia: Secondary | ICD-10-CM | POA: Diagnosis not present

## 2019-03-14 DIAGNOSIS — G620 Drug-induced polyneuropathy: Secondary | ICD-10-CM

## 2019-03-14 DIAGNOSIS — C186 Malignant neoplasm of descending colon: Secondary | ICD-10-CM

## 2019-03-14 DIAGNOSIS — Z5111 Encounter for antineoplastic chemotherapy: Secondary | ICD-10-CM | POA: Diagnosis not present

## 2019-03-14 DIAGNOSIS — C787 Secondary malignant neoplasm of liver and intrahepatic bile duct: Secondary | ICD-10-CM | POA: Diagnosis not present

## 2019-03-14 DIAGNOSIS — F329 Major depressive disorder, single episode, unspecified: Secondary | ICD-10-CM | POA: Diagnosis not present

## 2019-03-14 DIAGNOSIS — Z95828 Presence of other vascular implants and grafts: Secondary | ICD-10-CM

## 2019-03-14 DIAGNOSIS — I10 Essential (primary) hypertension: Secondary | ICD-10-CM

## 2019-03-14 LAB — CMP (CANCER CENTER ONLY)
ALT: 23 U/L (ref 0–44)
AST: 29 U/L (ref 15–41)
Albumin: 3.2 g/dL — ABNORMAL LOW (ref 3.5–5.0)
Alkaline Phosphatase: 149 U/L — ABNORMAL HIGH (ref 38–126)
Anion gap: 10 (ref 5–15)
BUN: 17 mg/dL (ref 8–23)
CO2: 26 mmol/L (ref 22–32)
Calcium: 9.5 mg/dL (ref 8.9–10.3)
Chloride: 102 mmol/L (ref 98–111)
Creatinine: 1.25 mg/dL — ABNORMAL HIGH (ref 0.61–1.24)
GFR, Est AFR Am: >60 mL/min (ref >60)
GFR, Estimated: 60 mL/min — ABNORMAL LOW (ref >60)
Glucose, Bld: 124 mg/dL — ABNORMAL HIGH (ref 70–99)
Potassium: 3.4 mmol/L — ABNORMAL LOW (ref 3.5–5.1)
Sodium: 138 mmol/L (ref 135–145)
Total Bilirubin: 0.6 mg/dL (ref 0.3–1.2)
Total Protein: 7.4 g/dL (ref 6.5–8.1)

## 2019-03-14 LAB — CBC WITH DIFFERENTIAL (CANCER CENTER ONLY)
Abs Immature Granulocytes: 0.04 10*3/uL (ref 0.00–0.07)
Basophils Absolute: 0.1 10*3/uL (ref 0.0–0.1)
Basophils Relative: 1 %
Eosinophils Absolute: 0.1 10*3/uL (ref 0.0–0.5)
Eosinophils Relative: 2 %
HCT: 34.2 % — ABNORMAL LOW (ref 39.0–52.0)
Hemoglobin: 11.5 g/dL — ABNORMAL LOW (ref 13.0–17.0)
Immature Granulocytes: 1 %
Lymphocytes Relative: 14 %
Lymphs Abs: 1.2 10*3/uL (ref 0.7–4.0)
MCH: 33.9 pg (ref 26.0–34.0)
MCHC: 33.6 g/dL (ref 30.0–36.0)
MCV: 100.9 fL — ABNORMAL HIGH (ref 80.0–100.0)
Monocytes Absolute: 0.9 10*3/uL (ref 0.1–1.0)
Monocytes Relative: 10 %
Neutro Abs: 6.4 10*3/uL (ref 1.7–7.7)
Neutrophils Relative %: 72 %
Platelet Count: 99 10*3/uL — ABNORMAL LOW (ref 150–400)
RBC: 3.39 MIL/uL — ABNORMAL LOW (ref 4.22–5.81)
RDW: 16.1 % — ABNORMAL HIGH (ref 11.5–15.5)
WBC Count: 8.7 10*3/uL (ref 4.0–10.5)
nRBC: 0 % (ref 0.0–0.2)

## 2019-03-14 LAB — CEA (IN HOUSE-CHCC): CEA (CHCC-In House): 4.15 ng/mL (ref 0.00–5.00)

## 2019-03-14 MED ORDER — ONDANSETRON HCL 8 MG PO TABS
ORAL_TABLET | ORAL | Status: AC
  Filled 2019-03-14: qty 1

## 2019-03-14 MED ORDER — SODIUM CHLORIDE 0.9% FLUSH
10.0000 mL | INTRAVENOUS | Status: DC | PRN
  Administered 2019-03-14: 10 mL
  Filled 2019-03-14: qty 10

## 2019-03-14 MED ORDER — SODIUM CHLORIDE 0.9 % IV SOLN
1200.0000 mg/m2 | INTRAVENOUS | Status: DC
  Administered 2019-03-14: 3050 mg via INTRAVENOUS
  Filled 2019-03-14: qty 61

## 2019-03-14 MED ORDER — ONDANSETRON HCL 8 MG PO TABS
8.0000 mg | ORAL_TABLET | Freq: Once | ORAL | Status: AC
  Administered 2019-03-14: 8 mg via ORAL

## 2019-03-14 MED ORDER — DEXAMETHASONE SODIUM PHOSPHATE 10 MG/ML IJ SOLN
INTRAMUSCULAR | Status: AC
  Filled 2019-03-14: qty 1

## 2019-03-14 MED ORDER — PALONOSETRON HCL INJECTION 0.25 MG/5ML
INTRAVENOUS | Status: AC
  Filled 2019-03-14: qty 5

## 2019-03-14 MED ORDER — FLUOROURACIL CHEMO INJECTION 2.5 GM/50ML: 400.0000 mg/m2 | Freq: Once | INTRAVENOUS | Status: DC

## 2019-03-14 MED ORDER — DEXTROSE 5 % IV SOLN
Freq: Once | INTRAVENOUS | Status: AC
  Administered 2019-03-14: 11:00:00 via INTRAVENOUS
  Filled 2019-03-14: qty 250

## 2019-03-14 MED ORDER — LEUCOVORIN CALCIUM INJECTION 350 MG: 400.0000 mg/m2 | Freq: Once | INTRAVENOUS | Status: DC

## 2019-03-14 NOTE — Progress Notes (Signed)
Per [name], NP ok to treat with platelet of 99.

## 2019-03-14 NOTE — Progress Notes (Signed)
Spoke w/ Dr. Benay Spice - no 5-FU bolus or leucovorin today. Decrease 5-FU dose infusion by 50%. This change will be for today and future cycles.   Demetrius Charity, PharmD, [facility] Pharmacist Pharmacy Phone: [phone] 03/14/2019

## 2019-03-14 NOTE — Patient Instructions (Addendum)
[facility] Discharge Instructions for Patients Receiving Chemotherapy  Today you received the following chemotherapy agents:  5FU Pump.   To help prevent nausea and vomiting after your treatment, we encourage you to take your nausea medication as directed.   If you develop nausea and vomiting that is not controlled by your nausea medication, call the clinic.   BELOW ARE SYMPTOMS THAT SHOULD BE REPORTED IMMEDIATELY:  *FEVER GREATER THAN 100.5 F  *CHILLS WITH OR WITHOUT FEVER  NAUSEA AND VOMITING THAT IS NOT CONTROLLED WITH YOUR NAUSEA MEDICATION  *UNUSUAL SHORTNESS OF BREATH  *UNUSUAL BRUISING OR BLEEDING  TENDERNESS IN MOUTH AND THROAT WITH OR WITHOUT PRESENCE OF ULCERS  *URINARY PROBLEMS  *BOWEL PROBLEMS  UNUSUAL RASH Items with * indicate a potential emergency and should be followed up as soon as possible.  Feel free to call the clinic should you have any questions or concerns. The clinic phone number is [phone].  Please show the [facility] at check-in to the Emergency Department and triage nurse.

## 2019-03-14 NOTE — Telephone Encounter (Signed)
No los per 4/27. °

## 2019-03-14 NOTE — Progress Notes (Addendum)
[place] OFFICE PROGRESS NOTE   Diagnosis: Colon cancer  INTERVAL HISTORY:   Jeremy Stevenson returns as scheduled.  He completed cycle 10 FOLFOX 02/28/2019.  Oxaliplatin was held due to neuropathy and thrombocytopenia.  He had mild nausea.  No vomiting.  No mouth sores.  He has been having intermittent diarrhea over the past 2 weeks.  He has had 3 watery stools a day for the past 2 days.  He takes Imodium as needed.  Fingertips with persistent numbness.  He dropped his fork 3 times during dinner last night.  He notes watery eyes.  Objective:  Vital signs in last 24 hours:  Blood pressure (!) 146/94, pulse 67, temperature 98.2 F (36.8 C), temperature source Oral, resp. rate 18, height _0  (1.88 m), weight 269 lb 3.2 oz (122.1 kg), SpO2 99 %.    HEENT: No thrush or ulcers. Vascular: No leg edema. Neuro: Vibratory sense mildly decreased over the fingertips per tuning fork exam. Skin: Palms without erythema. Port-A-Cath without erythema.   Lab Results:  Lab Results  Component Value Date   WBC 8.7 03/14/2019   HGB 11.5 (L) 03/14/2019   HCT 34.2 (L) 03/14/2019   MCV 100.9 (H) 03/14/2019   PLT 99 (L) 03/14/2019   NEUTROABS 6.4 03/14/2019    Imaging:  No results found.  Medications: I have reviewed the patient's current medications.  Assessment/Plan: 1. Adenocarcinoma of the descending colon, stage II (T3 N0), status post a left colectomy 01/20/2017 ? MSI-stable, no loss of mismatch repair protein expression ? Elevated preoperative CEA ? Incomplete preoperative colonoscopy ? Colonoscopy 08/07/2017-multiple polyps removed including tubular adenomas ? CT abdomen/pelvis 02/05/2018-no evidence of metastatic disease. ? Elevated CEA June 2019 ? CTs 06/29/2018-no evidence of metastatic disease, stable mild bilateral iliac adenopathy ? PET scan 6/57/8469-GEXBMWUX hypermetabolic central right liver metastasis;asymmetric hypermetabolism right palatine tonsil with  associated mild asymmetric soft tissue fullness on CT images ? MRI liver 08/25/2018-2.6 cm mass in the central right liver consistent with metastasis, no other evidence of metastatic disease ? Ablation and biopsy of right liver lesion 09/29/2018-pathology revealed adenocarcinoma; foundation 1-KRAS G12V, NRAS wildtype, microsatellite stable, tumor mutational burden 1 ? Cycle 1 FOLFOX 10/25/2018 ? Cycle 2 FOLFOX 11/09/2018 ? Cycle 3 FOLFOX 11/23/2018 ? Cycle 4 FOLFOX 12/07/2018 ? Cycle 5 FOLFOX 12/21/2018 (oxaliplatin dose reduced secondary to thrombocytopenia) ? Cycle 6 FOLFOX 01/04/2019 ? Cycle 7 FOLFOX 01/18/2019 ? Cycle 8 FOLFOX 02/01/2019 ? Cycle 9 FOLFOX 02/15/2019 ? MRI abdomen 02/24/2019- ablation site in the liver without residual tumor identified.  Mild intrahepatic biliary dilatation distal to the ablation site.  Diffuse hepatic steatosis.  No new liver lesions identified. ? Cycle 10 FOLFOX 02/28/2019 (oxaliplatin held due to neuropathy and thrombocytopenia) ? Cycle 11 FOLFOX 03/14/2019 (oxaliplatin eliminated from the regimen due to neuropathy, 5-fluorouracil dose adjusted due to diarrhea and tearing)  2. Enlargement of the right testicle-hydrocele-Testicular ultrasound 02/26/2017-negative for mass, small bilateral hydroceles  3. Hypertension  4. Depression  5. Family history of breast and ovarian cancer, negative genetic testing  6.History of left leg edema, pain.  7.Pigmented lesion right lower leg-refer to dermatology 06/30/2018  8. Port-A-Cath placement 10/13/2018  9.Thrombocytopenia secondary to chemotherapy  10.Oxaliplatin neuropathy  Disposition: Jeremy Stevenson appears stable.  He has completed 10 cycles of FOLFOX.  Plan to proceed with cycle 11 today as scheduled.  Oxaliplatin is being eliminated from the regimen due to neuropathy.  5-fluorouracil dose will be adjusted due to diarrhea and tearing.  We reviewed the  CBC from today.  Counts are adequate for  treatment.  He will return for lab, follow-up and cycle 12 FOLFOX in 2 weeks.  He will contact the office in the interim with any problems.  Patient seen with Dr. Benay Spice.  25 minutes were spent face-to-face at today's visit with the majority of that time involved in counseling/coordination of care.    [name] ANP/GNP-BC   03/14/2019  11:03 AM  This was a shared visit with [name].  Jeremy Stevenson has developed progressive neuropathy symptoms.  Oxaliplatin will remain on hold.  We will dose reduce the 5-fluorouracil secondary to increased tearing and diarrhea.  Jeremy Manson, MD

## 2019-03-16 ENCOUNTER — Inpatient Hospital Stay: Payer: Medicare Other

## 2019-03-16 ENCOUNTER — Other Ambulatory Visit: Payer: Self-pay

## 2019-03-16 VITALS — BP 138/84 | HR 62 | Temp 98.3°F | Resp 18

## 2019-03-16 DIAGNOSIS — Z5111 Encounter for antineoplastic chemotherapy: Secondary | ICD-10-CM | POA: Diagnosis not present

## 2019-03-16 DIAGNOSIS — I10 Essential (primary) hypertension: Secondary | ICD-10-CM | POA: Diagnosis not present

## 2019-03-16 DIAGNOSIS — D6959 Other secondary thrombocytopenia: Secondary | ICD-10-CM | POA: Diagnosis not present

## 2019-03-16 DIAGNOSIS — C186 Malignant neoplasm of descending colon: Secondary | ICD-10-CM

## 2019-03-16 DIAGNOSIS — F329 Major depressive disorder, single episode, unspecified: Secondary | ICD-10-CM | POA: Diagnosis not present

## 2019-03-16 DIAGNOSIS — C787 Secondary malignant neoplasm of liver and intrahepatic bile duct: Secondary | ICD-10-CM | POA: Diagnosis not present

## 2019-03-16 MED ORDER — SODIUM CHLORIDE 0.9% FLUSH
10.0000 mL | INTRAVENOUS | Status: DC | PRN
  Administered 2019-03-16: 10 mL
  Filled 2019-03-16: qty 10

## 2019-03-16 MED ORDER — HEPARIN SOD (PORK) LOCK FLUSH 100 UNIT/ML IV SOLN
500.0000 [IU] | Freq: Once | INTRAVENOUS | Status: AC | PRN
  Administered 2019-03-16: 500 [IU]
  Filled 2019-03-16: qty 5

## 2019-03-27 ENCOUNTER — Other Ambulatory Visit: Payer: Self-pay | Admitting: Oncology

## 2019-03-28 ENCOUNTER — Other Ambulatory Visit: Payer: Self-pay

## 2019-03-28 ENCOUNTER — Inpatient Hospital Stay: Payer: Medicare Other

## 2019-03-28 ENCOUNTER — Encounter: Payer: Self-pay | Admitting: Nurse Practitioner

## 2019-03-28 ENCOUNTER — Telehealth: Payer: Self-pay | Admitting: Nurse Practitioner

## 2019-03-28 ENCOUNTER — Inpatient Hospital Stay: Payer: Medicare Other | Attending: Oncology

## 2019-03-28 ENCOUNTER — Other Ambulatory Visit: Payer: Self-pay | Admitting: Nurse Practitioner

## 2019-03-28 ENCOUNTER — Inpatient Hospital Stay (HOSPITAL_BASED_OUTPATIENT_CLINIC_OR_DEPARTMENT_OTHER): Payer: Medicare Other | Admitting: Nurse Practitioner

## 2019-03-28 VITALS — BP 132/81 | HR 64 | Temp 98.3°F | Resp 19 | Ht 74.0 in | Wt 268.6 lb

## 2019-03-28 DIAGNOSIS — C186 Malignant neoplasm of descending colon: Secondary | ICD-10-CM

## 2019-03-28 DIAGNOSIS — Z79899 Other long term (current) drug therapy: Secondary | ICD-10-CM

## 2019-03-28 DIAGNOSIS — D6959 Other secondary thrombocytopenia: Secondary | ICD-10-CM | POA: Diagnosis not present

## 2019-03-28 DIAGNOSIS — R197 Diarrhea, unspecified: Secondary | ICD-10-CM | POA: Diagnosis not present

## 2019-03-28 DIAGNOSIS — C787 Secondary malignant neoplasm of liver and intrahepatic bile duct: Secondary | ICD-10-CM

## 2019-03-28 DIAGNOSIS — I10 Essential (primary) hypertension: Secondary | ICD-10-CM

## 2019-03-28 DIAGNOSIS — Z95828 Presence of other vascular implants and grafts: Secondary | ICD-10-CM

## 2019-03-28 DIAGNOSIS — Z803 Family history of malignant neoplasm of breast: Secondary | ICD-10-CM

## 2019-03-28 DIAGNOSIS — Z8041 Family history of malignant neoplasm of ovary: Secondary | ICD-10-CM | POA: Diagnosis not present

## 2019-03-28 DIAGNOSIS — Z5111 Encounter for antineoplastic chemotherapy: Secondary | ICD-10-CM | POA: Insufficient documentation

## 2019-03-28 DIAGNOSIS — G620 Drug-induced polyneuropathy: Secondary | ICD-10-CM | POA: Diagnosis not present

## 2019-03-28 DIAGNOSIS — F329 Major depressive disorder, single episode, unspecified: Secondary | ICD-10-CM

## 2019-03-28 DIAGNOSIS — Z9049 Acquired absence of other specified parts of digestive tract: Secondary | ICD-10-CM | POA: Diagnosis not present

## 2019-03-28 LAB — CMP (CANCER CENTER ONLY)
ALT: 22 U/L (ref 0–44)
AST: 25 U/L (ref 15–41)
Albumin: 3.2 g/dL — ABNORMAL LOW (ref 3.5–5.0)
Alkaline Phosphatase: 153 U/L — ABNORMAL HIGH (ref 38–126)
Anion gap: 10 (ref 5–15)
BUN: 15 mg/dL (ref 8–23)
CO2: 29 mmol/L (ref 22–32)
Calcium: 8.8 mg/dL — ABNORMAL LOW (ref 8.9–10.3)
Chloride: 99 mmol/L (ref 98–111)
Creatinine: 1.11 mg/dL (ref 0.61–1.24)
GFR, Est AFR Am: >60 mL/min (ref >60)
GFR, Estimated: >60 mL/min (ref >60)
Glucose, Bld: 119 mg/dL — ABNORMAL HIGH (ref 70–99)
Potassium: 3.5 mmol/L (ref 3.5–5.1)
Sodium: 138 mmol/L (ref 135–145)
Total Bilirubin: 0.7 mg/dL (ref 0.3–1.2)
Total Protein: 7.3 g/dL (ref 6.5–8.1)

## 2019-03-28 LAB — CBC WITH DIFFERENTIAL (CANCER CENTER ONLY)
Abs Immature Granulocytes: 0.24 10*3/uL — ABNORMAL HIGH (ref 0.00–0.07)
Basophils Absolute: 0.1 10*3/uL (ref 0.0–0.1)
Basophils Relative: 1 %
Eosinophils Absolute: 0.3 10*3/uL (ref 0.0–0.5)
Eosinophils Relative: 3 %
HCT: 35.8 % — ABNORMAL LOW (ref 39.0–52.0)
Hemoglobin: 11.7 g/dL — ABNORMAL LOW (ref 13.0–17.0)
Immature Granulocytes: 2 %
Lymphocytes Relative: 12 %
Lymphs Abs: 1.5 10*3/uL (ref 0.7–4.0)
MCH: 33.6 pg (ref 26.0–34.0)
MCHC: 32.7 g/dL (ref 30.0–36.0)
MCV: 102.9 fL — ABNORMAL HIGH (ref 80.0–100.0)
Monocytes Absolute: 1.1 10*3/uL — ABNORMAL HIGH (ref 0.1–1.0)
Monocytes Relative: 9 %
Neutro Abs: 9.3 10*3/uL — ABNORMAL HIGH (ref 1.7–7.7)
Neutrophils Relative %: 73 %
Platelet Count: 137 10*3/uL — ABNORMAL LOW (ref 150–400)
RBC: 3.48 MIL/uL — ABNORMAL LOW (ref 4.22–5.81)
RDW: 14.9 % (ref 11.5–15.5)
WBC Count: 12.6 10*3/uL — ABNORMAL HIGH (ref 4.0–10.5)
nRBC: 0 % (ref 0.0–0.2)

## 2019-03-28 MED ORDER — HEPARIN SOD (PORK) LOCK FLUSH 100 UNIT/ML IV SOLN
500.0000 [IU] | Freq: Once | INTRAVENOUS | Status: DC | PRN
  Filled 2019-03-28: qty 5

## 2019-03-28 MED ORDER — DEXTROSE 5 % IV SOLN
Freq: Once | INTRAVENOUS | Status: AC
  Administered 2019-03-28: 11:00:00 via INTRAVENOUS
  Filled 2019-03-28: qty 250

## 2019-03-28 MED ORDER — LORAZEPAM 0.5 MG PO TABS: 0.5000 mg | ORAL_TABLET | Freq: Two times a day (BID) | ORAL | 0 refills | Status: DC | PRN

## 2019-03-28 MED ORDER — ONDANSETRON HCL 8 MG PO TABS
8.0000 mg | ORAL_TABLET | Freq: Once | ORAL | Status: AC
  Administered 2019-03-28: 11:00:00 8 mg via ORAL

## 2019-03-28 MED ORDER — SODIUM CHLORIDE 0.9% FLUSH
10.0000 mL | INTRAVENOUS | Status: DC | PRN
  Administered 2019-03-28: 10 mL
  Filled 2019-03-28: qty 10

## 2019-03-28 MED ORDER — SODIUM CHLORIDE 0.9 % IV SOLN
1200.0000 mg/m2 | INTRAVENOUS | Status: DC
  Administered 2019-03-28: 3050 mg via INTRAVENOUS
  Filled 2019-03-28: qty 61

## 2019-03-28 MED ORDER — SODIUM CHLORIDE 0.9% FLUSH
10.0000 mL | INTRAVENOUS | Status: DC | PRN
  Filled 2019-03-28: qty 10

## 2019-03-28 MED ORDER — ONDANSETRON HCL 8 MG PO TABS
ORAL_TABLET | ORAL | Status: AC
  Filled 2019-03-28: qty 1

## 2019-03-28 NOTE — Patient Instructions (Signed)
[facility] Discharge Instructions for Patients Receiving Chemotherapy  Today you received the following chemotherapy agents:  5FU Pump.   To help prevent nausea and vomiting after your treatment, we encourage you to take your nausea medication as directed.   If you develop nausea and vomiting that is not controlled by your nausea medication, call the clinic.   BELOW ARE SYMPTOMS THAT SHOULD BE REPORTED IMMEDIATELY:  *FEVER GREATER THAN 100.5 F  *CHILLS WITH OR WITHOUT FEVER  NAUSEA AND VOMITING THAT IS NOT CONTROLLED WITH YOUR NAUSEA MEDICATION  *UNUSUAL SHORTNESS OF BREATH  *UNUSUAL BRUISING OR BLEEDING  TENDERNESS IN MOUTH AND THROAT WITH OR WITHOUT PRESENCE OF ULCERS  *URINARY PROBLEMS  *BOWEL PROBLEMS  UNUSUAL RASH Items with * indicate a potential emergency and should be followed up as soon as possible.  Feel free to call the clinic should you have any questions or concerns. The clinic phone number is [phone].  Please show the [place] at check-in to the Emergency Department and triage nurse.

## 2019-03-28 NOTE — Telephone Encounter (Signed)
Scheduled appt per 5/11 los.  Left a VM of scheduled appt.

## 2019-03-28 NOTE — Progress Notes (Signed)
[place] OFFICE PROGRESS NOTE   Diagnosis: Colon cancer  INTERVAL HISTORY:   Jeremy Stevenson returns as scheduled.  He completed cycle 11 FOLFOX 03/14/2019.  Oxaliplatin was held due to progressive neuropathy symptoms.  5-fluorouracil was dose reduced due to tearing and diarrhea.  He denies nausea/vomiting.  No mouth sores.  He continues to have intermittent diarrhea.  He takes antidiarrheal medication as needed.  He reports persistent numbness in the hands and feet.  Numbness in the hands is interfering with activity.  He continues to note tearing.  Objective:  Vital signs in last 24 hours:  Blood pressure 132/81, pulse 64, temperature 98.3 F (36.8 C), temperature source Oral, resp. rate 19, height '6\' 2"'  (1.88 m), weight 268 lb 9.6 oz (121.8 kg), SpO2 98 %.    HEENT: Mild conjunctival erythema. Vascular: No leg edema. Neuro: Vibratory sense moderately decreased over the fingertips per tuning fork exam. Skin: Palms without erythema. Port-A-Cath without erythema.   Lab Results:  Lab Results  Component Value Date   WBC 12.6 (H) 03/28/2019   HGB 11.7 (L) 03/28/2019   HCT 35.8 (L) 03/28/2019   MCV 102.9 (H) 03/28/2019   PLT 137 (L) 03/28/2019   NEUTROABS 9.3 (H) 03/28/2019    Imaging:  No results found.  Medications: I have reviewed the patient's current medications.  Assessment/Plan: 1. Adenocarcinoma of the descending colon, stage II (T3 N0), status post a left colectomy 01/20/2017 ? MSI-stable, no loss of mismatch repair protein expression ? Elevated preoperative CEA ? Incomplete preoperative colonoscopy ? Colonoscopy 08/07/2017-multiple polyps removed including tubular adenomas ? CT abdomen/pelvis 02/05/2018-no evidence of metastatic disease. ? Elevated CEA June 2019 ? CTs 06/29/2018-no evidence of metastatic disease, stable mild bilateral iliac adenopathy ? PET scan 3/81/0175-ZWCHENID hypermetabolic central right liver metastasis;asymmetric  hypermetabolism right palatine tonsil with associated mild asymmetric soft tissue fullness on CT images ? MRI liver 08/25/2018-2.6 cm mass in the central right liver consistent with metastasis, no other evidence of metastatic disease ? Ablation and biopsy of right liver lesion 09/29/2018-pathology revealed adenocarcinoma; foundation 1-KRAS G12V, NRAS wildtype, microsatellite stable, tumor mutational burden 1 ? Cycle 1 FOLFOX 10/25/2018 ? Cycle 2 FOLFOX 11/09/2018 ? Cycle 3 FOLFOX 11/23/2018 ? Cycle 4 FOLFOX 12/07/2018 ? Cycle 5 FOLFOX 12/21/2018 (oxaliplatin dose reduced secondary to thrombocytopenia) ? Cycle 6 FOLFOX 01/04/2019 ? Cycle 7 FOLFOX 01/18/2019 ? Cycle 8 FOLFOX 02/01/2019 ? Cycle 9 FOLFOX 02/15/2019 ? MRI abdomen 02/24/2019- ablation site in the liver without residual tumor identified. Mild intrahepatic biliary dilatation distal to the ablation site. Diffuse hepatic steatosis. No new liver lesions identified. ? Cycle 10 FOLFOX 02/28/2019 (oxaliplatin held due to neuropathy and thrombocytopenia) ? Cycle 11 FOLFOX 03/14/2019 (oxaliplatin eliminated from the regimen due to neuropathy, 5-fluorouracil dose adjusted due to diarrhea and tearing) ? Cycle 12 FOLFOX 03/28/2019 (no oxaliplatin, 5-fluorouracil as per treatment 03/14/2019)  2. Enlargement of the right testicle-hydrocele-Testicular ultrasound 02/26/2017-negative for mass, small bilateral hydroceles  3. Hypertension  4. Depression  5. Family history of breast and ovarian cancer, negative genetic testing  6.History of left leg edema, pain.  7.Pigmented lesion right lower leg-refer to dermatology 06/30/2018  8. Port-A-Cath placement 10/13/2018  9.Thrombocytopenia secondary to chemotherapy  10.Oxaliplatin neuropathy   Disposition: Jeremy Stevenson appears stable.  He has completed 11 cycles of FOLFOX.  Oxaliplatin has been eliminated from the regimen.  5-fluorouracil was dose reduced with cycle 11 due to diarrhea and  tearing.  Plan to proceed with cycle 12 today as scheduled.  We  reviewed the CBC from today.  Counts are adequate for treatment.  He will return for lab, Port-A-Cath flush and follow-up in 1 month.  He will contact the office in the interim with any problems.  Plan reviewed with Dr. Benay Spice.    [name] ANP/GNP-BC   03/28/2019  9:55 AM

## 2019-03-30 ENCOUNTER — Inpatient Hospital Stay: Payer: Medicare Other

## 2019-03-30 ENCOUNTER — Other Ambulatory Visit: Payer: Self-pay

## 2019-03-30 VITALS — BP 128/79 | HR 68 | Temp 98.2°F | Resp 18

## 2019-03-30 DIAGNOSIS — Z95828 Presence of other vascular implants and grafts: Secondary | ICD-10-CM

## 2019-03-30 DIAGNOSIS — C186 Malignant neoplasm of descending colon: Secondary | ICD-10-CM | POA: Diagnosis not present

## 2019-03-30 DIAGNOSIS — Z5111 Encounter for antineoplastic chemotherapy: Secondary | ICD-10-CM | POA: Diagnosis not present

## 2019-03-30 DIAGNOSIS — C787 Secondary malignant neoplasm of liver and intrahepatic bile duct: Secondary | ICD-10-CM | POA: Diagnosis not present

## 2019-03-30 MED ORDER — HEPARIN SOD (PORK) LOCK FLUSH 100 UNIT/ML IV SOLN
500.0000 [IU] | Freq: Once | INTRAVENOUS | Status: AC | PRN
  Administered 2019-03-30: 500 [IU]
  Filled 2019-03-30: qty 5

## 2019-03-30 MED ORDER — SODIUM CHLORIDE 0.9% FLUSH
10.0000 mL | INTRAVENOUS | Status: DC | PRN
  Administered 2019-03-30: 10 mL
  Filled 2019-03-30: qty 10

## 2019-04-26 ENCOUNTER — Inpatient Hospital Stay: Payer: Medicare Other | Attending: Oncology

## 2019-04-26 ENCOUNTER — Inpatient Hospital Stay (HOSPITAL_BASED_OUTPATIENT_CLINIC_OR_DEPARTMENT_OTHER): Payer: Medicare Other | Admitting: Oncology

## 2019-04-26 ENCOUNTER — Inpatient Hospital Stay: Payer: Medicare Other

## 2019-04-26 ENCOUNTER — Telehealth: Payer: Self-pay | Admitting: Oncology

## 2019-04-26 ENCOUNTER — Other Ambulatory Visit: Payer: Self-pay

## 2019-04-26 VITALS — BP 142/88 | HR 63 | Temp 99.1°F | Resp 18 | Ht 74.0 in | Wt 269.4 lb

## 2019-04-26 DIAGNOSIS — C787 Secondary malignant neoplasm of liver and intrahepatic bile duct: Secondary | ICD-10-CM

## 2019-04-26 DIAGNOSIS — F329 Major depressive disorder, single episode, unspecified: Secondary | ICD-10-CM | POA: Insufficient documentation

## 2019-04-26 DIAGNOSIS — I10 Essential (primary) hypertension: Secondary | ICD-10-CM

## 2019-04-26 DIAGNOSIS — D6959 Other secondary thrombocytopenia: Secondary | ICD-10-CM | POA: Diagnosis not present

## 2019-04-26 DIAGNOSIS — Z95828 Presence of other vascular implants and grafts: Secondary | ICD-10-CM

## 2019-04-26 DIAGNOSIS — G620 Drug-induced polyneuropathy: Secondary | ICD-10-CM | POA: Insufficient documentation

## 2019-04-26 DIAGNOSIS — C186 Malignant neoplasm of descending colon: Secondary | ICD-10-CM

## 2019-04-26 LAB — CBC WITH DIFFERENTIAL (CANCER CENTER ONLY)
Abs Immature Granulocytes: 0.11 10*3/uL — ABNORMAL HIGH (ref 0.00–0.07)
Basophils Absolute: 0.1 10*3/uL (ref 0.0–0.1)
Basophils Relative: 1 %
Eosinophils Absolute: 0.5 10*3/uL (ref 0.0–0.5)
Eosinophils Relative: 4 %
HCT: 35.5 % — ABNORMAL LOW (ref 39.0–52.0)
Hemoglobin: 11.6 g/dL — ABNORMAL LOW (ref 13.0–17.0)
Immature Granulocytes: 1 %
Lymphocytes Relative: 15 %
Lymphs Abs: 1.6 10*3/uL (ref 0.7–4.0)
MCH: 32.3 pg (ref 26.0–34.0)
MCHC: 32.7 g/dL (ref 30.0–36.0)
MCV: 98.9 fL (ref 80.0–100.0)
Monocytes Absolute: 0.8 10*3/uL (ref 0.1–1.0)
Monocytes Relative: 7 %
Neutro Abs: 7.9 10*3/uL — ABNORMAL HIGH (ref 1.7–7.7)
Neutrophils Relative %: 72 %
Platelet Count: 135 10*3/uL — ABNORMAL LOW (ref 150–400)
RBC: 3.59 MIL/uL — ABNORMAL LOW (ref 4.22–5.81)
RDW: 13.2 % (ref 11.5–15.5)
WBC Count: 10.9 10*3/uL — ABNORMAL HIGH (ref 4.0–10.5)
nRBC: 0 % (ref 0.0–0.2)

## 2019-04-26 LAB — CMP (CANCER CENTER ONLY)
ALT: 19 U/L (ref 0–44)
AST: 24 U/L (ref 15–41)
Albumin: 3.4 g/dL — ABNORMAL LOW (ref 3.5–5.0)
Alkaline Phosphatase: 135 U/L — ABNORMAL HIGH (ref 38–126)
Anion gap: 10 (ref 5–15)
BUN: 16 mg/dL (ref 8–23)
CO2: 29 mmol/L (ref 22–32)
Calcium: 9.6 mg/dL (ref 8.9–10.3)
Chloride: 100 mmol/L (ref 98–111)
Creatinine: 1.04 mg/dL (ref 0.61–1.24)
GFR, Est AFR Am: >60 mL/min (ref >60)
GFR, Est Non Af Am: >60 mL/min (ref >60)
Glucose, Bld: 115 mg/dL — ABNORMAL HIGH (ref 70–99)
Potassium: 4.1 mmol/L (ref 3.5–5.1)
Sodium: 139 mmol/L (ref 135–145)
Total Bilirubin: 0.6 mg/dL (ref 0.3–1.2)
Total Protein: 7.5 g/dL (ref 6.5–8.1)

## 2019-04-26 LAB — CEA (IN HOUSE-CHCC): CEA (CHCC-In House): 2.26 ng/mL (ref 0.00–5.00)

## 2019-04-26 MED ORDER — SODIUM CHLORIDE 0.9% FLUSH
10.0000 mL | INTRAVENOUS | Status: DC | PRN
  Administered 2019-04-26: 10 mL
  Filled 2019-04-26: qty 10

## 2019-04-26 MED ORDER — METOPROLOL TARTRATE 100 MG PO TABS: 100.0000 mg | ORAL_TABLET | Freq: Two times a day (BID) | ORAL | 1 refills | Status: DC

## 2019-04-26 MED ORDER — HEPARIN SOD (PORK) LOCK FLUSH 100 UNIT/ML IV SOLN
500.0000 [IU] | Freq: Once | INTRAVENOUS | Status: AC | PRN
  Administered 2019-04-26: 500 [IU]
  Filled 2019-04-26: qty 5

## 2019-04-26 NOTE — Patient Instructions (Signed)

## 2019-04-26 NOTE — Telephone Encounter (Signed)
Scheduled appt per 6/9 los.  A calendar will be mailed out. °

## 2019-04-26 NOTE — Progress Notes (Signed)
[facility] OFFICE PROGRESS NOTE   Diagnosis: Colon cancer  INTERVAL HISTORY:   Jeremy Stevenson completed a final cycle of 5-fluorouracil on 03/28/2019.  He reports feeling well.  He has developed numbness in the fingers.  He has mild difficulty buttoning.  No numbness in the feet.  No tingling or pain.  No other complaint.  Objective:  Vital signs in last 24 hours:  Blood pressure (!) 142/88, pulse 63, temperature 99.1 F (37.3 C), temperature source Oral, resp. rate 18, height '6\' 2"'  (1.88 m), weight 269 lb 6.4 oz (122.2 kg), SpO2 99 %.    HEENT: Neck without mass Lymphatics: No cervical, supraclavicular, axillary, or inguinal nodes GI: No hepatosplenomegaly, no mass, nontender Vascular: Varicosities of the left greater than right lower leg, no edema   Portacath/PICC-without erythema  Lab Results:  Lab Results  Component Value Date   WBC 10.9 (H) 04/26/2019   HGB 11.6 (L) 04/26/2019   HCT 35.5 (L) 04/26/2019   MCV 98.9 04/26/2019   PLT 135 (L) 04/26/2019   NEUTROABS 7.9 (H) 04/26/2019    CMP  Lab Results  Component Value Date   NA 139 04/26/2019   K 4.1 04/26/2019   CL 100 04/26/2019   CO2 29 04/26/2019   GLUCOSE 115 (H) 04/26/2019   BUN 16 04/26/2019   CREATININE 1.04 04/26/2019   CALCIUM 9.6 04/26/2019   PROT 7.5 04/26/2019   ALBUMIN 3.4 (L) 04/26/2019   AST 24 04/26/2019   ALT 19 04/26/2019   ALKPHOS 135 (H) 04/26/2019   BILITOT 0.6 04/26/2019   GFRNONAA >60 04/26/2019   GFRAA >60 04/26/2019    Lab Results  Component Value Date   CEA1 2.26 04/26/2019     Medications: I have reviewed the patient's current medications.   Assessment/Plan: 1. Adenocarcinoma of the descending colon, stage II (T3 N0), status post a left colectomy 01/20/2017 ? MSI-stable, no loss of mismatch repair protein expression ? Elevated preoperative CEA ? Incomplete preoperative colonoscopy ? Colonoscopy 08/07/2017-multiple polyps removed including tubular  adenomas ? CT abdomen/pelvis 02/05/2018-no evidence of metastatic disease. ? Elevated CEA June 2019 ? CTs 06/29/2018-no evidence of metastatic disease, stable mild bilateral iliac adenopathy ? PET scan 3/34/3568-SHUOHFGB hypermetabolic central right liver metastasis;asymmetric hypermetabolism right palatine tonsil with associated mild asymmetric soft tissue fullness on CT images ? MRI liver 08/25/2018-2.6 cm mass in the central right liver consistent with metastasis, no other evidence of metastatic disease ? Ablation and biopsy of right liver lesion 09/29/2018-pathology revealed adenocarcinoma; foundation 1-KRAS G12V, NRAS wildtype, microsatellite stable, tumor mutational burden 1 ? Cycle 1 FOLFOX 10/25/2018 ? Cycle 2 FOLFOX 11/09/2018 ? Cycle 3 FOLFOX 11/23/2018 ? Cycle 4 FOLFOX 12/07/2018 ? Cycle 5 FOLFOX 12/21/2018 (oxaliplatin dose reduced secondary to thrombocytopenia) ? Cycle 6 FOLFOX 01/04/2019 ? Cycle 7 FOLFOX 01/18/2019 ? Cycle 8 FOLFOX 02/01/2019 ? Cycle 9 FOLFOX 02/15/2019 ? MRI abdomen 02/24/2019- ablation site in the liver without residual tumor identified. Mild intrahepatic biliary dilatation distal to the ablation site. Diffuse hepatic steatosis. No new liver lesions identified. ? Cycle 10 FOLFOX 02/28/2019 (oxaliplatin held due to neuropathy and thrombocytopenia) ? Cycle 11 FOLFOX 03/14/2019 (oxaliplatin eliminated from the regimen due to neuropathy, 5-fluorouracil dose adjusted due to diarrhea and tearing) ? Cycle 12 FOLFOX 03/28/2019 (no oxaliplatin, 5-fluorouracil as per treatment 03/14/2019)  2. Enlargement of the right testicle-hydrocele-Testicular ultrasound 02/26/2017-negative for mass, small bilateral hydroceles  3. Hypertension  4. Depression  5. Family history of breast and ovarian cancer, negative genetic testing  6.History  of left leg edema, pain.  7.Pigmented lesion right lower leg-refer to dermatology 06/30/2018  8. 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  He is in clinical remission from colon cancer.  He will be scheduled for an office visit, restaging CTs, and a CEA in approximately 4 months.  I will refer him for Port-A-Cath removal.  He has developed increased symptoms related to oxaliplatin neuropathy.  Hopefully this will improve over the next few months.  Betsy Coder, MD  04/26/2019  1:53 PM

## 2019-05-16 ENCOUNTER — Other Ambulatory Visit: Payer: Self-pay

## 2019-05-16 ENCOUNTER — Ambulatory Visit (HOSPITAL_COMMUNITY)
Admission: RE | Admit: 2019-05-16 | Discharge: 2019-05-16 | Disposition: A | Discharge status: Home / Self Care | Payer: Medicare Other | Source: Ambulatory Visit | Attending: Oncology | Admitting: Oncology

## 2019-05-16 DIAGNOSIS — C189 Malignant neoplasm of colon, unspecified: Secondary | ICD-10-CM | POA: Diagnosis not present

## 2019-05-16 DIAGNOSIS — C186 Malignant neoplasm of descending colon: Secondary | ICD-10-CM | POA: Diagnosis not present

## 2019-05-16 MED ORDER — HEPARIN SOD (PORK) LOCK FLUSH 100 UNIT/ML IV SOLN
INTRAVENOUS | Status: AC
  Filled 2019-05-16: qty 5

## 2019-05-16 MED ORDER — HEPARIN SOD (PORK) LOCK FLUSH 100 UNIT/ML IV SOLN
500.0000 [IU] | Freq: Once | INTRAVENOUS | Status: AC
  Administered 2019-05-16: 500 [IU] via INTRAVENOUS

## 2019-05-16 MED ORDER — IOHEXOL 300 MG/ML  SOLN
100.0000 mL | Freq: Once | INTRAMUSCULAR | Status: AC | PRN
  Administered 2019-05-16: 100 mL via INTRAVENOUS

## 2019-05-16 MED ORDER — SODIUM CHLORIDE (PF) 0.9 % IJ SOLN
INTRAMUSCULAR | Status: AC
  Filled 2019-05-16: qty 50

## 2019-05-18 ENCOUNTER — Telehealth: Payer: Self-pay

## 2019-05-18 NOTE — Telephone Encounter (Signed)
Spoke with wife in regards to scan results. Advised per Dr Benay Spice CT scans are negative for recurrent cancer and to f/u as scheduled. Wife verbalized understanding.

## 2019-05-18 NOTE — Telephone Encounter (Signed)
-----   Message from Ladell Pier, MD sent at 05/18/2019  1:36 PM EDT ----- Please call patient, CTs are negative for recurrent cancer, follow-up as scheduled

## 2019-05-23 DIAGNOSIS — Z135 Encounter for screening for eye and ear disorders: Secondary | ICD-10-CM | POA: Diagnosis not present

## 2019-05-23 DIAGNOSIS — H259 Unspecified age-related cataract: Secondary | ICD-10-CM | POA: Diagnosis not present

## 2019-05-24 ENCOUNTER — Encounter: Payer: Self-pay | Admitting: *Deleted

## 2019-05-24 ENCOUNTER — Other Ambulatory Visit: Payer: Self-pay | Admitting: Radiology

## 2019-05-24 NOTE — Progress Notes (Signed)
CT scan completed 05/16/2019, which is before MD had ordered scan (requested ~ 08/23/2019). Wife reports that radiology scheduling called them to set up the scan. Per Dr. Benay Spice, next scan will be done 6 months from last scan.

## 2019-05-26 ENCOUNTER — Ambulatory Visit (HOSPITAL_COMMUNITY)
Admission: RE | Admit: 2019-05-26 | Discharge: 2019-05-26 | Disposition: A | Discharge status: Home / Self Care | Payer: Medicare Other | Source: Ambulatory Visit | Attending: Oncology | Admitting: Oncology

## 2019-05-26 ENCOUNTER — Other Ambulatory Visit: Payer: Self-pay

## 2019-05-26 ENCOUNTER — Encounter (HOSPITAL_COMMUNITY): Payer: Self-pay

## 2019-05-26 DIAGNOSIS — Z9221 Personal history of antineoplastic chemotherapy: Secondary | ICD-10-CM | POA: Diagnosis not present

## 2019-05-26 DIAGNOSIS — C186 Malignant neoplasm of descending colon: Secondary | ICD-10-CM | POA: Insufficient documentation

## 2019-05-26 DIAGNOSIS — Z79899 Other long term (current) drug therapy: Secondary | ICD-10-CM | POA: Insufficient documentation

## 2019-05-26 DIAGNOSIS — Z85038 Personal history of other malignant neoplasm of large intestine: Secondary | ICD-10-CM | POA: Diagnosis not present

## 2019-05-26 DIAGNOSIS — F329 Major depressive disorder, single episode, unspecified: Secondary | ICD-10-CM | POA: Diagnosis not present

## 2019-05-26 DIAGNOSIS — I10 Essential (primary) hypertension: Secondary | ICD-10-CM | POA: Diagnosis not present

## 2019-05-26 DIAGNOSIS — Z452 Encounter for adjustment and management of vascular access device: Secondary | ICD-10-CM | POA: Insufficient documentation

## 2019-05-26 DIAGNOSIS — C189 Malignant neoplasm of colon, unspecified: Secondary | ICD-10-CM | POA: Diagnosis not present

## 2019-05-26 HISTORY — PX: IR REMOVAL TUN ACCESS W/ PORT W/O FL MOD SED: IMG2290

## 2019-05-26 LAB — CBC WITH DIFFERENTIAL/PLATELET
Abs Immature Granulocytes: 0.09 10*3/uL — ABNORMAL HIGH (ref 0.00–0.07)
Basophils Absolute: 0.1 10*3/uL (ref 0.0–0.1)
Basophils Relative: 1 %
Eosinophils Absolute: 0.5 10*3/uL (ref 0.0–0.5)
Eosinophils Relative: 4 %
HCT: 39.2 % (ref 39.0–52.0)
Hemoglobin: 12.8 g/dL — ABNORMAL LOW (ref 13.0–17.0)
Immature Granulocytes: 1 %
Lymphocytes Relative: 16 %
Lymphs Abs: 1.9 10*3/uL (ref 0.7–4.0)
MCH: 31.6 pg (ref 26.0–34.0)
MCHC: 32.7 g/dL (ref 30.0–36.0)
MCV: 96.8 fL (ref 80.0–100.0)
Monocytes Absolute: 0.9 10*3/uL (ref 0.1–1.0)
Monocytes Relative: 7 %
Neutro Abs: 8.4 10*3/uL — ABNORMAL HIGH (ref 1.7–7.7)
Neutrophils Relative %: 71 %
Platelets: 145 10*3/uL — ABNORMAL LOW (ref 150–400)
RBC: 4.05 MIL/uL — ABNORMAL LOW (ref 4.22–5.81)
RDW: 13.2 % (ref 11.5–15.5)
WBC: 11.7 10*3/uL — ABNORMAL HIGH (ref 4.0–10.5)
nRBC: 0 % (ref 0.0–0.2)

## 2019-05-26 LAB — PROTIME-INR
INR: 1 (ref 0.8–1.2)
Prothrombin Time: 13.2 seconds (ref 11.4–15.2)

## 2019-05-26 MED ORDER — SODIUM CHLORIDE 0.9 % IV SOLN
INTRAVENOUS | Status: DC
  Administered 2019-05-26: 11:00:00 via INTRAVENOUS

## 2019-05-26 MED ORDER — FENTANYL CITRATE (PF) 100 MCG/2ML IJ SOLN
INTRAMUSCULAR | Status: AC | PRN
  Administered 2019-05-26: 50 ug via INTRAVENOUS

## 2019-05-26 MED ORDER — MIDAZOLAM HCL 2 MG/2ML IJ SOLN
INTRAMUSCULAR | Status: AC
  Filled 2019-05-26: qty 2

## 2019-05-26 MED ORDER — LIDOCAINE HCL (PF) 1 % IJ SOLN
INTRAMUSCULAR | Status: AC | PRN
  Administered 2019-05-26: 5 mL

## 2019-05-26 MED ORDER — CEFAZOLIN SODIUM-DEXTROSE 2-4 GM/100ML-% IV SOLN
INTRAVENOUS | Status: AC
  Administered 2019-05-26: 2 g via INTRAVENOUS
  Filled 2019-05-26: qty 100

## 2019-05-26 MED ORDER — FENTANYL CITRATE (PF) 100 MCG/2ML IJ SOLN
INTRAMUSCULAR | Status: AC
  Filled 2019-05-26: qty 2

## 2019-05-26 MED ORDER — CEFAZOLIN SODIUM-DEXTROSE 2-4 GM/100ML-% IV SOLN
2.0000 g | INTRAVENOUS | Status: AC
  Administered 2019-05-26: 2 g via INTRAVENOUS

## 2019-05-26 MED ORDER — LIDOCAINE HCL 1 % IJ SOLN
INTRAMUSCULAR | Status: AC
  Filled 2019-05-26: qty 20

## 2019-05-26 MED ORDER — MIDAZOLAM HCL 2 MG/2ML IJ SOLN
INTRAMUSCULAR | Status: AC | PRN
  Administered 2019-05-26 (×2): 1 mg via INTRAVENOUS

## 2019-05-26 NOTE — Procedures (Signed)
Interventional Radiology Procedure Note  Procedure: Removal of a right IJ approach single lumen PowerPort.   Complications: None Recommendations:  - Ok to shower tomorrow - Do not submerge for 7 days - Routine care   Signed,  Laterica Matarazzo S. Meggin Ola, DO    

## 2019-05-26 NOTE — H&P (Signed)
Chief Complaint: Patient was seen in consultation today for colon cancer remission/Port-a-cath removal.  Referring Physician(s): Ladell Pier  Supervising Physician: Corrie Mckusick  Patient Status: [facility] - Out-pt  History of Present Illness: Jeremy Stevenson is a 68 y.o. male with a past medical history of hypertension, colon cancer, and depression. He was unfortunately diagnosed with colon cancer in 08/2018. His cancer is managed by Dr. Benay Spice. He had a Port-a-cath placed in IR 10/13/2018 by Dr. Kathlene Cote. He has undergone multiple rounds of systemic chemotherapy. Most recent lab tests reveal that patient's colon cancer is currently in remission, therefore Port-a-cath no longer needed.  IR consulted by Dr. Benay Spice for possible Port-a-cath removal. Patient awake and alert laying in bed with no complaints at this time. Denies fever, chills, chest pain, dyspnea, abdominal pain, or headache.   Past Medical History:  Diagnosis Date  . Depression   . Genetic testing 03/16/2017   [name] underwent genetic counseling and testing for hereditary cancer syndromes on 03/03/2017. His results were negative for mutations in all 46 genes analyzed by [facility]'s Common Hereditary Cancers Panel. Genes analyzed include: APC, ATM, AXIN2, BARD1, BMPR1A, BRCA1, BRCA2, BRIP1, CDH1, CDKN2A, CHEK2, CTNNA1, DICER1, EPCAM, GREM1, HOXB13, KIT, MEN1, MLH1, MSH2, MSH3, MSH6, MUTYH, NBN, NF1, NT  . Hypertension   . 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 COLONOSCOPY N/A 01/02/2017   Procedure: COLONOSCOPY;  Surgeon: Rogene Houston, MD;  Location: [facility];  Service: Endoscopy;  Laterality: N/A;  1000  . COLONOSCOPY N/A 08/07/2017   Procedure: COLONOSCOPY;  Surgeon: Rogene Houston, MD;  Location: [facility];  Service: Endoscopy;  Laterality: N/A;  1045 - per Lelon Frohlich LM of new time  . HERNIA REPAIR     68 years old  . IR IMAGING  GUIDED PORT INSERTION  10/13/2018  . IR RADIOLOGIST EVAL & MGMT  09/07/2018  . IR RADIOLOGIST EVAL & MGMT  11/03/2018  . LAPAROSCOPIC SIGMOID COLECTOMY N/A 01/20/2017   Procedure: LAPAROSCOPIC LEFT COLECTOMY, TAKEDOWN SPLENIC FLEXURE, AND BIOPSY OF PERITONEAL NODULE;  Surgeon: Fanny Skates, MD;  Location: [facility] ORS;  Service: General;  Laterality: N/A;  . RADIOLOGY WITH ANESTHESIA N/A 09/29/2018   Procedure: CT WITH ANESTHESIA/MICROWAVE THERMAL ABLATION OF LIVER, BIOPSY;  Surgeon: Corrie Mckusick, DO;  Location: [facility] ORS;  Service: Anesthesiology;  Laterality: N/A;  . TONSILLECTOMY     68 years old    Allergies: Codeine  Medications: Prior to Admission medications   Medication Sig Start Date End Date Taking? Authorizing Provider  amLODipine (NORVASC) 5 MG tablet Take 5 mg by mouth daily.  10/05/16  Yes [provider]  chlorthalidone (HYGROTON) 25 MG tablet Take 25 mg by mouth daily.    Yes [provider]  citalopram (CELEXA) 20 MG tablet Take 20 mg by mouth daily.  12/06/16  Yes [provider]  lidocaine-prilocaine (EMLA) cream Apply to portacath site 1 hour prior to use 10/19/18  Yes Owens Shark, NP  LORazepam (ATIVAN) 0.5 MG tablet Take 1 tablet (0.5 mg total) by mouth 2 (two) times daily as needed for anxiety. 03/28/19  Yes Owens Shark, NP  losartan (COZAAR) 100 MG tablet Take 100 mg by mouth daily.  11/29/16  Yes [provider]  metoprolol tartrate (LOPRESSOR) 100 MG tablet Take 1 tablet (100 mg total) by mouth 2 (two) times daily. 04/26/19  Yes Ladell Pier, MD  Naproxen Sodium (ALEVE  PO) Take by mouth.   Yes [provider]  potassium chloride SA (K-DUR,KLOR-CON) 20 MEQ tablet Take 1 tablet (20 mEq total) by mouth daily. 02/15/19  Yes Owens Shark, NP  prochlorperazine (COMPAZINE) 10 MG tablet Take 1 tablet (10 mg total) by mouth every 6 (six) hours as needed for nausea or vomiting. 10/19/18  Yes Owens Shark, NP  ibuprofen  (ADVIL,MOTRIN) 200 MG tablet Take 200-400 mg by mouth as needed.    [provider]     Family History  Problem Relation Age of Onset  . Breast cancer Mother 29  . Skin cancer Mother   . Ovarian cancer Sister 64       d.50 due to metastases  . Skin cancer Maternal Uncle   . Skin cancer Cousin 59  . Skin cancer Cousin 39    Social History   Socioeconomic History  . Marital status: Married    Spouse name: Not on file  . Number of children: Not on file  . Years of education: Not on file  . Highest education level: Not on file  Occupational History  . Not on file  Social Needs  . Financial resource strain: Not on file  . Food insecurity    Worry: Not on file    Inability: Not on file  . Transportation needs    Medical: Not on file    Non-medical: Not on file  Tobacco Use  . Smoking status: Never Smoker  . Smokeless tobacco: Never Used  Substance and Sexual Activity  . Alcohol use: No  . Drug use: No  . Sexual activity: Yes  Lifestyle  . Physical activity    Days per week: Not on file    Minutes per session: Not on file  . Stress: Not on file  Relationships  . Social Herbalist on phone: Not on file    Gets together: Not on file    Attends religious service: Not on file    Active member of club or organization: Not on file    Attends meetings of clubs or organizations: Not on file    Relationship status: Not on file  Other Topics Concern  . Not on file  Social History Narrative  . Not on file     Review of Systems: A 12 point ROS discussed and pertinent positives are indicated in the HPI above.  All other systems are negative.  Review of Systems  Constitutional: Negative for chills and fever.  Respiratory: Negative for shortness of breath and wheezing.   Cardiovascular: Negative for chest pain and palpitations.  Gastrointestinal: Negative for abdominal pain.  Neurological: Negative for headaches.  Psychiatric/Behavioral: Negative for  behavioral problems and confusion.    Vital Signs: BP (!) 148/100   Pulse (!) 58   Temp 98.5 F (36.9 C) (Oral)   Resp 20   Ht _0  (1.88 m)   Wt 269 lb (122 kg)   SpO2 100%   BMI 34.54 kg/m   Physical Exam Vitals signs and nursing note reviewed.  Constitutional:      General: He is not in acute distress.    Appearance: Normal appearance.  Cardiovascular:     Rate and Rhythm: Normal rate and regular rhythm.     Heart sounds: Normal heart sounds. No murmur.  Pulmonary:     Effort: Pulmonary effort is normal. No respiratory distress.     Breath sounds: Normal breath sounds. No wheezing.  Skin:  General: Skin is warm and dry.  Neurological:     Mental Status: He is alert and oriented to person, place, and time.  Psychiatric:        Mood and Affect: Mood normal.        Behavior: Behavior normal.        Thought Content: Thought content normal.        Judgment: Judgment normal.      MD Evaluation Airway: WNL Heart: WNL Abdomen: WNL Chest/ Lungs: WNL ASA  Classification: 3 Mallampati/Airway Score: Two   Imaging: Ct Chest W Contrast  Result Date: 05/16/2019 CLINICAL DATA:  Restaging colon cancer EXAM: CT CHEST, ABDOMEN, AND PELVIS WITH CONTRAST TECHNIQUE: Multidetector CT imaging of the chest, abdomen and pelvis was performed following the standard protocol during bolus administration of intravenous contrast. CONTRAST:  191m OMNIPAQUE IOHEXOL 300 MG/ML  SOLN COMPARISON:  Multiple exams, including MRI from 02/24/2019 and PET-CT from 08/03/2018 FINDINGS: CT CHEST FINDINGS Cardiovascular: Right Port-A-Cath tip: SVC. Atherosclerotic calcification of the thoracic aorta. Mediastinum/Nodes: No pathologic adenopathy identified. Lungs/Pleura: Small benign fat density along the pleura adjacent to the right middle lobe on image 81/6. Several small bulla are present, primarily in the upper lobes. 0.5 by 0.4 cm calcified lingular pulmonary nodule on image 88/6, favoring granuloma.  Musculoskeletal: Unremarkable CT ABDOMEN PELVIS FINDINGS Hepatobiliary: Oval-shaped 4.1 by 1.5 cm fluid density corresponding to the ablation site noted in the right hepatic lobe. As on the 02/24/2019 MRI, there is some mild intrahepatic biliary dilatation in segment 6 just distal to the ablation site, possibly from intrinsic stenosis related to the ablation or extrinsic stenosis from the ablation cavity. I do not see any specific differential enhancement along the margins of the ablation site to suggest recurrence, although MRI would probably be more sensitive for early signs of recurrence. Very faint hypodensity in segment 6 diffusely. Contracted gallbladder.  Extrahepatic biliary tree unremarkable. In segment 7 on image 49/2 a 2 mm hypodense lesion appears to correspond to a slightly dilated bile duct on prior MRI, but surveillance this location is suggested. Pancreas: Unremarkable Spleen: Unremarkable Adrenals/Urinary Tract: Slight nodularity of the right adrenal gland without overt mass. Preserved fetal lobulation of the kidneys. Previous small benign Bosniak category 1 and category 2 cysts are again observed, better characterized at MRI. The prostate gland indents the bladder base. Stomach/Bowel: Sigmoid colon diverticulosis. Prior partial left colectomy. Vascular/Lymphatic: Aortoiliac atherosclerotic vascular disease. Left external iliac node 1.3 cm in short axis on image 110/2, stable from 06/29/2018. Right external iliac node 1.1 cm in short axis on image 108/2, likewise stable. These lymph nodes were not hypermetabolic on the interval PET-CT. Reproductive: Moderate prostatomegaly. Other: No supplemental non-categorized findings. Musculoskeletal: Lumbar spondylosis, scoliosis, and degenerative disc disease with resulting right foraminal stenosis at L4-5. IMPRESSION: 1. No current findings of recurrence along the ablation site. There is some mild continued intrahepatic biliary dilatation in segment 6, as  well as some faint hypodensity in segment 6 relative to the rest of the liver which may be due to mildly altered vascularity. 2. Chronic mildly enlarged external iliac lymph nodes bilaterally were not hypermetabolic on interval PET-CT and are probably incidental. 3. 2 mm hypodensity in the segment 7 of the liver probably represents a slightly dilated segment of bile duct based on prior MRI. This is highly likely to be benign but merits surveillance. 4. Other imaging findings of potential clinical significance: Aortic Atherosclerosis (ICD10-I70.0). Previous benign-appearing renal cysts, better characterized by prior MRI, without significant size  change. Sigmoid colon diverticulosis. Partial left colectomy. Moderate prostatomegaly. Right foraminal impingement at L4-5. Electronically Signed   By: Van Clines M.D.   On: 05/16/2019 16:16   Ct Abdomen Pelvis W Contrast  Result Date: 05/16/2019 CLINICAL DATA:  Restaging colon cancer EXAM: CT CHEST, ABDOMEN, AND PELVIS WITH CONTRAST TECHNIQUE: Multidetector CT imaging of the chest, abdomen and pelvis was performed following the standard protocol during bolus administration of intravenous contrast. CONTRAST:  135m OMNIPAQUE IOHEXOL 300 MG/ML  SOLN COMPARISON:  Multiple exams, including MRI from 02/24/2019 and PET-CT from 08/03/2018 FINDINGS: CT CHEST FINDINGS Cardiovascular: Right Port-A-Cath tip: SVC. Atherosclerotic calcification of the thoracic aorta. Mediastinum/Nodes: No pathologic adenopathy identified. Lungs/Pleura: Small benign fat density along the pleura adjacent to the right middle lobe on image 81/6. Several small bulla are present, primarily in the upper lobes. 0.5 by 0.4 cm calcified lingular pulmonary nodule on image 88/6, favoring granuloma. Musculoskeletal: Unremarkable CT ABDOMEN PELVIS FINDINGS Hepatobiliary: Oval-shaped 4.1 by 1.5 cm fluid density corresponding to the ablation site noted in the right hepatic lobe. As on the 02/24/2019 MRI,  there is some mild intrahepatic biliary dilatation in segment 6 just distal to the ablation site, possibly from intrinsic stenosis related to the ablation or extrinsic stenosis from the ablation cavity. I do not see any specific differential enhancement along the margins of the ablation site to suggest recurrence, although MRI would probably be more sensitive for early signs of recurrence. Very faint hypodensity in segment 6 diffusely. Contracted gallbladder.  Extrahepatic biliary tree unremarkable. In segment 7 on image 49/2 a 2 mm hypodense lesion appears to correspond to a slightly dilated bile duct on prior MRI, but surveillance this location is suggested. Pancreas: Unremarkable Spleen: Unremarkable Adrenals/Urinary Tract: Slight nodularity of the right adrenal gland without overt mass. Preserved fetal lobulation of the kidneys. Previous small benign Bosniak category 1 and category 2 cysts are again observed, better characterized at MRI. The prostate gland indents the bladder base. Stomach/Bowel: Sigmoid colon diverticulosis. Prior partial left colectomy. Vascular/Lymphatic: Aortoiliac atherosclerotic vascular disease. Left external iliac node 1.3 cm in short axis on image 110/2, stable from 06/29/2018. Right external iliac node 1.1 cm in short axis on image 108/2, likewise stable. These lymph nodes were not hypermetabolic on the interval PET-CT. Reproductive: Moderate prostatomegaly. Other: No supplemental non-categorized findings. Musculoskeletal: Lumbar spondylosis, scoliosis, and degenerative disc disease with resulting right foraminal stenosis at L4-5. IMPRESSION: 1. No current findings of recurrence along the ablation site. There is some mild continued intrahepatic biliary dilatation in segment 6, as well as some faint hypodensity in segment 6 relative to the rest of the liver which may be due to mildly altered vascularity. 2. Chronic mildly enlarged external iliac lymph nodes bilaterally were not  hypermetabolic on interval PET-CT and are probably incidental. 3. 2 mm hypodensity in the segment 7 of the liver probably represents a slightly dilated segment of bile duct based on prior MRI. This is highly likely to be benign but merits surveillance. 4. Other imaging findings of potential clinical significance: Aortic Atherosclerosis (ICD10-I70.0). Previous benign-appearing renal cysts, better characterized by prior MRI, without significant size change. Sigmoid colon diverticulosis. Partial left colectomy. Moderate prostatomegaly. Right foraminal impingement at L4-5. Electronically Signed   By: WVan ClinesM.D.   On: 05/16/2019 16:16    Labs:  CBC: Recent Labs    03/14/19 1004 03/28/19 0918 04/26/19 0947 05/26/19 1021  WBC 8.7 12.6* 10.9* 11.7*  HGB 11.5* 11.7* 11.6* 12.8*  HCT 34.2* 35.8* 35.5* 39.2  PLT 99* 137* 135* 145*    COAGS: Recent Labs    09/29/18 0700 05/26/19 1021  INR 0.97 1.0    BMP: Recent Labs    02/28/19 1018 03/14/19 1004 03/28/19 0918 04/26/19 0947  NA 138 138 138 139  K 3.3* 3.4* 3.5 4.1  CL 101 102 99 100  CO2 _0 GLUCOSE 136* 124* 119* 115*  BUN _1 CALCIUM 9.4 9.5 8.8* 9.6  CREATININE 1.22 1.25* 1.11 1.04  GFRNONAA >60 60* >60 >60  GFRAA >60 >60 >60 >60    LIVER FUNCTION TESTS: Recent Labs    02/28/19 1018 03/14/19 1004 03/28/19 0918 04/26/19 0947  BILITOT 0.5 0.6 0.7 0.6  AST 33 _2 ALT _3 ALKPHOS 147* 149* 153* 135*  PROT 7.2 7.4 7.3 7.5  ALBUMIN 3.2* 3.2* 3.2* 3.4*     Assessment and Plan:  Colon cancer currently in remission, Port-a-cath no longer needed. Plan for Port-a-cath removal today with Dr. Earleen Newport. Patient is NPO. Afebrile. He does not take blood thinners. INR 1.0 today.  Risks and benefits of image guided port-a-catheter removal was discussed with the patient including, but not limited to bleeding, infection, pneumothorax, or fibrin sheath development and need for  additional procedures. All of the patient's questions were answered, patient is agreeable to proceed. Consent signed and in chart.   Thank you for this interesting consult.  I greatly enjoyed meeting Jeremy Stevenson and look forward to participating in their care.  A copy of this report was sent to the requesting provider on this date.  Electronically Signed: Earley Abide, PA-C 05/26/2019, 11:08 AM   I spent a total of 40 Minutes in face to face in clinical consultation, greater than 50% of which was counseling/coordinating care for colon cancer remission/Port-a-cath removal.

## 2019-05-26 NOTE — Discharge Instructions (Signed)
Implanted Port Removal, Care After °This sheet gives you information about how to care for yourself after your procedure. Your health care provider may also give you more specific instructions. If you have problems or questions, contact your health care provider. °What can I expect after the procedure? °After the procedure, it is common to have: °· Soreness or pain near your incision. °· Some swelling or bruising near your incision. °Follow these instructions at home: °Medicines °· Take over-the-counter and prescription medicines only as told by your health care provider. °· If you were prescribed an antibiotic medicine, take it as told by your health care provider. Do not stop taking the antibiotic even if you start to feel better. °Bathing °· Do not take baths, swim, or use a hot tub until your health care provider approves. Ask your health care provider if you can take showers. You may only be allowed to take sponge baths. °Incision care ° °· Follow instructions from your health care provider about how to take care of your incision. Make sure you: °? Wash your hands with soap and water before you change your bandage (dressing). If soap and water are not available, use hand sanitizer. °? Change your dressing as told by your health care provider. °? Keep your dressing dry. °? Leave stitches (sutures), skin glue, or adhesive strips in place. These skin closures may need to stay in place for 2 weeks or longer. If adhesive strip edges start to loosen and curl up, you may trim the loose edges. Do not remove adhesive strips completely unless your health care provider tells you to do that. °· Check your incision area every day for signs of infection. Check for: °? More redness, swelling, or pain. °? More fluid or blood. °? Warmth. °? Pus or a bad smell. °Driving ° °· Do not drive for 24 hours if you were given a medicine to help you relax (sedative) during your procedure. °· If you did not receive a sedative, ask your  health care provider when it is safe to drive. °Activity °· Return to your normal activities as told by your health care provider. Ask your health care provider what activities are safe for you. °· Do not lift anything that is heavier than 10 lb (4.5 kg), or the limit that you are told, until your health care provider says that it is safe. °· Do not do activities that involve lifting your arms over your head. °General instructions °· Do not use any products that contain nicotine or tobacco, such as cigarettes and e-cigarettes. These can delay healing. If you need help quitting, ask your health care provider. °· Keep all follow-up visits as told by your health care provider. This is important. °Contact a health care provider if: °· You have more redness, swelling, or pain around your incision. °· You have more fluid or blood coming from your incision. °· Your incision feels warm to the touch. °· You have pus or a bad smell coming from your incision. °· You have pain that is not relieved by your pain medicine. °Get help right away if you have: °· A fever or chills. °· Chest pain. °· Difficulty breathing. °Summary °· After the procedure, it is common to have pain, soreness, swelling, or bruising near your incision. °· If you were prescribed an antibiotic medicine, take it as told by your health care provider. Do not stop taking the antibiotic even if you start to feel better. °· Do not drive for 24 hours   if you were given a sedative during your procedure. °· Return to your normal activities as told by your health care provider. Ask your health care provider what activities are safe for you. °This information is not intended to replace advice given to you by your health care provider. Make sure you discuss any questions you have with your health care provider. °Document Released: 10/15/2015 Document Revised: 12/17/2017 Document Reviewed: 12/17/2017 °Elsevier Patient Education © 2020 [employer] ° ° ° °Moderate  Conscious Sedation, Adult, Care After °These instructions provide you with information about caring for yourself after your procedure. Your health care provider may also give you more specific instructions. Your treatment has been planned according to current medical practices, but problems sometimes occur. Call your health care provider if you have any problems or questions after your procedure. °What can I expect after the procedure? °After your procedure, it is common: °· To feel sleepy for several hours. °· To feel clumsy and have poor balance for several hours. °· To have poor judgment for several hours. °· To vomit if you eat too soon. °Follow these instructions at home: °For at least 24 hours after the procedure: ° °· Do not: °? Participate in activities where you could fall or become injured. °? Drive. °? Use heavy machinery. °? Drink alcohol. °? Take sleeping pills or medicines that cause drowsiness. °? Make important decisions or sign legal documents. °? Take care of children on your own. °· Rest. °Eating and drinking °· Follow the diet recommended by your health care provider. °· If you vomit: °? Drink water, juice, or soup when you can drink without vomiting. °? Make sure you have little or no nausea before eating solid foods. °General instructions °· Have a responsible adult stay with you until you are awake and alert. °· Take over-the-counter and prescription medicines only as told by your health care provider. °· If you smoke, do not smoke without supervision. °· Keep all follow-up visits as told by your health care provider. This is important. °Contact a health care provider if: °· You keep feeling nauseous or you keep vomiting. °· You feel light-headed. °· You develop a rash. °· You have a fever. °Get help right away if: °· You have trouble breathing. °This information is not intended to replace advice given to you by your health care provider. Make sure you discuss any questions you have with your  health care provider. °Document Released: 08/24/2013 Document Revised: 10/16/2017 Document Reviewed: 02/23/2016 °Elsevier Patient Education © 2020 [employer] Inc. ° °

## 2019-06-07 DIAGNOSIS — H02889 Meibomian gland dysfunction of unspecified eye, unspecified eyelid: Secondary | ICD-10-CM | POA: Diagnosis not present

## 2019-06-07 DIAGNOSIS — H25811 Combined forms of age-related cataract, right eye: Secondary | ICD-10-CM | POA: Diagnosis not present

## 2019-06-07 DIAGNOSIS — Z01818 Encounter for other preprocedural examination: Secondary | ICD-10-CM | POA: Diagnosis not present

## 2019-06-07 DIAGNOSIS — H25812 Combined forms of age-related cataract, left eye: Secondary | ICD-10-CM | POA: Diagnosis not present

## 2019-07-04 DIAGNOSIS — I10 Essential (primary) hypertension: Secondary | ICD-10-CM | POA: Diagnosis not present

## 2019-07-04 DIAGNOSIS — Z299 Encounter for prophylactic measures, unspecified: Secondary | ICD-10-CM | POA: Diagnosis not present

## 2019-07-04 DIAGNOSIS — Z6834 Body mass index (BMI) 34.0-34.9, adult: Secondary | ICD-10-CM | POA: Diagnosis not present

## 2019-07-04 DIAGNOSIS — G4700 Insomnia, unspecified: Secondary | ICD-10-CM | POA: Diagnosis not present

## 2019-07-04 DIAGNOSIS — G4730 Sleep apnea, unspecified: Secondary | ICD-10-CM | POA: Diagnosis not present

## 2019-07-07 DIAGNOSIS — H25811 Combined forms of age-related cataract, right eye: Secondary | ICD-10-CM | POA: Diagnosis not present

## 2019-07-07 DIAGNOSIS — H2511 Age-related nuclear cataract, right eye: Secondary | ICD-10-CM | POA: Diagnosis not present

## 2019-07-21 DIAGNOSIS — H2512 Age-related nuclear cataract, left eye: Secondary | ICD-10-CM | POA: Diagnosis not present

## 2019-07-21 DIAGNOSIS — H25812 Combined forms of age-related cataract, left eye: Secondary | ICD-10-CM | POA: Diagnosis not present

## 2019-08-16 ENCOUNTER — Other Ambulatory Visit: Payer: Self-pay | Admitting: Interventional Radiology

## 2019-08-16 DIAGNOSIS — C186 Malignant neoplasm of descending colon: Secondary | ICD-10-CM

## 2019-08-16 DIAGNOSIS — R160 Hepatomegaly, not elsewhere classified: Secondary | ICD-10-CM

## 2019-08-23 ENCOUNTER — Inpatient Hospital Stay: Payer: Medicare Other | Attending: Oncology

## 2019-08-23 ENCOUNTER — Other Ambulatory Visit: Payer: Self-pay

## 2019-08-23 ENCOUNTER — Telehealth: Payer: Self-pay | Admitting: *Deleted

## 2019-08-23 DIAGNOSIS — Z23 Encounter for immunization: Secondary | ICD-10-CM | POA: Insufficient documentation

## 2019-08-23 DIAGNOSIS — C186 Malignant neoplasm of descending colon: Secondary | ICD-10-CM | POA: Diagnosis not present

## 2019-08-23 LAB — BASIC METABOLIC PANEL - CANCER CENTER ONLY
Anion gap: 10 (ref 5–15)
BUN: 20 mg/dL (ref 8–23)
CO2: 30 mmol/L (ref 22–32)
Calcium: 9.8 mg/dL (ref 8.9–10.3)
Chloride: 99 mmol/L (ref 98–111)
Creatinine: 1.08 mg/dL (ref 0.61–1.24)
GFR, Est AFR Am: >60 mL/min (ref >60)
GFR, Estimated: >60 mL/min (ref >60)
Glucose, Bld: 130 mg/dL — ABNORMAL HIGH (ref 70–99)
Potassium: 3.5 mmol/L (ref 3.5–5.1)
Sodium: 139 mmol/L (ref 135–145)

## 2019-08-23 LAB — CEA (IN HOUSE-CHCC): CEA (CHCC-In House): 2.12 ng/mL (ref 0.00–5.00)

## 2019-08-23 NOTE — Telephone Encounter (Signed)
Wife left VM requesting to move 10/8 appointments to 10/12 or 10/11. Sent message to scheduling.

## 2019-08-25 ENCOUNTER — Ambulatory Visit: Payer: Medicare Other | Admitting: Oncology

## 2019-08-29 ENCOUNTER — Inpatient Hospital Stay (HOSPITAL_BASED_OUTPATIENT_CLINIC_OR_DEPARTMENT_OTHER): Payer: Medicare Other | Admitting: Oncology

## 2019-08-29 ENCOUNTER — Other Ambulatory Visit: Payer: Self-pay

## 2019-08-29 ENCOUNTER — Telehealth: Payer: Self-pay | Admitting: *Deleted

## 2019-08-29 VITALS — BP 165/97 | HR 64 | Temp 98.5°F | Resp 18 | Ht 74.0 in | Wt 273.8 lb

## 2019-08-29 DIAGNOSIS — C186 Malignant neoplasm of descending colon: Secondary | ICD-10-CM | POA: Diagnosis not present

## 2019-08-29 DIAGNOSIS — Z23 Encounter for immunization: Secondary | ICD-10-CM

## 2019-08-29 MED ORDER — INFLUENZA VAC A&B SA ADJ QUAD 0.5 ML IM PRSY
0.5000 mL | PREFILLED_SYRINGE | Freq: Once | INTRAMUSCULAR | Status: AC
  Administered 2019-08-29: 0.5 mL via INTRAMUSCULAR

## 2019-08-29 MED ORDER — INFLUENZA VAC A&B SA ADJ QUAD 0.5 ML IM PRSY
PREFILLED_SYRINGE | INTRAMUSCULAR | Status: AC
  Filled 2019-08-29: qty 0.5

## 2019-08-29 NOTE — Telephone Encounter (Signed)
Called wife as requested with summary of MD visit today. Provided phone # for central scheduling for them to schedule the CT scan MD wants in a few weeks. Informed her that is has been authorized.

## 2019-08-29 NOTE — Progress Notes (Signed)
[facility] OFFICE PROGRESS NOTE   Diagnosis: Colon cancer  INTERVAL HISTORY:   Jeremy Stevenson reports feeling well.  Good appetite.  No difficulty with bowel function.  He continues to have numbness and tingling in the hands and feet.  He occasionally drops objects.  No difficulty walking though he is "tripped "a few times.  Objective:  Vital signs in last 24 hours:  Blood pressure (!) 165/97, pulse 64, temperature 98.5 F (36.9 C), temperature source Temporal, resp. rate 18, height '6\' 2"'  (1.88 m), weight 273 lb 12.8 oz (124.2 kg), SpO2 99 %.   Limited physical examination secondary to distancing with the COVID pandemic HEENT: Neck without mass Lymphatics: No cervical, supraclavicular, axillary, or inguinal nodes GI: No hepatosplenomegaly, no mass, nontender Vascular: Venous varicosities at the left greater than right lower leg with stasis change of the left lower leg   Lab Results:  Lab Results  Component Value Date   WBC 11.7 (H) 05/26/2019   HGB 12.8 (L) 05/26/2019   HCT 39.2 05/26/2019   MCV 96.8 05/26/2019   PLT 145 (L) 05/26/2019   NEUTROABS 8.4 (H) 05/26/2019    CMP  Lab Results  Component Value Date   NA 139 08/23/2019   K 3.5 08/23/2019   CL 99 08/23/2019   CO2 30 08/23/2019   GLUCOSE 130 (H) 08/23/2019   BUN 20 08/23/2019   CREATININE 1.08 08/23/2019   CALCIUM 9.8 08/23/2019   PROT 7.5 04/26/2019   ALBUMIN 3.4 (L) 04/26/2019   AST 24 04/26/2019   ALT 19 04/26/2019   ALKPHOS 135 (H) 04/26/2019   BILITOT 0.6 04/26/2019   GFRNONAA >60 08/23/2019   GFRAA >60 08/23/2019    Lab Results  Component Value Date   CEA1 2.12 08/23/2019    Medications: I have reviewed the patient's current medications.   Assessment/Plan:  1. Adenocarcinoma of the descending colon, stage II (T3 N0), status post a left colectomy 01/20/2017 ? MSI-stable, no loss of mismatch repair protein expression ? Elevated preoperative CEA ? Incomplete preoperative  colonoscopy ? Colonoscopy 08/07/2017-multiple polyps removed including tubular adenomas ? CT abdomen/pelvis 02/05/2018-no evidence of metastatic disease. ? Elevated CEA June 2019 ? CTs 06/29/2018-no evidence of metastatic disease, stable mild bilateral iliac adenopathy ? PET scan 05/10/7627-BTDVVOHY hypermetabolic central right liver metastasis;asymmetric hypermetabolism right palatine tonsil with associated mild asymmetric soft tissue fullness on CT images ? MRI liver 08/25/2018-2.6 cm mass in the central right liver consistent with metastasis, no other evidence of metastatic disease ? Ablation and biopsy of right liver lesion 09/29/2018-pathology revealed adenocarcinoma; foundation 1-KRAS G12V, NRAS wildtype, microsatellite stable, tumor mutational burden 1 ? Cycle 1 FOLFOX 10/25/2018 ? Cycle 2 FOLFOX 11/09/2018 ? Cycle 3 FOLFOX 11/23/2018 ? Cycle 4 FOLFOX 12/07/2018 ? Cycle 5 FOLFOX 12/21/2018 (oxaliplatin dose reduced secondary to thrombocytopenia) ? Cycle 6 FOLFOX 01/04/2019 ? Cycle 7 FOLFOX 01/18/2019 ? Cycle 8 FOLFOX 02/01/2019 ? Cycle 9 FOLFOX 02/15/2019 ? MRI abdomen 02/24/2019- ablation site in the liver without residual tumor identified. Mild intrahepatic biliary dilatation distal to the ablation site. Diffuse hepatic steatosis. No new liver lesions identified. ? Cycle 10 FOLFOX 02/28/2019 (oxaliplatin held due to neuropathy and thrombocytopenia) ? Cycle 11 FOLFOX 03/14/2019 (oxaliplatin eliminated from the regimen due to neuropathy, 5-fluorouracil dose adjusted due to diarrhea and tearing) ? Cycle 12 FOLFOX 03/28/2019 (no oxaliplatin, 5-fluorouracil as per treatment 03/14/2019)  2. Enlargement of the right testicle-hydrocele-Testicular ultrasound 02/26/2017-negative for mass, small bilateral hydroceles  3. Hypertension  4. Depression  5. Family  history of breast and ovarian cancer, negative genetic testing  6.History of left leg edema, pain.  7.Pigmented lesion right lower  leg-refer to dermatology 06/30/2018  8. 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  He will be scheduled for restaging CTs within the next few weeks.  He will return for an office visit and CEA in 4 months.  Betsy Coder, MD  08/29/2019  10:17 AM

## 2019-08-30 ENCOUNTER — Telehealth: Payer: Self-pay | Admitting: Oncology

## 2019-08-30 NOTE — Telephone Encounter (Signed)
Scheduled per los. Called and spoke with patients wife. confimed appt  °

## 2019-09-08 ENCOUNTER — Other Ambulatory Visit: Payer: Self-pay

## 2019-09-08 ENCOUNTER — Ambulatory Visit (HOSPITAL_COMMUNITY)
Admission: RE | Admit: 2019-09-08 | Discharge: 2019-09-08 | Disposition: A | Discharge status: Home / Self Care | Payer: Medicare Other | Source: Ambulatory Visit | Attending: Oncology | Admitting: Oncology

## 2019-09-08 ENCOUNTER — Encounter (HOSPITAL_COMMUNITY): Payer: Self-pay

## 2019-09-08 DIAGNOSIS — C186 Malignant neoplasm of descending colon: Secondary | ICD-10-CM

## 2019-09-08 DIAGNOSIS — C189 Malignant neoplasm of colon, unspecified: Secondary | ICD-10-CM | POA: Diagnosis not present

## 2019-09-08 MED ORDER — IOHEXOL 300 MG/ML  SOLN
100.0000 mL | Freq: Once | INTRAMUSCULAR | Status: AC | PRN
  Administered 2019-09-08: 11:00:00 100 mL via INTRAVENOUS

## 2019-09-08 MED ORDER — SODIUM CHLORIDE (PF) 0.9 % IJ SOLN
INTRAMUSCULAR | Status: AC
  Filled 2019-09-08: qty 50

## 2019-09-09 ENCOUNTER — Telehealth: Payer: Self-pay | Admitting: *Deleted

## 2019-09-09 NOTE — Telephone Encounter (Signed)
Left VM to return call to office-non urgent.

## 2019-09-09 NOTE — Telephone Encounter (Signed)
-----   Message from Ladell Pier, MD sent at 09/08/2019  4:47 PM EDT ----- Please call patient, ct is negative for recurrent cancer, f/u as scheduled

## 2019-09-14 NOTE — Telephone Encounter (Signed)
Notified wife of negative CT results.

## 2019-10-10 DIAGNOSIS — G629 Polyneuropathy, unspecified: Secondary | ICD-10-CM | POA: Diagnosis not present

## 2019-10-10 DIAGNOSIS — Z299 Encounter for prophylactic measures, unspecified: Secondary | ICD-10-CM | POA: Diagnosis not present

## 2019-10-10 DIAGNOSIS — I10 Essential (primary) hypertension: Secondary | ICD-10-CM | POA: Diagnosis not present

## 2019-10-10 DIAGNOSIS — Z6835 Body mass index (BMI) 35.0-35.9, adult: Secondary | ICD-10-CM | POA: Diagnosis not present

## 2019-10-10 DIAGNOSIS — C189 Malignant neoplasm of colon, unspecified: Secondary | ICD-10-CM | POA: Diagnosis not present

## 2019-10-25 ENCOUNTER — Other Ambulatory Visit: Payer: Self-pay

## 2019-10-25 ENCOUNTER — Ambulatory Visit
Admission: RE | Admit: 2019-10-25 | Discharge: 2019-10-25 | Disposition: A | Discharge status: Home / Self Care | Payer: Medicare Other | Source: Ambulatory Visit | Attending: Interventional Radiology | Admitting: Interventional Radiology

## 2019-10-25 DIAGNOSIS — C787 Secondary malignant neoplasm of liver and intrahepatic bile duct: Secondary | ICD-10-CM | POA: Diagnosis not present

## 2019-10-25 DIAGNOSIS — C186 Malignant neoplasm of descending colon: Secondary | ICD-10-CM

## 2019-10-25 DIAGNOSIS — R160 Hepatomegaly, not elsewhere classified: Secondary | ICD-10-CM

## 2019-10-25 HISTORY — PX: IR RADIOLOGIST EVAL & MGMT: IMG5224

## 2019-10-25 NOTE — Progress Notes (Signed)
Chief Complaint: Colorectal Carcinoma, oligometastatic disease to the liver, remission  Referring Physician(s): Dr. Ladell Pier   History of Present Illness: Jeremy Stevenson is a 68 y.o. male presenting as a scheduled follow up to [facility] today, referred by Dr. Julieanne Manson, SP treatment of single, pathologic-proven CR met to the right liver with microwave ablation.    Jeremy Stevenson joins [place] today by telephone with his wife for the interview, given the COVID situation. I confirmed ID with two identifiers.   History: He is a gentleman who was diagnosed February of 2018 with a left colon cancer.  This was discovered during a work-up of lower GI bleeding. Dr. Laural Golden performed colonoscopy 01/02/2017, with mass discovered and biopsy performed.  Subsequently the patient has surgery with Dr. Dalbert Batman on 01/20/2017 with left colectomy.   ________________________________ Pathology report 01/20/2017: Colon:  - Invasive adenocarcinoma, moderately differentiated spanning 6.8cm - invades pericolonic tissue - resection margins negative -heterotopic ossification -20 of 20 nodes negative for malignancy (0/20) - MSI -stable  Initial staging was stage II (T3 N0).  No chemotherapy.    Interval History: We last had appointment 11/03/2018, after our procedure, which was performed 09/29/2018.  He tells me is feeling fine, and tells me he has no symptoms now.  He is completing his daily tasks, and denies any GI or other symptoms.  ECOG of 0.  His last discussion with Dr. Benay Spice was 08/29/2019. We took our his port in July of this year.  He had CT imaging on 09/08/2019 which shows scarring at the site of ablation, and no concern for residual, recurrence, or other sites of mets.   Baseline CEA 01/16/2017 was 27.0.  Post surgery on 02/13/2017 was 1.67 CEA remained below 5.0 on multiple after surgery, and was noted to be up 04/19/2018, at 8.69, then 06/21/2018 was 24.67.   His last CEA was completed 08/23/2019,  which is 2.12, within normal range.   He has had no recent hospitalization.   Past Medical History:  Diagnosis Date  . Depression   . Genetic testing 03/16/2017   Jeremy. Stevenson underwent genetic counseling and testing for hereditary cancer syndromes on 03/03/2017. His results were negative for mutations in all 46 genes analyzed by [employer]'s Common Hereditary Cancers Panel. Genes analyzed include: APC, ATM, AXIN2, BARD1, BMPR1A, BRCA1, BRCA2, BRIP1, CDH1, CDKN2A, CHEK2, CTNNA1, DICER1, EPCAM, GREM1, HOXB13, KIT, MEN1, MLH1, MSH2, MSH3, MSH6, MUTYH, NBN, NF1, NT  . Hypertension   . 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 COLONOSCOPY N/A 01/02/2017   Procedure: COLONOSCOPY;  Surgeon: Rogene Houston, MD;  Location: [facility];  Service: Endoscopy;  Laterality: N/A;  1000  . COLONOSCOPY N/A 08/07/2017   Procedure: COLONOSCOPY;  Surgeon: Rogene Houston, MD;  Location: [facility];  Service: Endoscopy;  Laterality: N/A;  1045 - per Lelon Frohlich LM of new time  . HERNIA REPAIR     68 years old  . IR IMAGING GUIDED PORT INSERTION  10/13/2018  . IR RADIOLOGIST EVAL & MGMT  09/07/2018  . IR RADIOLOGIST EVAL & MGMT  11/03/2018  . IR REMOVAL TUN ACCESS W/ PORT W/O FL MOD SED  05/26/2019  . LAPAROSCOPIC SIGMOID COLECTOMY N/A 01/20/2017   Procedure: LAPAROSCOPIC LEFT COLECTOMY, TAKEDOWN SPLENIC FLEXURE, AND BIOPSY OF PERITONEAL NODULE;  Surgeon: Fanny Skates, MD;  Location: [facility];  Service: General;  Laterality: N/A;  . RADIOLOGY WITH ANESTHESIA N/A  09/29/2018   Procedure: CT WITH ANESTHESIA/MICROWAVE THERMAL ABLATION OF LIVER, BIOPSY;  Surgeon: Corrie Mckusick, DO;  Location: [facility];  Service: Anesthesiology;  Laterality: N/A;  . TONSILLECTOMY     68 years old    Allergies: Codeine  Medications: Prior to Admission medications   Medication Sig Start Date End Date Taking? Authorizing Provider  amLODipine (NORVASC) 5 MG tablet Take  5 mg by mouth daily.  10/05/16   [provider]  chlorthalidone (HYGROTON) 25 MG tablet Take 25 mg by mouth daily.     [provider]  citalopram (CELEXA) 20 MG tablet Take 20 mg by mouth daily.  12/06/16   [provider]  ibuprofen (ADVIL,MOTRIN) 200 MG tablet Take 200-400 mg by mouth as needed.    [provider]  LORazepam (ATIVAN) 0.5 MG tablet Take 1 tablet (0.5 mg total) by mouth 2 (two) times daily as needed for anxiety. 03/28/19   Owens Shark, NP  losartan (COZAAR) 100 MG tablet Take 100 mg by mouth daily.  11/29/16   [provider]  metoprolol tartrate (LOPRESSOR) 100 MG tablet Take 1 tablet (100 mg total) by mouth 2 (two) times daily. 04/26/19   Ladell Pier, MD  Naproxen Sodium (ALEVE PO) Take by mouth.    [provider]  potassium chloride SA (K-DUR,KLOR-CON) 20 MEQ tablet Take 1 tablet (20 mEq total) by mouth daily. 02/15/19   Owens Shark, NP     Family History  Problem Relation Age of Onset  . Breast cancer Mother 53  . Skin cancer Mother   . Ovarian cancer Sister 22       d.50 due to metastases  . Skin cancer Maternal Uncle   . Skin cancer Cousin 78  . Skin cancer Cousin 78    Social History   Socioeconomic History  . Marital status: Married    Spouse name: Not on file  . Number of children: Not on file  . Years of education: Not on file  . Highest education level: Not on file  Occupational History  . Not on file  Social Needs  . Financial resource strain: Not on file  . Food insecurity    Worry: Not on file    Inability: Not on file  . Transportation needs    Medical: Not on file    Non-medical: Not on file  Tobacco Use  . Smoking status: Never Smoker  . Smokeless tobacco: Never Used  Substance and Sexual Activity  . Alcohol use: No  . Drug use: No  . Sexual activity: Yes  Lifestyle  . Physical activity    Days per week: Not on file    Minutes per session: Not on file  . Stress: Not on  file  Relationships  . Social Herbalist on phone: Not on file    Gets together: Not on file    Attends religious service: Not on file    Active member of club or organization: Not on file    Attends meetings of clubs or organizations: Not on file    Relationship status: Not on file  Other Topics Concern  . Not on file  Social History Narrative  . Not on file    ECOG Status: 0 - Asymptomatic  Review of Systems  Review of Systems: A 12 point ROS discussed and pertinent positives are indicated in the HPI above.  All other systems are negative.  Physical Exam No direct physical exam was  performed (except for noted visual exam findings with Video Visits).    Vital Signs: There were no vitals taken for this visit.  Imaging: No results found.  Labs:  CBC: Recent Labs    03/14/19 1004 03/28/19 0918 04/26/19 0947 05/26/19 1021  WBC 8.7 12.6* 10.9* 11.7*  HGB 11.5* 11.7* 11.6* 12.8*  HCT 34.2* 35.8* 35.5* 39.2  PLT 99* 137* 135* 145*    COAGS: Recent Labs    05/26/19 1021  INR 1.0    BMP: Recent Labs    03/14/19 1004 03/28/19 0918 04/26/19 0947 08/23/19 0913  NA 138 138 139 139  K 3.4* 3.5 4.1 3.5  CL 102 99 100 99  CO2 _0 GLUCOSE 124* 119* 115* 130*  BUN _1 CALCIUM 9.5 8.8* 9.6 9.8  CREATININE 1.25* 1.11 1.04 1.08  GFRNONAA 60* >60 >60 >60  GFRAA >60 >60 >60 >60    LIVER FUNCTION TESTS: Recent Labs    02/28/19 1018 03/14/19 1004 03/28/19 0918 04/26/19 0947  BILITOT 0.5 0.6 0.7 0.6  AST 33 _2 ALT _3 ALKPHOS 147* 149* 153* 135*  PROT 7.2 7.4 7.3 7.5  ALBUMIN 3.2* 3.2* 3.2* 3.4*    TUMOR MARKERS: No results for input(s): AFPTM, CEA, CA199, CHROMGRNA in the last 8760 hours.  Assessment and Plan:  Jeremy Stevenson is a very pleasant 68yo male with history of colon carcinoma, oligometastatic disease to the right liver, treated by Dr. Benay Spice with systemic therapy, and the lesion treated with  microwave ablation.    CT 09/08/2019 shows complete response (CR) at the right liver site, with no other disease.  His CEA is normal 08/23/2019.  He is in remission.    We had a fairly brief discussion regarding his imaging, lab values, and our surveillance strategy, which will be important.  We will plan on seeing him after Dr. Gearldine Shown next appointment, which can be in 4-6 months, with repeat CEA and abdominal imaging with CT.    He understands and appreciates our plan. He seems very satisfied.   Plan: - Repeat office visit after Dr. Gearldine Shown next visit, 4-6 months with CEA and repeat abd/pelvis CT.  - I have advised him to observe his future follow up appointments with his physicians   Electronically Signed: Corrie Mckusick 10/25/2019, 9:12 AM   I spent a total of    15 Minutes in remote  clinical consultation, greater than 50% of which was counseling/coordinating care for oligometastatic liver colorectal carcinoma, SP microwave ablation.    Visit type: Audio only (telephone). Audio (no video) only due to patient's lack of internet/smartphone capability. Alternative for in-person consultation at [facility], [address], [place], [place]. This visit type was conducted due to national recommendations for restrictions regarding the COVID-19 Pandemic (e.g. social distancing).  This format is felt to be most appropriate for this patient at this time.  All issues noted in this document were discussed and addressed.

## 2019-12-30 ENCOUNTER — Telehealth: Payer: Self-pay | Admitting: *Deleted

## 2019-12-30 ENCOUNTER — Other Ambulatory Visit: Payer: Self-pay

## 2019-12-30 ENCOUNTER — Inpatient Hospital Stay: Payer: Medicare Other | Attending: Oncology | Admitting: Oncology

## 2019-12-30 ENCOUNTER — Inpatient Hospital Stay: Payer: Medicare Other

## 2019-12-30 VITALS — BP 160/88 | HR 56 | Temp 98.0°F | Resp 20 | Ht 74.0 in | Wt 276.0 lb

## 2019-12-30 DIAGNOSIS — D6959 Other secondary thrombocytopenia: Secondary | ICD-10-CM | POA: Diagnosis not present

## 2019-12-30 DIAGNOSIS — C787 Secondary malignant neoplasm of liver and intrahepatic bile duct: Secondary | ICD-10-CM | POA: Insufficient documentation

## 2019-12-30 DIAGNOSIS — F329 Major depressive disorder, single episode, unspecified: Secondary | ICD-10-CM | POA: Diagnosis not present

## 2019-12-30 DIAGNOSIS — C186 Malignant neoplasm of descending colon: Secondary | ICD-10-CM | POA: Diagnosis not present

## 2019-12-30 DIAGNOSIS — I10 Essential (primary) hypertension: Secondary | ICD-10-CM | POA: Diagnosis not present

## 2019-12-30 DIAGNOSIS — T451X5A Adverse effect of antineoplastic and immunosuppressive drugs, initial encounter: Secondary | ICD-10-CM | POA: Diagnosis not present

## 2019-12-30 LAB — CMP (CANCER CENTER ONLY)
ALT: 23 U/L (ref 0–44)
AST: 22 U/L (ref 15–41)
Albumin: 3.8 g/dL (ref 3.5–5.0)
Alkaline Phosphatase: 133 U/L — ABNORMAL HIGH (ref 38–126)
Anion gap: 10 (ref 5–15)
BUN: 20 mg/dL (ref 8–23)
CO2: 31 mmol/L (ref 22–32)
Calcium: 9.7 mg/dL (ref 8.9–10.3)
Chloride: 99 mmol/L (ref 98–111)
Creatinine: 1.13 mg/dL (ref 0.61–1.24)
GFR, Est AFR Am: >60 mL/min (ref >60)
GFR, Estimated: >60 mL/min (ref >60)
Glucose, Bld: 136 mg/dL — ABNORMAL HIGH (ref 70–99)
Potassium: 3.5 mmol/L (ref 3.5–5.1)
Sodium: 140 mmol/L (ref 135–145)
Total Bilirubin: 0.5 mg/dL (ref 0.3–1.2)
Total Protein: 8.1 g/dL (ref 6.5–8.1)

## 2019-12-30 LAB — CEA (IN HOUSE-CHCC): CEA (CHCC-In House): 2.24 ng/mL (ref 0.00–5.00)

## 2019-12-30 MED ORDER — GABAPENTIN 300 MG PO CAPS: 300.0000 mg | ORAL_CAPSULE | Freq: Every day | ORAL | 2 refills | Status: DC

## 2019-12-30 NOTE — Telephone Encounter (Signed)
Left normal CEA results on his voice mail.

## 2019-12-30 NOTE — Progress Notes (Signed)
[facility] OFFICE PROGRESS NOTE   Diagnosis: Colon cancer  INTERVAL HISTORY:   [name] returns for a scheduled visit.  He continues to have numbness and tingling in the hands and feet.  He otherwise feels well.  No difficulty with bowel function. Objective:  Vital signs in last 24 hours:  Blood pressure (!) 160/88, pulse (!) 56, temperature 98 F (36.7 C), temperature source Temporal, resp. rate 20, height '6\' 2"'  (1.88 m), weight 276 lb (125.2 kg), SpO2 100 %.    Lymphatics: No cervical, supraclavicular, axillary, or inguinal nodes GI: No mass, nontender, no hepatosplenomegaly Vascular: No leg edema  Lab Results:  Lab Results  Component Value Date   WBC 11.7 (H) 05/26/2019   HGB 12.8 (L) 05/26/2019   HCT 39.2 05/26/2019   MCV 96.8 05/26/2019   PLT 145 (L) 05/26/2019   NEUTROABS 8.4 (H) 05/26/2019    CMP  Lab Results  Component Value Date   NA 140 12/30/2019   K 3.5 12/30/2019   CL 99 12/30/2019   CO2 31 12/30/2019   GLUCOSE 136 (H) 12/30/2019   BUN 20 12/30/2019   CREATININE 1.13 12/30/2019   CALCIUM 9.7 12/30/2019   PROT 8.1 12/30/2019   ALBUMIN 3.8 12/30/2019   AST 22 12/30/2019   ALT 23 12/30/2019   ALKPHOS 133 (H) 12/30/2019   BILITOT 0.5 12/30/2019   GFRNONAA >60 12/30/2019   GFRAA >60 12/30/2019    Lab Results  Component Value Date   CEA1 2.12 08/23/2019     Medications: I have reviewed the patient's current medications.   Assessment/Plan:  1. Adenocarcinoma of the descending colon, stage II (T3 N0), status post a left colectomy 01/20/2017 ? MSI-stable, no loss of mismatch repair protein expression ? Elevated preoperative CEA ? Incomplete preoperative colonoscopy ? Colonoscopy 08/07/2017-multiple polyps removed including tubular adenomas ? CT abdomen/pelvis 02/05/2018-no evidence of metastatic disease. ? Elevated CEA June 2019 ? CTs 06/29/2018-no evidence of metastatic disease, stable mild bilateral iliac adenopathy ? PET  scan 1/61/0960-[id number] hypermetabolic central right liver metastasis;asymmetric hypermetabolism right palatine tonsil with associated mild asymmetric soft tissue fullness on CT images ? MRI liver 08/25/2018-2.6 cm mass in the central right liver consistent with metastasis, no other evidence of metastatic disease ? Ablation and biopsy of right liver lesion 09/29/2018-pathology revealed adenocarcinoma; foundation 1-KRAS G12V, NRAS wildtype, microsatellite stable, tumor mutational burden 1 ? Cycle 1 FOLFOX 10/25/2018 ? Cycle 2 FOLFOX 11/09/2018 ? Cycle 3 FOLFOX 11/23/2018 ? Cycle 4 FOLFOX 12/07/2018 ? Cycle 5 FOLFOX 12/21/2018 (oxaliplatin dose reduced secondary to thrombocytopenia) ? Cycle 6 FOLFOX 01/04/2019 ? Cycle 7 FOLFOX 01/18/2019 ? Cycle 8 FOLFOX 02/01/2019 ? Cycle 9 FOLFOX 02/15/2019 ? MRI abdomen 02/24/2019- ablation site in the liver without residual tumor identified. Mild intrahepatic biliary dilatation distal to the ablation site. Diffuse hepatic steatosis. No new liver lesions identified. ? Cycle 10 FOLFOX 02/28/2019 (oxaliplatin held due to neuropathy and thrombocytopenia) ? Cycle 11 FOLFOX 03/14/2019 (oxaliplatin eliminated from the regimen due to neuropathy, 5-fluorouracil dose adjusted due to diarrhea and tearing) ? Cycle 12 FOLFOX 03/28/2019 (no oxaliplatin, 5-fluorouracil as per treatment 03/14/2019) ? CTs 09/08/2019-no evidence of recurrent disease, no evidence of recurrent disease at the hepatic ablation site, stable renal cysts  2. Enlargement of the right testicle-hydrocele-Testicular ultrasound 02/26/2017-negative for mass, small bilateral hydroceles  3. Hypertension  4. Depression  5. Family history of breast and ovarian cancer, negative genetic testing  6.History of left leg edema, pain.  7.Pigmented lesion right lower leg-refer to  dermatology 06/30/2018  8. 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 is in clinical remission from colon cancer.  We will follow up on the CEA today.  He will be scheduled for a restaging abdomen CT and office visit in 2 months.  He would like to begin a trial of medical therapy for the oxaliplatin neuropathy.  He will begin gabapentin to be taken in the evening.  We we will dose escalate the gabapentin as indicated.  Betsy Coder, MD  12/30/2019  12:18 PM

## 2019-12-30 NOTE — Telephone Encounter (Signed)
-----   Message from Ladell Pier, MD sent at 12/30/2019  1:10 PM EST ----- Please call patient, cea is normal, f/u as scheduled

## 2020-01-03 ENCOUNTER — Telehealth: Payer: Self-pay | Admitting: Oncology

## 2020-01-03 NOTE — Telephone Encounter (Signed)
Scheduled per los. Called and left msg. Mailed printout  °

## 2020-01-06 ENCOUNTER — Telehealth: Payer: Self-pay | Admitting: *Deleted

## 2020-01-06 NOTE — Telephone Encounter (Signed)
Wife called to report he was only dispensed # 3 capsules of gabapentin. Informed her this was an error, he should have received #30. Instructed her to pick up the refill later today. Called pharmacy and corrected the script for #30 with #2 refills of 300 mg gabapentin.

## 2020-02-08 DIAGNOSIS — Z87891 Personal history of nicotine dependence: Secondary | ICD-10-CM | POA: Diagnosis not present

## 2020-02-08 DIAGNOSIS — Z299 Encounter for prophylactic measures, unspecified: Secondary | ICD-10-CM | POA: Diagnosis not present

## 2020-02-08 DIAGNOSIS — I10 Essential (primary) hypertension: Secondary | ICD-10-CM | POA: Diagnosis not present

## 2020-02-08 DIAGNOSIS — Z6836 Body mass index (BMI) 36.0-36.9, adult: Secondary | ICD-10-CM | POA: Diagnosis not present

## 2020-02-16 ENCOUNTER — Other Ambulatory Visit: Payer: Self-pay | Admitting: *Deleted

## 2020-02-16 ENCOUNTER — Telehealth: Payer: Self-pay | Admitting: Oncology

## 2020-02-16 DIAGNOSIS — C186 Malignant neoplasm of descending colon: Secondary | ICD-10-CM

## 2020-02-16 NOTE — Progress Notes (Signed)
Per radiology, needs lab prior to CT on 03/06/20. Order placed and scheduling message sent.

## 2020-02-16 NOTE — Telephone Encounter (Signed)
Scheduled apt per 4/1 sch message - left message with appt date and time

## 2020-03-06 ENCOUNTER — Inpatient Hospital Stay: Payer: Medicare Other | Attending: Oncology

## 2020-03-06 ENCOUNTER — Telehealth: Payer: Self-pay | Admitting: *Deleted

## 2020-03-06 ENCOUNTER — Ambulatory Visit (HOSPITAL_COMMUNITY)
Admission: RE | Admit: 2020-03-06 | Discharge: 2020-03-06 | Disposition: A | Discharge status: Home / Self Care | Payer: Medicare Other | Source: Ambulatory Visit | Attending: Oncology | Admitting: Oncology

## 2020-03-06 ENCOUNTER — Other Ambulatory Visit: Payer: Self-pay

## 2020-03-06 ENCOUNTER — Encounter (HOSPITAL_COMMUNITY): Payer: Self-pay

## 2020-03-06 DIAGNOSIS — D6959 Other secondary thrombocytopenia: Secondary | ICD-10-CM | POA: Insufficient documentation

## 2020-03-06 DIAGNOSIS — F329 Major depressive disorder, single episode, unspecified: Secondary | ICD-10-CM | POA: Insufficient documentation

## 2020-03-06 DIAGNOSIS — C186 Malignant neoplasm of descending colon: Secondary | ICD-10-CM

## 2020-03-06 DIAGNOSIS — C787 Secondary malignant neoplasm of liver and intrahepatic bile duct: Secondary | ICD-10-CM | POA: Insufficient documentation

## 2020-03-06 DIAGNOSIS — Z85038 Personal history of other malignant neoplasm of large intestine: Secondary | ICD-10-CM | POA: Diagnosis not present

## 2020-03-06 DIAGNOSIS — G620 Drug-induced polyneuropathy: Secondary | ICD-10-CM | POA: Diagnosis not present

## 2020-03-06 LAB — CMP (CANCER CENTER ONLY)
ALT: 29 U/L (ref 0–44)
AST: 36 U/L (ref 15–41)
Albumin: 3.6 g/dL (ref 3.5–5.0)
Alkaline Phosphatase: 108 U/L (ref 38–126)
Anion gap: 11 (ref 5–15)
BUN: 20 mg/dL (ref 8–23)
CO2: 31 mmol/L (ref 22–32)
Calcium: 9.6 mg/dL (ref 8.9–10.3)
Chloride: 96 mmol/L — ABNORMAL LOW (ref 98–111)
Creatinine: 1.5 mg/dL — ABNORMAL HIGH (ref 0.61–1.24)
GFR, Est AFR Am: 55 mL/min — ABNORMAL LOW (ref >60)
GFR, Estimated: 47 mL/min — ABNORMAL LOW (ref >60)
Glucose, Bld: 156 mg/dL — ABNORMAL HIGH (ref 70–99)
Potassium: 4 mmol/L (ref 3.5–5.1)
Sodium: 138 mmol/L (ref 135–145)
Total Bilirubin: 0.8 mg/dL (ref 0.3–1.2)
Total Protein: 7.4 g/dL (ref 6.5–8.1)

## 2020-03-06 MED ORDER — IOHEXOL 300 MG/ML  SOLN
100.0000 mL | Freq: Once | INTRAMUSCULAR | Status: AC | PRN
  Administered 2020-03-06: 10:00:00 100 mL via INTRAVENOUS

## 2020-03-06 MED ORDER — SODIUM CHLORIDE (PF) 0.9 % IJ SOLN
INTRAMUSCULAR | Status: AC
  Filled 2020-03-06: qty 50

## 2020-03-06 NOTE — Telephone Encounter (Signed)
Called and spoke w/wife (patient was not home). Made her aware of elevation in creatinine and per Dr. Benay Spice, hold the losartan and chlorthalidone. Will recheck chemistry tomorrow at 0830.

## 2020-03-07 ENCOUNTER — Other Ambulatory Visit: Payer: Self-pay

## 2020-03-07 ENCOUNTER — Inpatient Hospital Stay (HOSPITAL_BASED_OUTPATIENT_CLINIC_OR_DEPARTMENT_OTHER): Payer: Medicare Other | Admitting: Oncology

## 2020-03-07 ENCOUNTER — Inpatient Hospital Stay: Payer: Medicare Other

## 2020-03-07 ENCOUNTER — Telehealth: Payer: Self-pay | Admitting: *Deleted

## 2020-03-07 ENCOUNTER — Encounter (INDEPENDENT_AMBULATORY_CARE_PROVIDER_SITE_OTHER): Payer: Self-pay | Admitting: *Deleted

## 2020-03-07 VITALS — BP 161/85 | HR 60 | Temp 98.3°F | Resp 17 | Ht 74.0 in | Wt 282.8 lb

## 2020-03-07 DIAGNOSIS — C186 Malignant neoplasm of descending colon: Secondary | ICD-10-CM | POA: Diagnosis not present

## 2020-03-07 DIAGNOSIS — F329 Major depressive disorder, single episode, unspecified: Secondary | ICD-10-CM | POA: Diagnosis not present

## 2020-03-07 DIAGNOSIS — G620 Drug-induced polyneuropathy: Secondary | ICD-10-CM | POA: Diagnosis not present

## 2020-03-07 DIAGNOSIS — D6959 Other secondary thrombocytopenia: Secondary | ICD-10-CM | POA: Diagnosis not present

## 2020-03-07 DIAGNOSIS — C787 Secondary malignant neoplasm of liver and intrahepatic bile duct: Secondary | ICD-10-CM | POA: Diagnosis not present

## 2020-03-07 LAB — BASIC METABOLIC PANEL - CANCER CENTER ONLY
Anion gap: 13 (ref 5–15)
BUN: 22 mg/dL (ref 8–23)
CO2: 29 mmol/L (ref 22–32)
Calcium: 9.9 mg/dL (ref 8.9–10.3)
Chloride: 100 mmol/L (ref 98–111)
Creatinine: 1.27 mg/dL — ABNORMAL HIGH (ref 0.61–1.24)
GFR, Est AFR Am: >60 mL/min (ref >60)
GFR, Estimated: 58 mL/min — ABNORMAL LOW (ref >60)
Glucose, Bld: 161 mg/dL — ABNORMAL HIGH (ref 70–99)
Potassium: 3.9 mmol/L (ref 3.5–5.1)
Sodium: 142 mmol/L (ref 135–145)

## 2020-03-07 NOTE — Patient Instructions (Signed)
Please provide a copy of your Medical Advanced Directive to have scanned into your record 

## 2020-03-07 NOTE — Telephone Encounter (Signed)
Per Dr. Benay Spice: Creatinine is improved. May resume the chlorthalidone and losartan. Wife notified.

## 2020-03-07 NOTE — Progress Notes (Signed)
[facility] OFFICE PROGRESS NOTE   Diagnosis: Colon cancer  INTERVAL HISTORY:   Jeremy Stevenson returns as scheduled.  He feels well.  Good appetite and energy level.  No difficulty with bowel function.  He urinates frequently.  He continues to have numbness and tingling in the hands and feet.  This has not changed.  Gabapentin has not helped, but it has helped with sleep.  Objective:  Vital signs in last 24 hours:  Blood pressure (!) 161/85, pulse 60, temperature 98.3 F (36.8 C), temperature source Temporal, resp. rate 17, height '6\' 2"'  (1.88 m), weight 282 lb 12.8 oz (128.3 kg), SpO2 99 %.    HEENT: Neck without mass Lymphatics: No cervical, supraclavicular, axillary, or inguinal nodes GI: No hepatosplenomegaly, no mass, nontender Vascular: No leg edema, venous varicosities bilaterally on the left greater than right      Lab Results:  Lab Results  Component Value Date   WBC 11.7 (H) 05/26/2019   HGB 12.8 (L) 05/26/2019   HCT 39.2 05/26/2019   MCV 96.8 05/26/2019   PLT 145 (L) 05/26/2019   NEUTROABS 8.4 (H) 05/26/2019    CMP  Lab Results  Component Value Date   NA 138 03/06/2020   K 4.0 03/06/2020   CL 96 (L) 03/06/2020   CO2 31 03/06/2020   GLUCOSE 156 (H) 03/06/2020   BUN 20 03/06/2020   CREATININE 1.50 (H) 03/06/2020   CALCIUM 9.6 03/06/2020   PROT 7.4 03/06/2020   ALBUMIN 3.6 03/06/2020   AST 36 03/06/2020   ALT 29 03/06/2020   ALKPHOS 108 03/06/2020   BILITOT 0.8 03/06/2020   GFRNONAA 47 (L) 03/06/2020   GFRAA 55 (L) 03/06/2020    Lab Results  Component Value Date   CEA1 2.24 12/30/2019    Imaging:  CT ABDOMEN PELVIS W CONTRAST  Result Date: 03/06/2020 CLINICAL DATA:  69 year old male with history of metastatic colon cancer status post liver ablation. EXAM: CT ABDOMEN AND PELVIS WITH CONTRAST TECHNIQUE: Multidetector CT imaging of the abdomen and pelvis was performed using the standard protocol following bolus administration of  intravenous contrast. CONTRAST:  175m OMNIPAQUE IOHEXOL 300 MG/ML  SOLN COMPARISON:  CT the abdomen and pelvis 09/08/2019. FINDINGS: Lower chest: Atherosclerotic calcifications in the thoracic aorta. Hepatobiliary: Ill-defined hypovascular area in the central aspect of the right lobe of the liver between segments 5 and 6 (axial image 30 of series 2) measuring 2.8 x 1.6 cm, which corresponds to prior ablation site. This is associated with distal intrahepatic biliary ductal dilatation predominantly in segments 6. No new hepatic lesion otherwise noted. No extrahepatic biliary ductal dilatation. Common bile duct is normal. Pancreas: No pancreatic mass. No pancreatic ductal dilatation. No pancreatic or peripancreatic fluid collections or inflammatory changes. Spleen: Unremarkable. Adrenals/Urinary Tract: Multifocal cortical thinning in the kidneys bilaterally. Low-attenuation lesions in the left kidney compatible with simple cysts, measuring up to 1.4 cm in the upper pole the left kidney. No hydroureteronephrosis. Urinary bladder is unremarkable in appearance. Stomach/Bowel: 1.8 x 1.2 cm low-attenuation (-86 HU) lesion along the lesser curvature of the stomach, most compatible with a gastric lipoma. No pathologic dilatation of small bowel or colon. Numerous colonic diverticulae are noted, particularly in the sigmoid colon, without surrounding inflammatory changes to suggest an acute diverticulitis at this time. Normal appendix. Vascular/Lymphatic: Aortic atherosclerosis, without evidence of aneurysm or dissection in the abdominal or pelvic vasculature. No lymphadenopathy noted in the abdomen or pelvis. Reproductive: Median lobe hypertrophy in the prostate gland. Seminal vesicles  are unremarkable in appearance. Other: No significant volume of ascites.  No pneumoperitoneum. Musculoskeletal: There are no aggressive appearing lytic or blastic lesions noted in the visualized portions of the skeleton. IMPRESSION: 1. Stable  appearance of the ablation site within the right lobe of the liver with some distal intrahepatic biliary ductal dilatation. No signs of new metastatic disease in the liver or elsewhere in the abdomen or pelvis on today's examination. 2. 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 3. Colonic diverticulosis without evidence of acute diverticulitis at this time. 4. Aortic atherosclerosis. 5. Additional incidental findings, as above. Electronically Signed   By: Vinnie Langton M.D.   On: 03/06/2020 15:37    Medications: I have reviewed the patient's current medications.   Assessment/Plan: 1. Adenocarcinoma of the descending colon, stage II (T3 N0), status post a left colectomy 01/20/2017 ? MSI-stable, no loss of mismatch repair protein expression ? Elevated preoperative CEA ? Incomplete preoperative colonoscopy ? Colonoscopy 08/07/2017-multiple polyps removed including tubular adenomas ? CT abdomen/pelvis 02/05/2018-no evidence of metastatic disease. ? Elevated CEA June 2019 ? CTs 06/29/2018-no evidence of metastatic disease, stable mild bilateral iliac adenopathy ? PET scan 3/35/8251-[id number] hypermetabolic central right liver metastasis;asymmetric hypermetabolism right palatine tonsil with associated mild asymmetric soft tissue fullness on CT images ? MRI liver 08/25/2018-2.6 cm mass in the central right liver consistent with metastasis, no other evidence of metastatic disease ? Ablation and biopsy of right liver lesion 09/29/2018-pathology revealed adenocarcinoma; foundation 1-KRAS G12V, NRAS wildtype, microsatellite stable, tumor mutational burden 1 ? Cycle 1 FOLFOX 10/25/2018 ? Cycle 2 FOLFOX 11/09/2018 ? Cycle 3 FOLFOX 11/23/2018 ? Cycle 4 FOLFOX 12/07/2018 ? Cycle 5 FOLFOX 12/21/2018 (oxaliplatin dose reduced secondary to thrombocytopenia) ? Cycle 6 FOLFOX 01/04/2019 ? Cycle 7 FOLFOX 01/18/2019 ? Cycle 8 FOLFOX 02/01/2019 ? Cycle  9 FOLFOX 02/15/2019 ? MRI abdomen 02/24/2019- ablation site in the liver without residual tumor identified. Mild intrahepatic biliary dilatation distal to the ablation site. Diffuse hepatic steatosis. No new liver lesions identified. ? Cycle 10 FOLFOX 02/28/2019 (oxaliplatin held due to neuropathy and thrombocytopenia) ? Cycle 11 FOLFOX 03/14/2019 (oxaliplatin eliminated from the regimen due to neuropathy, 5-fluorouracil dose adjusted due to diarrhea and tearing) ? Cycle 12 FOLFOX 03/28/2019 (no oxaliplatin, 5-fluorouracil as per treatment 03/14/2019) ? CTs 09/08/2019-no evidence of recurrent disease, no evidence of recurrent disease at the hepatic ablation site, stable renal cysts ? CT abdomen/pelvis 03/06/2020-stable ablation site, no evidence of disease progression, stable gastric lipoma  2. Enlargement of the right testicle-hydrocele-Testicular ultrasound 02/26/2017-negative for mass, small bilateral hydroceles  3. Hypertension  4. Depression  5. Family history of breast and ovarian cancer, negative genetic testing  6.History of left leg edema, pain.  7.Pigmented lesion right lower leg-refer to dermatology 06/30/2018  8. 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  The restaging CT reveals no evidence of disease progression.  He will return for a CEA in 2 months.  He will be scheduled for restaging CTs and an office visit in 6 months.  I referred him for a surveillance colonoscopy.  The creatinine was mildly elevated yesterday.  He is scheduled for a repeat BMP today.  He will follow up with Dr. Manuella Ghazi to evaluate the polyuria.  I encouraged him to obtain the COVID-19 vaccine.  Betsy Coder, MD  03/07/2020  9:31 AM

## 2020-03-08 ENCOUNTER — Telehealth: Payer: Self-pay | Admitting: Oncology

## 2020-03-08 NOTE — Telephone Encounter (Signed)
Scheduled per los. Called and spoke with patients wife. Confirmed appt  

## 2020-03-13 ENCOUNTER — Other Ambulatory Visit: Payer: Self-pay | Admitting: Interventional Radiology

## 2020-03-13 DIAGNOSIS — C186 Malignant neoplasm of descending colon: Secondary | ICD-10-CM

## 2020-03-13 DIAGNOSIS — R160 Hepatomegaly, not elsewhere classified: Secondary | ICD-10-CM

## 2020-03-17 ENCOUNTER — Ambulatory Visit: Payer: Medicare Other | Attending: Internal Medicine

## 2020-03-17 DIAGNOSIS — Z23 Encounter for immunization: Secondary | ICD-10-CM

## 2020-03-17 NOTE — Progress Notes (Signed)
   Covid-19 Vaccination Clinic  Name:  Jeremy Stevenson    MRN: [id number] DOB: 06-25-1951  03/17/2020  [name] was observed post Covid-19 immunization for 15 minutes without incident. He was provided with Vaccine Information Sheet and instruction to access the V-Safe system.   [name] was instructed to call 911 with any severe reactions post vaccine: Marland Kitchen Difficulty breathing  . Swelling of face and throat  . A fast heartbeat  . A bad rash all over body  . Dizziness and weakness   Immunizations Administered    Name Date Dose VIS Date Route   Pfizer COVID-19 Vaccine 03/17/2020 12:14 PM 0.3 mL 01/11/2019 Intramuscular   Manufacturer: [place]   Lot: [id number]   [place]: [id number]

## 2020-03-29 ENCOUNTER — Ambulatory Visit
Admission: RE | Admit: 2020-03-29 | Discharge: 2020-03-29 | Disposition: A | Discharge status: Home / Self Care | Payer: Medicare Other | Source: Ambulatory Visit | Attending: Interventional Radiology | Admitting: Interventional Radiology

## 2020-03-29 ENCOUNTER — Encounter: Payer: Self-pay | Admitting: *Deleted

## 2020-03-29 ENCOUNTER — Other Ambulatory Visit: Payer: Self-pay

## 2020-03-29 ENCOUNTER — Other Ambulatory Visit (INDEPENDENT_AMBULATORY_CARE_PROVIDER_SITE_OTHER): Payer: Self-pay | Admitting: *Deleted

## 2020-03-29 DIAGNOSIS — R160 Hepatomegaly, not elsewhere classified: Secondary | ICD-10-CM

## 2020-03-29 DIAGNOSIS — Z85038 Personal history of other malignant neoplasm of large intestine: Secondary | ICD-10-CM | POA: Diagnosis not present

## 2020-03-29 DIAGNOSIS — C186 Malignant neoplasm of descending colon: Secondary | ICD-10-CM

## 2020-03-29 DIAGNOSIS — C787 Secondary malignant neoplasm of liver and intrahepatic bile duct: Secondary | ICD-10-CM | POA: Diagnosis not present

## 2020-03-29 HISTORY — PX: IR RADIOLOGIST EVAL & MGMT: IMG5224

## 2020-03-29 NOTE — Progress Notes (Signed)
Chief Complaint: Colon Cancer   Referring Physician(s): Dr. Shellia Carwin  History of Present Illness: Jeremy Stevenson a 69 y.o.malepresenting as a scheduled follow up to [place] today, SP treatment of single, pathologic-proven CR met to the right liver with microwave ablation.   Jeremy Stevenson joins [place] today by telephone with his wife for the interview, given the COVID situation. I confirmed ID with two identifiers.   History: He is a gentleman who was diagnosed February of 2018 with a left colon cancer. This was discovered during a work-up of lower GI bleeding. Dr. Laural Golden performed colonoscopy 01/02/2017, with mass discovered and biopsy performed. Subsequently the patient has surgery with Dr. Dalbert Batman on 01/20/2017 with left colectomy.  ________________________________ Pathology report 01/20/2017: Colon:  - Invasive adenocarcinoma, moderately differentiated spanning 6.8cm - invades pericolonic tissue - resection margins negative -heterotopic ossification -20 of 20 nodes negative for malignancy (0/20) - MSI -stable  Initial staging was stage II (T3 N0). No chemotherapy.   Interval History: We last had appointment 10/25/2019.  Since then, he tells me is doing just fine, with the exception of some lower extremity nerve symptoms.  He understands these are related to his prior chemotherapy.  He tells me that he feels the gabapentin has not been very effective.  The only other complaint he has for me is regarding left leg varicose veins and swelling.  He continues to perform all of his daily activities with no symptoms.  ECOG of 0.  His last appointment with Dr. Benay Spice was 03/07/2020.   Baseline CEA 01/16/2017 was 27.0. Post surgery on 02/13/2017 was 1.67 CEA remained below 5.0 on multiple after surgery, and was noted to be up 04/19/2018, at 8.69, then 06/21/2018 was 24.67, 2.12 08/23/2019.   His next CEA is ordered.   CT most recently performed was 03/06/2020.  This showed  evidence of remission.  No concerning features at the ablation site, with minimal persisting bile duct dilation.    Past Medical History:  Diagnosis Date  . Depression   . Genetic testing 03/16/2017   Jeremy. Stevenson underwent genetic counseling and testing for hereditary cancer syndromes on 03/03/2017. His results were negative for mutations in all 46 genes analyzed by [facility]'s Common Hereditary Cancers Panel. Genes analyzed include: APC, ATM, AXIN2, BARD1, BMPR1A, BRCA1, BRCA2, BRIP1, CDH1, CDKN2A, CHEK2, CTNNA1, DICER1, EPCAM, GREM1, HOXB13, KIT, MEN1, MLH1, MSH2, MSH3, MSH6, MUTYH, NBN, NF1, NT  . Hypertension   . 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 COLONOSCOPY N/A 01/02/2017   Procedure: COLONOSCOPY;  Surgeon: Rogene Houston, MD;  [facility];  Service: Endoscopy;  Laterality: N/A;  1000  . COLONOSCOPY N/A 08/07/2017   Procedure: COLONOSCOPY;  Surgeon: Rogene Houston, MD;  [facility];  Service: Endoscopy;  Laterality: N/A;  1045 - per Lelon Frohlich LM of new time  . HERNIA REPAIR     69 years old  . IR IMAGING GUIDED PORT INSERTION  10/13/2018  . IR RADIOLOGIST EVAL & MGMT  09/07/2018  . IR RADIOLOGIST EVAL & MGMT  11/03/2018  . IR RADIOLOGIST EVAL & MGMT  10/25/2019  . IR RADIOLOGIST EVAL & MGMT  03/29/2020  . IR REMOVAL TUN ACCESS W/ PORT W/O FL MOD SED  05/26/2019  . LAPAROSCOPIC SIGMOID COLECTOMY N/A 01/20/2017   Procedure: LAPAROSCOPIC LEFT COLECTOMY, TAKEDOWN SPLENIC FLEXURE, AND BIOPSY OF PERITONEAL NODULE;  Surgeon: Fanny Skates, MD;  Location: [facility];  Service: General;  Laterality: N/A;  . RADIOLOGY WITH ANESTHESIA N/A 09/29/2018   Procedure: CT WITH ANESTHESIA/MICROWAVE THERMAL ABLATION OF LIVER, BIOPSY;  Surgeon: Corrie Mckusick, DO;  Location: [facility];  Service: Anesthesiology;  Laterality: N/A;  . TONSILLECTOMY     69 years old    Allergies: Codeine  Medications: Prior to  Admission medications   Medication Sig Start Date End Date Taking? Authorizing Provider  amLODipine (NORVASC) 5 MG tablet Take 5 mg by mouth daily.  10/05/16   [provider]  chlorthalidone (HYGROTON) 25 MG tablet Take 25 mg by mouth daily.     [provider]  citalopram (CELEXA) 20 MG tablet Take 20 mg by mouth daily.  12/06/16   [provider]  gabapentin (NEURONTIN) 300 MG capsule Take 1 capsule (300 mg total) by mouth at bedtime. 12/30/19   Ladell Pier, MD  ibuprofen (ADVIL,MOTRIN) 200 MG tablet Take 200-400 mg by mouth as needed.    [provider]  LORazepam (ATIVAN) 0.5 MG tablet Take 1 tablet (0.5 mg total) by mouth 2 (two) times daily as needed for anxiety. 03/28/19   Owens Shark, NP  losartan (COZAAR) 100 MG tablet Take 100 mg by mouth daily.  11/29/16   [provider]  metoprolol tartrate (LOPRESSOR) 100 MG tablet Take 1 tablet (100 mg total) by mouth 2 (two) times daily. 04/26/19   Ladell Pier, MD  Naproxen Sodium (ALEVE PO) Take by mouth.    [provider]  potassium chloride SA (K-DUR,KLOR-CON) 20 MEQ tablet Take 1 tablet (20 mEq total) by mouth daily. 02/15/19   Owens Shark, NP     Family History  Problem Relation Age of Onset  . Breast cancer Mother 61  . Skin cancer Mother   . Ovarian cancer Sister 73       d.50 due to metastases  . Skin cancer Maternal Uncle   . Skin cancer Cousin 4  . Skin cancer Cousin 62    Social History   Socioeconomic History  . Marital status: Married    Spouse name: Not on file  . Number of children: Not on file  . Years of education: Not on file  . Highest education level: Not on file  Occupational History  . Not on file  Tobacco Use  . Smoking status: Never Smoker  . Smokeless tobacco: Never Used  Substance and Sexual Activity  . Alcohol use: No  . Drug use: No  . Sexual activity: Yes  Other Topics Concern  . Not on file  Social History Narrative  . Not on  file   Social Determinants of Health   Financial Resource Strain:   . Difficulty of Paying Living Expenses:   Food Insecurity:   . Worried About Charity fundraiser in the Last Year:   . Arboriculturist in the Last Year:   Transportation Needs:   . Film/video editor (Medical):   Marland Kitchen Lack of Transportation (Non-Medical):   Physical Activity:   . Days of Exercise per Week:   . Minutes of Exercise per Session:   Stress:   . Feeling of Stress :   Social Connections:   . Frequency of Communication with Friends and Family:   . Frequency of Social Gatherings with Friends and Family:   . Attends Religious Services:   . Active Member of Clubs or Organizations:   . Attends Archivist Meetings:   Marland Kitchen Marital Status:  ECOG Status: 0 - Asymptomatic  Review of Systems  Review of Systems: A 12 point ROS discussed and pertinent positives are indicated in the HPI above.  All other systems are negative.  Physical Exam No direct physical exam was performed (except for noted visual exam findings with Video Visits).   Vital Signs: There were no vitals taken for this visit.  Imaging: CT ABDOMEN PELVIS W CONTRAST  Result Date: 03/06/2020 CLINICAL DATA:  69 year old male with history of metastatic colon cancer status post liver ablation. EXAM: CT ABDOMEN AND PELVIS WITH CONTRAST TECHNIQUE: Multidetector CT imaging of the abdomen and pelvis was performed using the standard protocol following bolus administration of intravenous contrast. CONTRAST:  165m OMNIPAQUE IOHEXOL 300 MG/ML  SOLN COMPARISON:  CT the abdomen and pelvis 09/08/2019. FINDINGS: Lower chest: Atherosclerotic calcifications in the thoracic aorta. Hepatobiliary: Ill-defined hypovascular area in the central aspect of the right lobe of the liver between segments 5 and 6 (axial image 30 of series 2) measuring 2.8 x 1.6 cm, which corresponds to prior ablation site. This is associated with distal intrahepatic biliary ductal  dilatation predominantly in segments 6. No new hepatic lesion otherwise noted. No extrahepatic biliary ductal dilatation. Common bile duct is normal. Pancreas: No pancreatic mass. No pancreatic ductal dilatation. No pancreatic or peripancreatic fluid collections or inflammatory changes. Spleen: Unremarkable. Adrenals/Urinary Tract: Multifocal cortical thinning in the kidneys bilaterally. Low-attenuation lesions in the left kidney compatible with simple cysts, measuring up to 1.4 cm in the upper pole the left kidney. No hydroureteronephrosis. Urinary bladder is unremarkable in appearance. Stomach/Bowel: 1.8 x 1.2 cm low-attenuation (-86 HU) lesion along the lesser curvature of the stomach, most compatible with a gastric lipoma. No pathologic dilatation of small bowel or colon. Numerous colonic diverticulae are noted, particularly in the sigmoid colon, without surrounding inflammatory changes to suggest an acute diverticulitis at this time. Normal appendix. Vascular/Lymphatic: Aortic atherosclerosis, without evidence of aneurysm or dissection in the abdominal or pelvic vasculature. No lymphadenopathy noted in the abdomen or pelvis. Reproductive: Median lobe hypertrophy in the prostate gland. Seminal vesicles are unremarkable in appearance. Other: No significant volume of ascites.  No pneumoperitoneum. Musculoskeletal: There are no aggressive appearing lytic or blastic lesions noted in the visualized portions of the skeleton. IMPRESSION: 1. Stable appearance of the ablation site within the right lobe of the liver with some distal intrahepatic biliary ductal dilatation. No signs of new metastatic disease in the liver or elsewhere in the abdomen or pelvis on today's examination. 2. 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 3. Colonic diverticulosis without evidence of acute diverticulitis at this time. 4. Aortic atherosclerosis. 5. Additional  incidental findings, as above. Electronically Signed   By: DVinnie LangtonM.D.   On: 03/06/2020 15:37   IR Radiologist Eval & Mgmt  Result Date: 03/29/2020 Please refer to notes tab for details about interventional procedure. (Op Note)   Labs:  CBC: Recent Labs    04/26/19 0947 05/26/19 1021  WBC 10.9* 11.7*  HGB 11.6* 12.8*  HCT 35.5* 39.2  PLT 135* 145*    COAGS: Recent Labs    05/26/19 1021  INR 1.0    BMP: Recent Labs    08/23/19 0913 12/30/19 1053 03/06/20 0829 03/07/20 0834  NA 139 140 138 142  K 3.5 3.5 4.0 3.9  CL 99 99 96* 100  CO2 _0 GLUCOSE 130* 136* 156* 161*  BUN _1 22  CALCIUM 9.8 9.7 9.6 9.9  CREATININE 1.08 1.13 1.50* 1.27*  GFRNONAA >60 >60 47* 58*  GFRAA >60 >60 55* >60    LIVER FUNCTION TESTS: Recent Labs    04/26/19 0947 12/30/19 1053 03/06/20 0829  BILITOT 0.6 0.5 0.8  AST 24 22 36  ALT _0 ALKPHOS 135* 133* 108  PROT 7.5 8.1 7.4  ALBUMIN 3.4* 3.8 3.6    TUMOR MARKERS: No results for input(s): AFPTM, CEA, CA199, CHROMGRNA in the last 8760 hours.  Assessment and Plan:  Jeremy Stevenson is a 69 year old male with history of colon carcinoma and oligometastatic disease to the liver.    He was treated 09/29/2018 with microwave ablation of a single right liver lesion, in addition to his systemic therapy.    At this point, he is in remission, having finished his therapy, with normal CEA, and no concerning imaging findings.   Our discussion was regarding mainly his surveillance strategy and follow up, as well as his complaints of left leg varicose veins.  I did suggest that he could start wearing a compression stocking for support and symptom relief, which is indicated in this setting, 15-51mHg knee or thigh high.  I also encouraged him to call [place] if he wants to discuss a formal vein evaluation.  Alternatively we would discuss at his future follow up.    He is quite satisfied with his care.   Plan: -   Office visit in 6 months after next CT abd/pelvis, in concern with Dr. SGearldine Shownoffice visit.  - I have encouraged him to start wearing left compression stocking, just during the day, not at night, most days, for relief of his left leg vein symptoms.      Electronically Signed: JCorrie Mckusick5/13/2021, 9:32 AM   I spent a total of    15 Minutes in remote  clinical consultation, greater than 50% of which was counseling/coordinating care for metastatic colon cancer, SP microwave ablation, remission.    Visit type: Audio only (telephone). Audio (no video) only due to patient's lack of internet/smartphone capability. Alternative [facility] [address] [place] This visit type was conducted due to national recommendations for restrictions regarding the COVID-19 Pandemic (e.g. social distancing).  This format is felt to be most appropriate for this patient at this time.  All issues noted in this document were discussed and addressed.

## 2020-03-30 ENCOUNTER — Other Ambulatory Visit (INDEPENDENT_AMBULATORY_CARE_PROVIDER_SITE_OTHER): Payer: Self-pay | Admitting: *Deleted

## 2020-03-30 DIAGNOSIS — Z85038 Personal history of other malignant neoplasm of large intestine: Secondary | ICD-10-CM

## 2020-04-04 ENCOUNTER — Encounter (INDEPENDENT_AMBULATORY_CARE_PROVIDER_SITE_OTHER): Payer: Self-pay | Admitting: *Deleted

## 2020-04-04 ENCOUNTER — Telehealth (INDEPENDENT_AMBULATORY_CARE_PROVIDER_SITE_OTHER): Payer: Self-pay | Admitting: *Deleted

## 2020-04-04 NOTE — Telephone Encounter (Signed)
Referring MD: sherrill PCP: shah   Procedure: tcs  Reason/Indication:  Hx colon ca  Has patient had this procedure before?  Yes, 2019  If so, when, by whom and where?    Is there a family history of colon cancer?  no  Who?  What age when diagnosed?    Is patient diabetic?   no      Does patient have prosthetic heart valve or mechanical valve?  no  Do you have a pacemaker/defibrillator?  no  Has patient ever had endocarditis/atrial fibrillation? no  Does patient use oxygen? no  Has patient had joint replacement within last 12 months?  no  Is patient constipated or do they take laxatives? no  Does patient have a history of alcohol/drug use?  no  Is patient on blood thinner such as Coumadin, Plavix and/or Aspirin? no  Medications: chlorthalidone 25 mg daily, citalopram 20 mg daily, losartan 100 mg daily, amlodipine 5 mg daily, gabapentin 300 mg daily, lorazepam 0.5 mg prn, metoprolol 100 mg bid  Allergies: codeine  Medication Adjustment per Dr Laural Golden:   Procedure date & time: 05/03/20

## 2020-04-05 ENCOUNTER — Ambulatory Visit (INDEPENDENT_AMBULATORY_CARE_PROVIDER_SITE_OTHER): Payer: Self-pay

## 2020-04-05 ENCOUNTER — Other Ambulatory Visit: Payer: Self-pay

## 2020-04-06 ENCOUNTER — Telehealth (INDEPENDENT_AMBULATORY_CARE_PROVIDER_SITE_OTHER): Payer: Self-pay | Admitting: *Deleted

## 2020-04-06 NOTE — Telephone Encounter (Signed)
Ok to schedule.

## 2020-04-06 NOTE — Telephone Encounter (Signed)
Patient needs Plenvu (copay card) ° °

## 2020-04-09 MED ORDER — PLENVU 140 G PO SOLR: 1.0000 | Freq: Once | ORAL | 0 refills | Status: AC

## 2020-04-21 ENCOUNTER — Ambulatory Visit: Payer: Medicare Other

## 2020-04-26 ENCOUNTER — Ambulatory Visit: Payer: Self-pay | Attending: Internal Medicine

## 2020-04-26 DIAGNOSIS — Z23 Encounter for immunization: Secondary | ICD-10-CM

## 2020-04-26 NOTE — Progress Notes (Signed)
   Covid-19 Vaccination Clinic  Name:  Jeremy Stevenson    MRN: [id number] DOB: 1951/05/06  04/26/2020  Jeremy Stevenson was observed post Covid-19 immunization for 15 minutes without incident. He was provided with Vaccine Information Sheet and instruction to access the V-Safe system.   Jeremy Stevenson was instructed to call 911 with any severe reactions post vaccine: Marland Kitchen Difficulty breathing  . Swelling of face and throat  . A fast heartbeat  . A bad rash all over body  . Dizziness and weakness   Immunizations Administered    Name Date Dose VIS Date Route   Moderna COVID-19 Vaccine 03/17/2020 12:14 PM 0.5 mL 10/2019 Intramuscular   Manufacturer: Levan Hurst   Lot: [id number]   [place]: [id number]      Incorrect documentation of [place] below, patient received Moderna as noted on 03/17/20.

## 2020-04-26 NOTE — Progress Notes (Signed)
   Covid-19 Vaccination Clinic  Name:  Cledis S Martinique    MRN: [id number] DOB: 19-Jan-1951  04/26/2020  Mr. Martinique was observed post Covid-19 immunization for 15 minutes without incident. He was provided with Vaccine Information Sheet and instruction to access the V-Safe system.   Mr. Martinique was instructed to call 911 with any severe reactions post vaccine: Marland Kitchen Difficulty breathing  . Swelling of face and throat  . A fast heartbeat  . A bad rash all over body  . Dizziness and weakness   Immunizations Administered    Name Date Dose VIS Date Route   Moderna COVID-19 Vaccine 04/26/2020 10:26 AM 0.5 mL 10/2019 Intramuscular   Manufacturer: Moderna   Lot: [id number]   [place]: [phone]

## 2020-04-26 NOTE — Progress Notes (Signed)
This patient received Moderna and not [employer]. Will edit the administration for the correct vaccine.

## 2020-05-01 ENCOUNTER — Other Ambulatory Visit: Payer: Self-pay

## 2020-05-01 ENCOUNTER — Other Ambulatory Visit (HOSPITAL_COMMUNITY)
Admission: RE | Admit: 2020-05-01 | Discharge: 2020-05-01 | Disposition: A | Discharge status: Home / Self Care | Payer: Medicare Other | Source: Ambulatory Visit | Attending: Internal Medicine | Admitting: Internal Medicine

## 2020-05-01 DIAGNOSIS — Z20822 Contact with and (suspected) exposure to covid-19: Secondary | ICD-10-CM | POA: Diagnosis not present

## 2020-05-01 DIAGNOSIS — Z01812 Encounter for preprocedural laboratory examination: Secondary | ICD-10-CM | POA: Insufficient documentation

## 2020-05-01 LAB — SARS CORONAVIRUS 2 (TAT 6-24 HRS): SARS Coronavirus 2: NEGATIVE

## 2020-05-03 ENCOUNTER — Encounter (HOSPITAL_COMMUNITY): Payer: Self-pay | Admitting: Internal Medicine

## 2020-05-03 ENCOUNTER — Other Ambulatory Visit: Payer: Self-pay

## 2020-05-03 ENCOUNTER — Ambulatory Visit (HOSPITAL_COMMUNITY)
Admission: RE | Admit: 2020-05-03 | Discharge: 2020-05-03 | Disposition: A | Discharge status: Home / Self Care | Payer: Medicare Other | Source: Ambulatory Visit | Attending: Internal Medicine | Admitting: Internal Medicine

## 2020-05-03 ENCOUNTER — Encounter (HOSPITAL_COMMUNITY)
Admission: RE | Disposition: A | Discharge status: Home / Self Care | Payer: Self-pay | Source: Ambulatory Visit | Attending: Internal Medicine

## 2020-05-03 DIAGNOSIS — Z803 Family history of malignant neoplasm of breast: Secondary | ICD-10-CM | POA: Diagnosis not present

## 2020-05-03 DIAGNOSIS — K529 Noninfective gastroenteritis and colitis, unspecified: Secondary | ICD-10-CM | POA: Diagnosis not present

## 2020-05-03 DIAGNOSIS — F329 Major depressive disorder, single episode, unspecified: Secondary | ICD-10-CM | POA: Insufficient documentation

## 2020-05-03 DIAGNOSIS — Z8041 Family history of malignant neoplasm of ovary: Secondary | ICD-10-CM | POA: Diagnosis not present

## 2020-05-03 DIAGNOSIS — D175 Benign lipomatous neoplasm of intra-abdominal organs: Secondary | ICD-10-CM | POA: Diagnosis not present

## 2020-05-03 DIAGNOSIS — Z08 Encounter for follow-up examination after completed treatment for malignant neoplasm: Secondary | ICD-10-CM | POA: Diagnosis not present

## 2020-05-03 DIAGNOSIS — Z980 Intestinal bypass and anastomosis status: Secondary | ICD-10-CM | POA: Insufficient documentation

## 2020-05-03 DIAGNOSIS — K635 Polyp of colon: Secondary | ICD-10-CM | POA: Insufficient documentation

## 2020-05-03 DIAGNOSIS — I10 Essential (primary) hypertension: Secondary | ICD-10-CM | POA: Diagnosis not present

## 2020-05-03 DIAGNOSIS — K5730 Diverticulosis of large intestine without perforation or abscess without bleeding: Secondary | ICD-10-CM

## 2020-05-03 DIAGNOSIS — Z808 Family history of malignant neoplasm of other organs or systems: Secondary | ICD-10-CM | POA: Diagnosis not present

## 2020-05-03 DIAGNOSIS — D120 Benign neoplasm of cecum: Secondary | ICD-10-CM | POA: Diagnosis not present

## 2020-05-03 DIAGNOSIS — Z85038 Personal history of other malignant neoplasm of large intestine: Secondary | ICD-10-CM

## 2020-05-03 DIAGNOSIS — Z79899 Other long term (current) drug therapy: Secondary | ICD-10-CM | POA: Diagnosis not present

## 2020-05-03 DIAGNOSIS — K6389 Other specified diseases of intestine: Secondary | ICD-10-CM | POA: Diagnosis not present

## 2020-05-03 DIAGNOSIS — Z9049 Acquired absence of other specified parts of digestive tract: Secondary | ICD-10-CM | POA: Diagnosis not present

## 2020-05-03 DIAGNOSIS — Z1211 Encounter for screening for malignant neoplasm of colon: Secondary | ICD-10-CM | POA: Insufficient documentation

## 2020-05-03 DIAGNOSIS — Z9221 Personal history of antineoplastic chemotherapy: Secondary | ICD-10-CM | POA: Diagnosis not present

## 2020-05-03 DIAGNOSIS — Z86010 Personal history of colonic polyps: Secondary | ICD-10-CM | POA: Insufficient documentation

## 2020-05-03 DIAGNOSIS — Z885 Allergy status to narcotic agent status: Secondary | ICD-10-CM | POA: Insufficient documentation

## 2020-05-03 HISTORY — PX: BIOPSY: SHX5522

## 2020-05-03 HISTORY — DX: Polyneuropathy, unspecified: G62.9

## 2020-05-03 HISTORY — PX: COLONOSCOPY: SHX5424

## 2020-05-03 HISTORY — PX: POLYPECTOMY: SHX5525

## 2020-05-03 SURGERY — COLONOSCOPY
Anesthesia: Moderate Sedation

## 2020-05-03 MED ORDER — MIDAZOLAM HCL 5 MG/5ML IJ SOLN
INTRAMUSCULAR | Status: DC | PRN
  Administered 2020-05-03 (×2): 2 mg via INTRAVENOUS
  Administered 2020-05-03: 1 mg via INTRAVENOUS

## 2020-05-03 MED ORDER — SODIUM CHLORIDE 0.9 % IV SOLN: INTRAVENOUS | Status: DC

## 2020-05-03 MED ORDER — MEPERIDINE HCL 50 MG/ML IJ SOLN
INTRAMUSCULAR | Status: DC | PRN
  Administered 2020-05-03 (×2): 25 mg

## 2020-05-03 MED ORDER — MEPERIDINE HCL 50 MG/ML IJ SOLN
INTRAMUSCULAR | Status: AC
  Filled 2020-05-03: qty 1

## 2020-05-03 MED ORDER — MIDAZOLAM HCL 5 MG/5ML IJ SOLN
INTRAMUSCULAR | Status: AC
  Filled 2020-05-03: qty 10

## 2020-05-03 NOTE — H&P (Signed)
Jeremy Stevenson is an 69 y.o. male.   Chief Complaint: Patient is here for colonoscopy. HPI: Patient is 69 year old Caucasian male who has a history of colon carcinoma.  He underwent laparoscopic sigmoid colectomy and March 2018.  He required adjuvant chemotherapy.  His last colonoscopy was in September 2018 with removal of 4 polyps.  One polyp was 12 mm.  2 of the polyps are tubular adenomas and 2 were sessile serrated polyp.  He is returning for surveillance colonoscopy.  He states he is doing well other than paresthesias in his hands and feet.  He has no difficulty walking.  He denies abdominal pain change in bowel habits or rectal bleeding. Family history is negative for CRC.  Mother was diagnosed with breast carcinoma in her 67s and lived to be 105.  Sister was diagnosed with ovarian cancer at age 45 and died 2 years later.  Past Medical History:  Diagnosis Date  . Depression   . Genetic testing 03/16/2017   [name] underwent genetic counseling and testing for hereditary cancer syndromes on 03/03/2017. His results were negative for mutations in all 46 genes analyzed by [facility]'s Common Hereditary Cancers Panel. Genes analyzed include: APC, ATM, AXIN2, BARD1, BMPR1A, BRCA1, BRCA2, BRIP1, CDH1, CDKN2A, CHEK2, CTNNA1, DICER1, EPCAM, GREM1, HOXB13, KIT, MEN1, MLH1, MSH2, MSH3, MSH6, MUTYH, NBN, NF1, NT  . Hypertension   . Neoplasm of uncertain behavior of descending colon 01/20/2017  . Peripheral neuropathy     Past Surgical History:  Procedure Laterality Date  . COLONOSCOPY N/A 01/02/2017   Procedure: COLONOSCOPY;  Surgeon: Rogene Houston, MD;  [facility];  Service: Endoscopy;  Laterality: N/A;  1000  . COLONOSCOPY N/A 08/07/2017   Procedure: COLONOSCOPY;  Surgeon: Rogene Houston, MD;  [facility];  Service: Endoscopy;  Laterality: N/A;  1045 - per Lelon Frohlich LM of new time  . HERNIA REPAIR     69 years old  . IR IMAGING GUIDED PORT INSERTION  10/13/2018  . IR RADIOLOGIST  EVAL & MGMT  09/07/2018  . IR RADIOLOGIST EVAL & MGMT  11/03/2018  . IR RADIOLOGIST EVAL & MGMT  10/25/2019  . IR RADIOLOGIST EVAL & MGMT  03/29/2020  . IR REMOVAL TUN ACCESS W/ PORT W/O FL MOD SED  05/26/2019  . LAPAROSCOPIC SIGMOID COLECTOMY N/A 01/20/2017   Procedure: LAPAROSCOPIC LEFT COLECTOMY, TAKEDOWN SPLENIC FLEXURE, AND BIOPSY OF PERITONEAL NODULE;  Surgeon: Fanny Skates, MD;  Location: [facility];  Service: General;  Laterality: N/A;  . RADIOLOGY WITH ANESTHESIA N/A 09/29/2018   Procedure: CT WITH ANESTHESIA/MICROWAVE THERMAL ABLATION OF LIVER, BIOPSY;  Surgeon: Corrie Mckusick, DO;  Location: [facility];  Service: Anesthesiology;  Laterality: N/A;  . TONSILLECTOMY     69 years old    Family History  Problem Relation Age of Onset  . Breast cancer Mother 28  . Skin cancer Mother   . Ovarian cancer Sister 96       d.50 due to metastases  . Skin cancer Maternal Uncle   . Skin cancer Cousin 91  . Skin cancer Cousin 64   Social History:  reports that he has never smoked. He has never used smokeless tobacco. He reports that he does not drink alcohol and does not use drugs.  Allergies:  Allergies  Allergen Reactions  . Codeine Nausea Only    Medications Prior to Admission  Medication Sig Dispense Refill  . amLODipine (NORVASC) 5 MG tablet Take 5 mg by mouth daily.     Marland Kitchen  chlorthalidone (HYGROTON) 25 MG tablet Take 25 mg by mouth daily.     . citalopram (CELEXA) 20 MG tablet Take 20 mg by mouth daily.     Marland Kitchen gabapentin (NEURONTIN) 300 MG capsule Take 1 capsule (300 mg total) by mouth at bedtime. 30 capsule 2  . LORazepam (ATIVAN) 0.5 MG tablet Take 1 tablet (0.5 mg total) by mouth 2 (two) times daily as needed for anxiety. 60 tablet 0  . losartan (COZAAR) 100 MG tablet Take 100 mg by mouth daily.     . metoprolol tartrate (LOPRESSOR) 100 MG tablet Take 1 tablet (100 mg total) by mouth 2 (two) times daily. 60 tablet 1  . potassium chloride SA (K-DUR,KLOR-CON) 20 MEQ tablet Take 1 tablet (20  mEq total) by mouth daily. 30 tablet 1  . ibuprofen (ADVIL,MOTRIN) 200 MG tablet Take 200-400 mg by mouth every 8 (eight) hours as needed for moderate pain.       Results for orders placed or performed during the hospital encounter of 05/01/20 (from the past 48 hour(s))  SARS CORONAVIRUS 2 (TAT 6-24 HRS) Nasopharyngeal Nasopharyngeal Swab     Status: None   Collection Time: 05/01/20  9:00 AM   Specimen: Nasopharyngeal Swab  Result Value Ref Range   SARS Coronavirus 2 NEGATIVE NEGATIVE    Comment: (NOTE) SARS-CoV-2 target nucleic acids are NOT DETECTED.  The SARS-CoV-2 RNA is generally detectable in upper and lower respiratory specimens during the acute phase of infection. Negative results do not preclude SARS-CoV-2 infection, do not rule out co-infections with other pathogens, and should not be used as the sole basis for treatment or other patient management decisions. Negative results must be combined with clinical observations, patient history, and epidemiological information. The expected result is Negative.  Fact Sheet for Patients: SugarRoll.be  Fact Sheet for Healthcare Providers: https://www.woods-mathews.com/  This test is not yet approved or cleared by the [place] FDA and  has been authorized for detection and/or diagnosis of SARS-CoV-2 by FDA under an Emergency Use Authorization (EUA). This EUA will remain  in effect (meaning this test can be used) for the duration of the COVID-19 declaration under Se ction 564(b)(1) of the Act, 21 U.S.C. section 360bbb-3(b)(1), unless the authorization is terminated or revoked sooner.  Performed at [facility], [place] [address], [place], [place] [address]    No results found.  Review of Systems  Blood pressure (!) 186/101, pulse 75, temperature 97.8 F (36.6 C), temperature source Oral, resp. rate 20, height _0  (1.88 m), weight 127 kg, SpO2 99 %. Physical Exam  HENT:   Mouth/Throat: Mucous membranes are moist. Oropharynx is clear.  Eyes: Conjunctivae are normal. No scleral icterus.  Cardiovascular: Normal rate and regular rhythm.  Murmur heard. Grade 2/6 systolic ejection murmur best heard at aortic area.  Respiratory: Effort normal and breath sounds normal.  GI:  Abdomen is full.  He has midline scar.  On palpation it soft and nontender with organomegaly or masses.  Musculoskeletal:        General: No swelling.     Cervical back: Normal range of motion.  Lymphadenopathy:    He has no cervical adenopathy.  Neurological: He is alert.  Skin: Skin is warm and dry.     Assessment/Plan History of colon carcinoma and colonic adenomas and sessile serrated polyps. Surveillance colonoscopy.  Hildred Laser, MD 05/03/2020, 8:36 AM

## 2020-05-03 NOTE — Op Note (Signed)
[facility] Patient Name: Jeremy Stevenson Procedure Date: 05/03/2020 8:18 AM MRN: [id number] Date of Birth: 23-Jun-1951 Attending MD: Hildred Laser , MD CSN: [id number] Age: 69 Admit Type: Outpatient Procedure:                Colonoscopy Indications:              High risk colon cancer surveillance: Personal                            history of colonic polyps, High risk colon cancer                            surveillance: Personal history of colon cancer Providers:                Hildred Laser, MD, Crystal Page, Lurline Del, RN Referring MD:             Izola Price. Benay Spice, MD and Monico Blitz, MD Medicines:                Meperidine 50 mg IV, Midazolam 5 mg IV Complications:            No immediate complications. Estimated Blood Loss:     Estimated blood loss was minimal. Procedure:                Pre-Anesthesia Assessment:                           - Prior to the procedure, a History and Physical                            was performed, and patient medications and                            allergies were reviewed. The patient's tolerance of                            previous anesthesia was also reviewed. The risks                            and benefits of the procedure and the sedation                            options and risks were discussed with the patient.                            All questions were answered, and informed consent                            was obtained. Prior Anticoagulants: The patient has                            taken no previous anticoagulant or antiplatelet                            agents except for NSAID medication. ASA Grade  Assessment: II - A patient with mild systemic                            disease. After reviewing the risks and benefits,                            the patient was deemed in satisfactory condition to                            undergo the procedure.                           After obtaining informed  consent, the colonoscope                            was passed under direct vision. Throughout the                            procedure, the patient's blood pressure, pulse, and                            oxygen saturations were monitored continuously. The                            PCF-H190DL ([id number]) scope was introduced through                            the anus and advanced to the the terminal ileum,                            with identification of the appendiceal orifice and                            IC valve. The colonoscopy was performed without                            difficulty. The patient tolerated the procedure                            well. The quality of the bowel preparation was                            adequate to identify polyps. The ileocecal valve,                            appendiceal orifice, and rectum were photographed. Scope In: 8:48:19 AM Scope Out: 9:09:44 AM Scope Withdrawal Time: 0 hours 17 minutes 59 seconds  Total Procedure Duration: 0 hours 21 minutes 25 seconds  Findings:      The perianal and digital rectal examinations were normal.      The terminal ileum appeared normal.      A small polyp was found in the cecum. The polyp was sessile. Biopsies       were taken with a cold forceps for histology. The pathology specimen  was       placed into Bottle Number 1.      There was evidence of a prior end-to-end colo-colonic anastomosis in the       descending colon. This was patent and was characterized by edema erosion       and friable mucosa. This was biopsied with a cold forceps for histology.       The pathology specimen was placed into Bottle Number 2.      Scattered diverticula were found in the entire colon.      The retroflexed view of the distal rectum and anal verge was normal and       showed no anal or rectal abnormalities.      There was a medium-sized lipoma, at the hepatic flexure. Impression:               - The examined portion of  the ileum was normal.                           - One small polyp in the cecum. Biopsied.                           - Patent end-to-end colo-colonic anastomosis,                            characterized by edema erosion and friable mucosa.                            Biopsied.                           - Diverticulosis in the entire examined colon.                           comment: hepatic lipoma stable since last                            colonoscopy of September, 2018.                           focal changes at anastamosis secondary to suture                            reaction. Moderate Sedation:      Moderate (conscious) sedation was administered by the endoscopy nurse       and supervised by the endoscopist. The following parameters were       monitored: oxygen saturation, heart rate, blood pressure, CO2       capnography and response to care. Total physician intraservice time was       26 minutes. Recommendation:           - Patient has a contact number available for                            emergencies. The signs and symptoms of potential                            delayed complications were discussed with the  patient. Return to normal activities tomorrow.                            Written discharge instructions were provided to the                            patient.                           - High fiber diet today.                           - Continue present medications.                           - No aspirin, ibuprofen, naproxen, or other                            non-steroidal anti-inflammatory drugs for 1 day.                           - Await pathology results.                           - Repeat colonoscopy is recommended for                            surveillance. The colonoscopy date will be                            determined after pathology results from today's                            exam become available for review. Procedure  Code(s):        --- Professional ---                           [phone], Colonoscopy, flexible; with biopsy, single                            or multiple                           99153, Moderate sedation; each additional 15                            minutes intraservice time                           G0500, Moderate sedation services provided by the                            same physician or other qualified health care                            professional performing a gastrointestinal  endoscopic service that sedation supports,                            requiring the presence of an independent trained                            observer to assist in the monitoring of the                            patient's level of consciousness and physiological                            status; initial 15 minutes of intra-service time;                            patient age 77 years or older (additional time may                            be reported with [phone], as appropriate) Diagnosis Code(s):        --- Professional ---                           Z86.010, Personal history of colonic polyps                           Z85.038, Personal history of other malignant                            neoplasm of large intestine                           K63.5, Polyp of colon                           Z98.0, Intestinal bypass and anastomosis status                           K57.30, Diverticulosis of large intestine without                            perforation or abscess without bleeding CPT copyright 2019 American [employer]. All rights reserved. The codes documented in this report are preliminary and upon coder review may  be revised to meet current compliance requirements. Hildred Laser, MD Hildred Laser, MD 05/03/2020 9:24:15 AM This report has been signed electronically. Number of Addenda: 0

## 2020-05-03 NOTE — Discharge Instructions (Signed)
No aspirin or NSAIDs for 24 hours. Resume usual medications as before High-fiber diet No driving for 24 hours. Physician will call with biopsy results.     Colonoscopy, Adult, Care After This sheet gives you information about how to care for yourself after your procedure. Your doctor may also give you more specific instructions. If you have problems or questions, call your doctor. What can I expect after the procedure? After the procedure, it is common to have:  A small amount of blood in your poop (stool) for 24 hours.  Some gas.  Mild cramping or bloating in your belly (abdomen). Follow these instructions at home: Eating and drinking   Drink enough fluid to keep your pee (urine) pale yellow.  Follow instructions from your doctor about what you cannot eat or drink.  Return to your normal diet as told by your doctor. Avoid heavy or fried foods that are hard to digest. Activity  Rest as told by your doctor.  Do not sit for a long time without moving. Get up to take short walks every 1-2 hours. This is important. Ask for help if you feel weak or unsteady.  Return to your normal activities as told by your doctor. Ask your doctor what activities are safe for you. To help cramping and bloating:   Try walking around.  Put heat on your belly as told by your doctor. Use the heat source that your doctor recommends, such as a moist heat pack or a heating pad. ? Put a towel between your skin and the heat source. ? Leave the heat on for 20-30 minutes. ? Remove the heat if your skin turns bright red. This is very important if you are unable to feel pain, heat, or cold. You may have a greater risk of getting burned. General instructions  For the first 24 hours after the procedure: ? Do not drive or use machinery. ? Do not sign important documents. ? Do not drink alcohol. ? Do your daily activities more slowly than normal. ? Eat foods that are soft and easy to digest.  Take  over-the-counter or prescription medicines only as told by your doctor.  Keep all follow-up visits as told by your doctor. This is important. Contact a doctor if:  You have blood in your poop 2-3 days after the procedure. Get help right away if:  You have more than a small amount of blood in your poop.  You see large clumps of tissue (blood clots) in your poop.  Your belly is swollen.  You feel like you may vomit (nauseous).  You vomit.  You have a fever.  You have belly pain that gets worse, and medicine does not help your pain. Summary  After the procedure, it is common to have a small amount of blood in your poop. You may also have mild cramping and bloating in your belly.  For the first 24 hours after the procedure, do not drive or use machinery, do not sign important documents, and do not drink alcohol.  Get help right away if you have a lot of blood in your poop, feel like you may vomit, have a fever, or have more belly pain. This information is not intended to replace advice given to you by your health care provider. Make sure you discuss any questions you have with your health care provider. Document Revised: 05/30/2019 Document Reviewed: 05/30/2019 Elsevier Patient Education  2020 [employer].     High-Fiber Diet Fiber, also called dietary fiber,   is a type of carbohydrate that is found in fruits, vegetables, whole grains, and beans. A high-fiber diet can have many health benefits. Your health care provider may recommend a high-fiber diet to help:  Prevent constipation. Fiber can make your bowel movements more regular.  Lower your cholesterol.  Relieve the following conditions: ? Swelling of veins in the anus (hemorrhoids). ? Swelling and irritation (inflammation) of specific areas of the digestive tract (uncomplicated diverticulosis). ? A problem of the large intestine (colon) that sometimes causes pain and diarrhea (irritable bowel syndrome, IBS).  Prevent  overeating as part of a weight-loss plan.  Prevent heart disease, type 2 diabetes, and certain cancers. What is my plan? The recommended daily fiber intake in grams (g) includes:  38 g for men age 50 or younger.  30 g for men over age 50.  25 g for women age 50 or younger.  21 g for women over age 50. You can get the recommended daily intake of dietary fiber by:  Eating a variety of fruits, vegetables, grains, and beans.  Taking a fiber supplement, if it is not possible to get enough fiber through your diet. What do I need to know about a high-fiber diet?  It is better to get fiber through food sources rather than from fiber supplements. There is not a lot of research about how effective supplements are.  Always check the fiber content on the nutrition facts label of any prepackaged food. Look for foods that contain 5 g of fiber or more per serving.  Talk with a diet and nutrition specialist (dietitian) if you have questions about specific foods that are recommended or not recommended for your medical condition, especially if those foods are not listed below.  Gradually increase how much fiber you consume. If you increase your intake of dietary fiber too quickly, you may have bloating, cramping, or gas.  Drink plenty of water. Water helps you to digest fiber. What are tips for following this plan?  Eat a wide variety of high-fiber foods.  Make sure that half of the grains that you eat each day are whole grains.  Eat breads and cereals that are made with whole-grain flour instead of refined flour or white flour.  Eat brown rice, bulgur wheat, or millet instead of white rice.  Start the day with a breakfast that is high in fiber, such as a cereal that contains 5 g of fiber or more per serving.  Use beans in place of meat in soups, salads, and pasta dishes.  Eat high-fiber snacks, such as berries, raw vegetables, nuts, and popcorn.  Choose whole fruits and vegetables instead  of processed forms like juice or sauce. What foods can I eat?  Fruits Berries. Pears. Apples. Oranges. Avocado. Prunes and raisins. Dried figs. Vegetables Sweet potatoes. Spinach. Kale. Artichokes. Cabbage. Broccoli. Cauliflower. Green peas. Carrots. Squash. Grains Whole-grain breads. Multigrain cereal. Oats and oatmeal. Brown rice. Barley. Bulgur wheat. Millet. Quinoa. Bran muffins. Popcorn. Rye wafer crackers. Meats and other proteins Navy, kidney, and pinto beans. Soybeans. Split peas. Lentils. Nuts and seeds. Dairy Fiber-fortified yogurt. Beverages Fiber-fortified soy milk. Fiber-fortified orange juice. Other foods Fiber bars. The items listed above may not be a complete list of recommended foods and beverages. Contact a dietitian for more options. What foods are not recommended? Fruits Fruit juice. Cooked, strained fruit. Vegetables Fried potatoes. Canned vegetables. Well-cooked vegetables. Grains White bread. Pasta made with refined flour. White rice. Meats and other proteins Fatty cuts of meat. Fried   chicken or fried fish. Dairy Milk. Yogurt. Cream cheese. Sour cream. Fats and oils Butters. Beverages Soft drinks. Other foods Cakes and pastries. The items listed above may not be a complete list of foods and beverages to avoid. Contact a dietitian for more information. Summary  Fiber is a type of carbohydrate. It is found in fruits, vegetables, whole grains, and beans.  There are many health benefits of eating a high-fiber diet, such as preventing constipation, lowering blood cholesterol, helping with weight loss, and reducing your risk of heart disease, diabetes, and certain cancers.  Gradually increase your intake of fiber. Increasing too fast can result in cramping, bloating, and gas. Drink plenty of water while you increase your fiber.  The best sources of fiber include whole fruits and vegetables, whole grains, nuts, seeds, and beans. This information is not  intended to replace advice given to you by your health care provider. Make sure you discuss any questions you have with your health care provider. Document Revised: 09/07/2017 Document Reviewed: 09/07/2017 Elsevier Patient Education  2020 Elsevier Inc.  

## 2020-05-04 LAB — SURGICAL PATHOLOGY

## 2020-05-07 ENCOUNTER — Telehealth: Payer: Self-pay | Admitting: *Deleted

## 2020-05-07 ENCOUNTER — Inpatient Hospital Stay: Payer: Medicare Other | Attending: Oncology

## 2020-05-07 ENCOUNTER — Other Ambulatory Visit: Payer: Self-pay

## 2020-05-07 DIAGNOSIS — C186 Malignant neoplasm of descending colon: Secondary | ICD-10-CM | POA: Insufficient documentation

## 2020-05-07 LAB — CEA (IN HOUSE-CHCC): CEA (CHCC-In House): 3.14 ng/mL (ref 0.00–5.00)

## 2020-05-07 NOTE — Telephone Encounter (Signed)
-----   Message from Ladell Pier, MD sent at 05/07/2020  1:35 PM EDT ----- Please call patient, the CEA is normal, follow-up as scheduled

## 2020-05-07 NOTE — Telephone Encounter (Signed)
Notified of normal CEA and to f/u as scheduled.  

## 2020-05-08 ENCOUNTER — Encounter (HOSPITAL_COMMUNITY): Payer: Self-pay | Admitting: Internal Medicine

## 2020-05-28 IMAGING — CT CT BIOPSY
1 of 16 series · 2 of 40 positions shown, 3 images · non-contrast
Comparison: MRI 08/25/2018

INDICATION: 67-year-old male with a history of colorectal carcinoma and a new
right liver lesion representing oligometastatic disease. He presents
today for tissue ablation and biopsy

EXAM:
IMAGE GUIDED BIOPSY LIVER LESION.
IMAGE GUIDED TISSUE ABLATION WITH MICROWAVE TECHNOLOGY
TECHNIQUE: Informed written consent was obtained from the patient after a
thorough discussion of the procedural risks, benefits and
alternatives. All questions were addressed. Maximal Sterile Barrier
Technique was utilized including caps, mask, sterile gowns, sterile
gloves, sterile drape, hand hygiene and skin antiseptic. A timeout
was performed prior to the initiation of the procedure.

[Series 2: i-spiral 2.0 b31f · axial · 0.98mm/px · z∈[-48,-38]mm · 2 of 79 slices shown, 3 images]
[im 35/79  mediastinal]
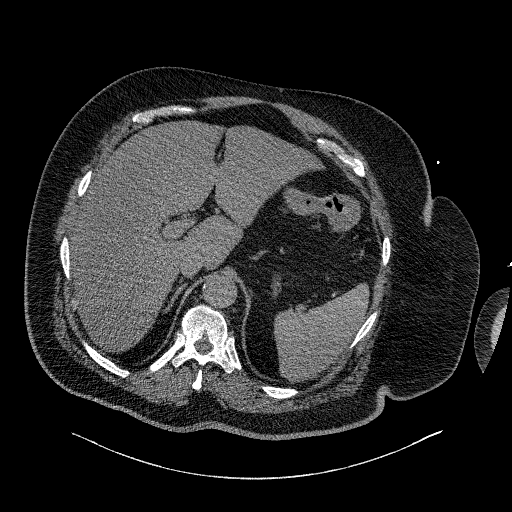
[im 35/79  lung]
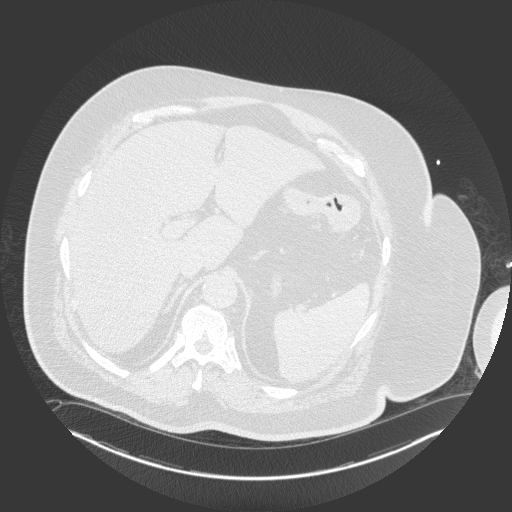
[im 40/79  lung]
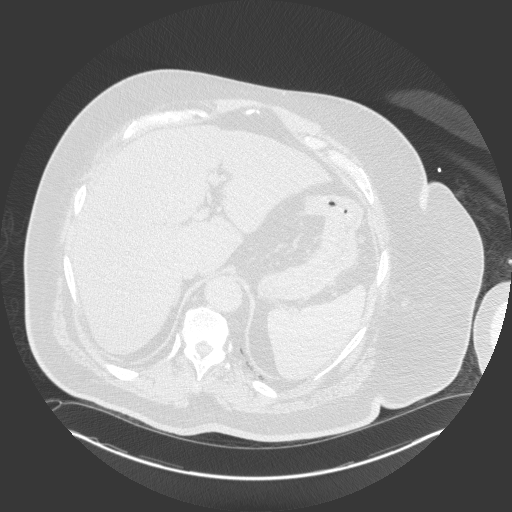

[2 of 40 positions shown; findings below may reference images not displayed]

MEDICATIONS:
NONE

ANESTHESIA/SEDATION:
General - as administered by the Anesthesia department

The patient was continuously monitored during the procedure by the
interventional radiology nurse under my direct supervision.

FLUOROSCOPY TIME:  CT

COMPLICATIONS:
NONE
Patient is brought to the CT scanner, with the correct patient
identified with the correct consent form identified. He was placed
on to the CT gantry table and anesthesia team was present for
induction of general anesthesia.

The patient was then scanned for scout CT. Ultrasound was performed
with images stored sent to PACs.

Patient was then prepped and draped in the usual sterile fashion.
Using ultrasound guidance, fiducial needles were placed into the
liver from an anterior approach targeting the lesion of the right
liver lobe adjacent to the portal vein bifurcation.

Using stepwise ultrasound and CT imaging, a 20 cm biopsy needle and
a 20 cm new wave microwave probe were positioned at the lesion.

Positioning required 2 separate contrast CT to be performed. The
first with 100 cc of contrast and dual phase imaging, and the second
with 80 cc of contrast and dual phase imaging.

Once the biopsy needle was positioned on the near site of the
lesion, multiple 18 gauge core biopsy were performed.

Final repositioning of the new wave probe was performed. The first
treatment was performed on the inferior and deep aspect of the
lesion. This was performed at 1 minute, 95 Chris Nigel and then 10 minutes
65 Chris Nigel for 11 minutes of treatment time.

Repeat CT was performed.

Needle was then repositioned slightly more superior. Once we
confirmed needle position, a second treatment was performed. This
was performed at 1 minute 95 Chris Nigel, and then 10 minutes 65 Chris Nigel for
total of 11 minutes treatment time.

All needles and treatment probes were removed.

Final CT was performed.

Patient tolerated procedure well and remained hemodynamically stable
throughout.

No complications were encountered and no significant blood loss.
FINDINGS: Images during the case demonstrate targeting of lesion at the level
of the portal vein bifurcation. Given the difficulty visualizing
this lesion, the prior MRI was used for geographic targeting, as
well as the landmarks of the portal vein for geographic targeting.
IMPRESSION: Status post CT-guided biopsy of liver lesion and ablation of the
lesion with microwave technology using 2 separate treatment times.

## 2020-06-11 IMAGING — US IR FLUORO GUIDE CV LINE*L*
1 series · 1 of 1 positions shown · non-contrast
Comparison: none

CLINICAL DATA: Metastatic adenocarcinoma of the descending colon to
the liver and need for porta cath for chemotherapy.

[Series 1: ir fluoro/shunt/fist · 1 of 1 slices shown]
[im 1/1]
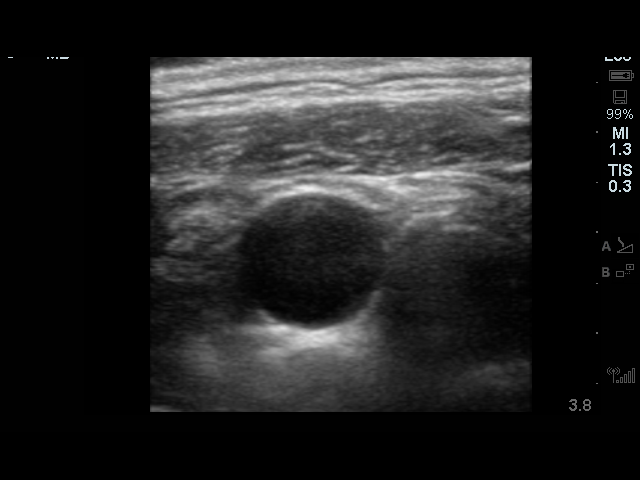

[1 of 1 positions shown; findings below may reference images not displayed]

EXAM:
IMPLANTED PORT A CATH PLACEMENT WITH ULTRASOUND AND FLUOROSCOPIC
GUIDANCE

ANESTHESIA/SEDATION:
2.0 mg IV Versed; 100 mcg IV Fentanyl

Total Moderate Sedation Time:  33 minutes

The patient's level of consciousness and physiologic status were
continuously monitored during the procedure by Radiology nursing.

Additional Medications: 2 g IV Ancef.

FLUOROSCOPY TIME:  18 seconds.  10.3 mGy.

PROCEDURE:
The procedure, risks, benefits, and alternatives were explained to
the patient. Questions regarding the procedure were encouraged and
answered. The patient understands and consents to the procedure. A
time-out was performed prior to initiating the procedure.

Ultrasound was utilized to confirm patency of the right internal
jugular vein. The right neck and chest were prepped with
chlorhexidine in a sterile fashion, and a sterile drape was applied
covering the operative field. Maximum barrier sterile technique with
sterile gowns and gloves were used for the procedure. Local
anesthesia was provided with 1% lidocaine.

After creating a small venotomy incision, a 21 gauge needle was
advanced into the right internal jugular vein under direct,
real-time ultrasound guidance. Ultrasound image documentation was
performed. After securing guidewire access, an 8 Fr dilator was
placed. A J-wire was kinked to measure appropriate catheter length.

A subcutaneous port pocket was then created along the upper chest
wall utilizing sharp and blunt dissection. Portable cautery was
utilized. The pocket was irrigated with sterile saline.

A single lumen power injectable port was chosen for placement. The 8
Fr catheter was tunneled from the port pocket site to the venotomy
incision. The port was placed in the pocket. External catheter was
trimmed to appropriate length based on guidewire measurement.

At the venotomy, an 8 Fr peel-away sheath was placed over a
guidewire. The catheter was then placed through the sheath and the
sheath removed. Final catheter positioning was confirmed and
documented with a fluoroscopic spot image. The port was accessed
with a needle and aspirated and flushed with heparinized saline. The
access needle was removed.

The venotomy and port pocket incisions were closed with subcutaneous
3-0 Monocryl and subcuticular 4-0 Vicryl. Dermabond was applied to
both incisions.

COMPLICATIONS:
COMPLICATIONS
None
FINDINGS: After catheter placement, the tip lies at the [place] junction.
The catheter aspirates normally and is ready for immediate use.
IMPRESSION: Placement of single lumen port a cath via right internal jugular
vein. The catheter tip lies at the [place] junction. A power
injectable port a cath was placed and is ready for immediate use.

## 2020-07-17 DIAGNOSIS — I10 Essential (primary) hypertension: Secondary | ICD-10-CM | POA: Diagnosis not present

## 2020-07-17 DIAGNOSIS — M1712 Unilateral primary osteoarthritis, left knee: Secondary | ICD-10-CM | POA: Diagnosis not present

## 2020-07-17 DIAGNOSIS — E7849 Other hyperlipidemia: Secondary | ICD-10-CM | POA: Diagnosis not present

## 2020-07-17 DIAGNOSIS — C189 Malignant neoplasm of colon, unspecified: Secondary | ICD-10-CM | POA: Diagnosis not present

## 2020-08-08 DIAGNOSIS — N400 Enlarged prostate without lower urinary tract symptoms: Secondary | ICD-10-CM | POA: Diagnosis not present

## 2020-08-08 DIAGNOSIS — Z299 Encounter for prophylactic measures, unspecified: Secondary | ICD-10-CM | POA: Diagnosis not present

## 2020-08-08 DIAGNOSIS — I10 Essential (primary) hypertension: Secondary | ICD-10-CM | POA: Diagnosis not present

## 2020-08-08 DIAGNOSIS — Z6836 Body mass index (BMI) 36.0-36.9, adult: Secondary | ICD-10-CM | POA: Diagnosis not present

## 2020-08-08 DIAGNOSIS — C189 Malignant neoplasm of colon, unspecified: Secondary | ICD-10-CM | POA: Diagnosis not present

## 2020-08-30 ENCOUNTER — Encounter: Payer: Self-pay | Admitting: *Deleted

## 2020-08-30 NOTE — Progress Notes (Unsigned)
Scheduling message sent to move 10/22 labs to 10/21 prior to CT scan.

## 2020-09-06 ENCOUNTER — Other Ambulatory Visit: Payer: Self-pay

## 2020-09-06 ENCOUNTER — Inpatient Hospital Stay: Payer: Medicare Other | Attending: Oncology

## 2020-09-06 ENCOUNTER — Encounter (HOSPITAL_COMMUNITY): Payer: Self-pay

## 2020-09-06 ENCOUNTER — Ambulatory Visit (HOSPITAL_COMMUNITY)
Admission: RE | Admit: 2020-09-06 | Discharge: 2020-09-06 | Disposition: A | Discharge status: Home / Self Care | Payer: Medicare Other | Source: Ambulatory Visit | Attending: Oncology | Admitting: Oncology

## 2020-09-06 DIAGNOSIS — Z85038 Personal history of other malignant neoplasm of large intestine: Secondary | ICD-10-CM | POA: Diagnosis not present

## 2020-09-06 DIAGNOSIS — Z23 Encounter for immunization: Secondary | ICD-10-CM | POA: Diagnosis not present

## 2020-09-06 DIAGNOSIS — I700 Atherosclerosis of aorta: Secondary | ICD-10-CM | POA: Diagnosis not present

## 2020-09-06 DIAGNOSIS — K760 Fatty (change of) liver, not elsewhere classified: Secondary | ICD-10-CM | POA: Diagnosis not present

## 2020-09-06 DIAGNOSIS — K4020 Bilateral inguinal hernia, without obstruction or gangrene, not specified as recurrent: Secondary | ICD-10-CM | POA: Diagnosis not present

## 2020-09-06 DIAGNOSIS — C186 Malignant neoplasm of descending colon: Secondary | ICD-10-CM | POA: Insufficient documentation

## 2020-09-06 DIAGNOSIS — K449 Diaphragmatic hernia without obstruction or gangrene: Secondary | ICD-10-CM | POA: Diagnosis not present

## 2020-09-06 DIAGNOSIS — D171 Benign lipomatous neoplasm of skin and subcutaneous tissue of trunk: Secondary | ICD-10-CM | POA: Diagnosis not present

## 2020-09-06 DIAGNOSIS — N200 Calculus of kidney: Secondary | ICD-10-CM | POA: Diagnosis not present

## 2020-09-06 LAB — BASIC METABOLIC PANEL - CANCER CENTER ONLY
Anion gap: 13 (ref 5–15)
BUN: 16 mg/dL (ref 8–23)
CO2: 31 mmol/L (ref 22–32)
Calcium: 10.4 mg/dL — ABNORMAL HIGH (ref 8.9–10.3)
Chloride: 94 mmol/L — ABNORMAL LOW (ref 98–111)
Creatinine: 1.22 mg/dL (ref 0.61–1.24)
GFR, Estimated: >60 mL/min (ref >60)
Glucose, Bld: 211 mg/dL — ABNORMAL HIGH (ref 70–99)
Potassium: 3.6 mmol/L (ref 3.5–5.1)
Sodium: 138 mmol/L (ref 135–145)

## 2020-09-06 LAB — CEA (IN HOUSE-CHCC): CEA (CHCC-In House): 10.41 ng/mL — ABNORMAL HIGH (ref 0.00–5.00)

## 2020-09-06 MED ORDER — IOHEXOL 300 MG/ML  SOLN
100.0000 mL | Freq: Once | INTRAMUSCULAR | Status: AC | PRN
  Administered 2020-09-06: 100 mL via INTRAVENOUS

## 2020-09-07 ENCOUNTER — Inpatient Hospital Stay: Payer: Medicare Other

## 2020-09-10 ENCOUNTER — Other Ambulatory Visit: Payer: Self-pay

## 2020-09-10 ENCOUNTER — Inpatient Hospital Stay (HOSPITAL_BASED_OUTPATIENT_CLINIC_OR_DEPARTMENT_OTHER): Payer: Medicare Other | Admitting: Oncology

## 2020-09-10 VITALS — BP 152/89 | HR 66 | Temp 98.5°F | Resp 17 | Ht 74.0 in | Wt 283.1 lb

## 2020-09-10 DIAGNOSIS — C186 Malignant neoplasm of descending colon: Secondary | ICD-10-CM

## 2020-09-10 DIAGNOSIS — Z23 Encounter for immunization: Secondary | ICD-10-CM

## 2020-09-10 MED ORDER — INFLUENZA VAC A&B SA ADJ QUAD 0.5 ML IM PRSY
0.5000 mL | PREFILLED_SYRINGE | Freq: Once | INTRAMUSCULAR | Status: AC
  Administered 2020-09-10: 0.5 mL via INTRAMUSCULAR

## 2020-09-10 MED ORDER — INFLUENZA VAC A&B SA ADJ QUAD 0.5 ML IM PRSY
PREFILLED_SYRINGE | INTRAMUSCULAR | Status: AC
  Filled 2020-09-10: qty 0.5

## 2020-09-10 NOTE — Progress Notes (Signed)
[facility] OFFICE PROGRESS NOTE   Diagnosis: Colon cancer  INTERVAL HISTORY:   Jeremy Stevenson returns as scheduled.  He generally feels well.  He continues to have neuropathy symptoms in the hands and feet.  Gabapentin helps at night.  A colonoscopy 05/03/2020 found a polyp in the cecum.  The polyp was removed and the pathology revealed a hyperplastic polyp.   Objective:  Vital signs in last 24 hours:  Blood pressure (!) 152/89, pulse 66, temperature 98.5 F (36.9 C), temperature source Tympanic, resp. rate 17, height _0  (1.88 m), weight 283 lb 1.6 oz (128.4 kg), SpO2 98 %.    Lymphatics: No cervical, supraclavicular, axillary, or inguinal nodes Resp: Lungs clear bilaterally Cardio: Regular rate and rhythm GI: No hepatosplenomegaly, no mass, nontender Vascular: No leg edema     Lab Results:  Lab Results  Component Value Date   WBC 11.7 (H) 05/26/2019   HGB 12.8 (L) 05/26/2019   HCT 39.2 05/26/2019   MCV 96.8 05/26/2019   PLT 145 (L) 05/26/2019   NEUTROABS 8.4 (H) 05/26/2019    CMP  Lab Results  Component Value Date   NA 138 09/06/2020   K 3.6 09/06/2020   CL 94 (L) 09/06/2020   CO2 31 09/06/2020   GLUCOSE 211 (H) 09/06/2020   BUN 16 09/06/2020   CREATININE 1.22 09/06/2020   CALCIUM 10.4 (H) 09/06/2020   PROT 7.4 03/06/2020   ALBUMIN 3.6 03/06/2020   AST 36 03/06/2020   ALT 29 03/06/2020   ALKPHOS 108 03/06/2020   BILITOT 0.8 03/06/2020   GFRNONAA >60 09/06/2020   GFRAA >60 03/07/2020    Lab Results  Component Value Date   CEA1 10.41 (H) 09/06/2020    Medications: I have reviewed the patient's current medications.   Assessment/Plan:  1. Adenocarcinoma of the descending colon, stage II (T3 N0), status post a left colectomy 01/20/2017 ? MSI-stable, no loss of mismatch repair protein expression ? Elevated preoperative CEA ? Incomplete preoperative colonoscopy ? Colonoscopy 08/07/2017-multiple polyps removed including tubular  adenomas ? CT abdomen/pelvis 02/05/2018-no evidence of metastatic disease. ? Elevated CEA June 2019 ? CTs 06/29/2018-no evidence of metastatic disease, stable mild bilateral iliac adenopathy ? PET scan 1/50/5697-[id number] hypermetabolic central right liver metastasis;asymmetric hypermetabolism right palatine tonsil with associated mild asymmetric soft tissue fullness on CT images ? MRI liver 08/25/2018-2.6 cm mass in the central right liver consistent with metastasis, no other evidence of metastatic disease ? Ablation and biopsy of right liver lesion 09/29/2018-pathology revealed adenocarcinoma; foundation 1-KRAS G12V, NRAS wildtype, microsatellite stable, tumor mutational burden 1 ? Cycle 1 FOLFOX 10/25/2018 ? Cycle 2 FOLFOX 11/09/2018 ? Cycle 3 FOLFOX 11/23/2018 ? Cycle 4 FOLFOX 12/07/2018 ? Cycle 5 FOLFOX 12/21/2018 (oxaliplatin dose reduced secondary to thrombocytopenia) ? Cycle 6 FOLFOX 01/04/2019 ? Cycle 7 FOLFOX 01/18/2019 ? Cycle 8 FOLFOX 02/01/2019 ? Cycle 9 FOLFOX 02/15/2019 ? MRI abdomen 02/24/2019- ablation site in the liver without residual tumor identified. Mild intrahepatic biliary dilatation distal to the ablation site. Diffuse hepatic steatosis. No new liver lesions identified. ? Cycle 10 FOLFOX 02/28/2019 (oxaliplatin held due to neuropathy and thrombocytopenia) ? Cycle 11 FOLFOX 03/14/2019 (oxaliplatin eliminated from the regimen due to neuropathy, 5-fluorouracil dose adjusted due to diarrhea and tearing) ? Cycle 12 FOLFOX 03/28/2019 (no oxaliplatin, 5-fluorouracil as per treatment 03/14/2019) ? CTs 09/08/2019-no evidence of recurrent disease, no evidence of recurrent disease at the hepatic ablation site, stable renal cysts ? CT abdomen/pelvis 03/06/2020-stable ablation site, no evidence of disease progression, stable  gastric lipoma ? Colonoscopy 05/03/2020-polyp removed from the cecum-hyperplastic polyp ? Elevated CEA 09/06/2020 ? CTs 09/06/2020-no evidence of recurrent disease, stable  ablation change in the right liver  2. Enlargement of the right testicle-hydrocele-Testicular ultrasound 02/26/2017-negative for mass, small bilateral hydroceles  3. Hypertension  4. Depression  5. Family history of breast and ovarian cancer, negative genetic testing  6.History of left leg edema, pain.  7.Pigmented lesion right lower leg-refer to dermatology 06/30/2018  8. Port-A-Cath placement 10/13/2018  9.Thrombocytopenia secondary to chemotherapy  10.Oxaliplatin neuropathy-trial of gabapentin 12/30/2019     Disposition: Jeremy Stevenson appears unchanged.  The CEA was mildly elevated last week.  The restaging CTs reveal no evidence of recurrent colon cancer and he underwent a negative colonoscopy in June.  I discussed the implication of the elevated CEA with him.  This may be a benign nonspecific finding or an early indicator of recurrent colon cancer.  He will return for an office visit and repeat CEA at a 63-monthinterval.  We will consider submitting peripheral blood for [employer] testing if the CEA is more elevated.  I will refer him for a restaging liver MRI if the CEA is again elevated. I reviewed the CT images.   Jeremy Stevenson an influenza vaccine today.  Jeremy Coder MD  09/10/2020  10:54 AM

## 2020-09-11 ENCOUNTER — Telehealth: Payer: Self-pay | Admitting: Oncology

## 2020-09-11 ENCOUNTER — Telehealth: Payer: Self-pay | Admitting: *Deleted

## 2020-09-11 NOTE — Telephone Encounter (Signed)
Scheduled appointments per 10/25 los. Spoke to patient who is aware of appointments dates and times.

## 2020-09-11 NOTE — Telephone Encounter (Signed)
Discussed office visit and plans w/wife per Dr. Gearldine Shown note. She understands and agrees.

## 2020-09-12 ENCOUNTER — Other Ambulatory Visit: Payer: Self-pay | Admitting: Interventional Radiology

## 2020-09-12 DIAGNOSIS — C186 Malignant neoplasm of descending colon: Secondary | ICD-10-CM

## 2020-09-12 DIAGNOSIS — R160 Hepatomegaly, not elsewhere classified: Secondary | ICD-10-CM

## 2020-09-14 DIAGNOSIS — I10 Essential (primary) hypertension: Secondary | ICD-10-CM | POA: Diagnosis not present

## 2020-09-14 DIAGNOSIS — M1712 Unilateral primary osteoarthritis, left knee: Secondary | ICD-10-CM | POA: Diagnosis not present

## 2020-09-14 DIAGNOSIS — C189 Malignant neoplasm of colon, unspecified: Secondary | ICD-10-CM | POA: Diagnosis not present

## 2020-09-14 DIAGNOSIS — E7849 Other hyperlipidemia: Secondary | ICD-10-CM | POA: Diagnosis not present

## 2020-10-16 DIAGNOSIS — E7849 Other hyperlipidemia: Secondary | ICD-10-CM | POA: Diagnosis not present

## 2020-10-16 DIAGNOSIS — C189 Malignant neoplasm of colon, unspecified: Secondary | ICD-10-CM | POA: Diagnosis not present

## 2020-10-16 DIAGNOSIS — I10 Essential (primary) hypertension: Secondary | ICD-10-CM | POA: Diagnosis not present

## 2020-10-16 DIAGNOSIS — M1712 Unilateral primary osteoarthritis, left knee: Secondary | ICD-10-CM | POA: Diagnosis not present

## 2020-11-08 DIAGNOSIS — Z79899 Other long term (current) drug therapy: Secondary | ICD-10-CM | POA: Diagnosis not present

## 2020-11-08 DIAGNOSIS — R5383 Other fatigue: Secondary | ICD-10-CM | POA: Diagnosis not present

## 2020-11-08 DIAGNOSIS — Z7189 Other specified counseling: Secondary | ICD-10-CM | POA: Diagnosis not present

## 2020-11-08 DIAGNOSIS — Z299 Encounter for prophylactic measures, unspecified: Secondary | ICD-10-CM | POA: Diagnosis not present

## 2020-11-08 DIAGNOSIS — I10 Essential (primary) hypertension: Secondary | ICD-10-CM | POA: Diagnosis not present

## 2020-11-08 DIAGNOSIS — Z0000 Encounter for general adult medical examination without abnormal findings: Secondary | ICD-10-CM | POA: Diagnosis not present

## 2020-11-08 DIAGNOSIS — E7800 Pure hypercholesterolemia, unspecified: Secondary | ICD-10-CM | POA: Diagnosis not present

## 2020-11-08 DIAGNOSIS — Z1331 Encounter for screening for depression: Secondary | ICD-10-CM | POA: Diagnosis not present

## 2020-11-08 DIAGNOSIS — Z6835 Body mass index (BMI) 35.0-35.9, adult: Secondary | ICD-10-CM | POA: Diagnosis not present

## 2020-11-08 DIAGNOSIS — Z23 Encounter for immunization: Secondary | ICD-10-CM | POA: Diagnosis not present

## 2020-11-08 DIAGNOSIS — Z1339 Encounter for screening examination for other mental health and behavioral disorders: Secondary | ICD-10-CM | POA: Diagnosis not present

## 2020-11-08 DIAGNOSIS — Z125 Encounter for screening for malignant neoplasm of prostate: Secondary | ICD-10-CM | POA: Diagnosis not present

## 2020-11-12 ENCOUNTER — Other Ambulatory Visit: Payer: Self-pay

## 2020-11-12 ENCOUNTER — Inpatient Hospital Stay: Payer: Medicare Other | Attending: Oncology

## 2020-11-12 DIAGNOSIS — C787 Secondary malignant neoplasm of liver and intrahepatic bile duct: Secondary | ICD-10-CM | POA: Insufficient documentation

## 2020-11-12 DIAGNOSIS — D6959 Other secondary thrombocytopenia: Secondary | ICD-10-CM | POA: Insufficient documentation

## 2020-11-12 DIAGNOSIS — C186 Malignant neoplasm of descending colon: Secondary | ICD-10-CM | POA: Insufficient documentation

## 2020-11-12 DIAGNOSIS — I10 Essential (primary) hypertension: Secondary | ICD-10-CM | POA: Diagnosis not present

## 2020-11-12 LAB — BASIC METABOLIC PANEL - CANCER CENTER ONLY
Anion gap: 9 (ref 5–15)
BUN: 18 mg/dL (ref 8–23)
CO2: 33 mmol/L — ABNORMAL HIGH (ref 22–32)
Calcium: 10.4 mg/dL — ABNORMAL HIGH (ref 8.9–10.3)
Chloride: 96 mmol/L — ABNORMAL LOW (ref 98–111)
Creatinine: 1.34 mg/dL — ABNORMAL HIGH (ref 0.61–1.24)
GFR, Estimated: 58 mL/min — ABNORMAL LOW (ref >60)
Glucose, Bld: 255 mg/dL — ABNORMAL HIGH (ref 70–99)
Potassium: 4.4 mmol/L (ref 3.5–5.1)
Sodium: 138 mmol/L (ref 135–145)

## 2020-11-12 LAB — CEA (IN HOUSE-CHCC): CEA (CHCC-In House): 19.46 ng/mL — ABNORMAL HIGH (ref 0.00–5.00)

## 2020-11-13 ENCOUNTER — Encounter: Payer: Self-pay | Admitting: Nurse Practitioner

## 2020-11-13 ENCOUNTER — Inpatient Hospital Stay (HOSPITAL_BASED_OUTPATIENT_CLINIC_OR_DEPARTMENT_OTHER): Payer: Medicare Other | Admitting: Nurse Practitioner

## 2020-11-13 ENCOUNTER — Other Ambulatory Visit: Payer: Self-pay

## 2020-11-13 VITALS — BP 179/97 | HR 65 | Temp 99.2°F | Resp 16 | Ht 74.0 in | Wt 281.4 lb

## 2020-11-13 DIAGNOSIS — I10 Essential (primary) hypertension: Secondary | ICD-10-CM | POA: Diagnosis not present

## 2020-11-13 DIAGNOSIS — C186 Malignant neoplasm of descending colon: Secondary | ICD-10-CM | POA: Diagnosis not present

## 2020-11-13 DIAGNOSIS — D6959 Other secondary thrombocytopenia: Secondary | ICD-10-CM | POA: Diagnosis not present

## 2020-11-13 DIAGNOSIS — C787 Secondary malignant neoplasm of liver and intrahepatic bile duct: Secondary | ICD-10-CM | POA: Diagnosis not present

## 2020-11-13 NOTE — Progress Notes (Addendum)
[facility] OFFICE PROGRESS NOTE   Diagnosis: Colon cancer  INTERVAL HISTORY:   Jeremy Stevenson returns as scheduled.  He overall feels well.  He has a good appetite.  He denies abdominal pain.  No nausea or vomiting.  No constipation or diarrhea.  He reports continued numbness in the hands and feet.  Objective:  Vital signs in last 24 hours:  Blood pressure (!) 179/97, pulse 65, temperature 99.2 F (37.3 C), resp. rate 16, height _0  (1.88 m), weight 281 lb 6.4 oz (127.6 kg), SpO2 97 %.    HEENT: Neck without mass. Lymphatics: No palpable cervical, supraclavicular, axillary or inguinal lymph nodes. Resp: Lungs clear bilaterally. Cardio: Regular rate and rhythm. GI: Abdomen soft and nontender.  No hepatomegaly. Vascular: No leg edema.  Lab Results:  Lab Results  Component Value Date   WBC 11.7 (H) 05/26/2019   HGB 12.8 (L) 05/26/2019   HCT 39.2 05/26/2019   MCV 96.8 05/26/2019   PLT 145 (L) 05/26/2019   NEUTROABS 8.4 (H) 05/26/2019    Imaging:  No results found.  Medications: I have reviewed the patient's current medications.  Assessment/Plan: 1. Adenocarcinoma of the descending colon, stage II (T3 N0), status post a left colectomy 01/20/2017 ? MSI-stable, no loss of mismatch repair protein expression ? Elevated preoperative CEA ? Incomplete preoperative colonoscopy ? Colonoscopy 08/07/2017-multiple polyps removed including tubular adenomas ? CT abdomen/pelvis 02/05/2018-no evidence of metastatic disease. ? Elevated CEA June 2019 ? CTs 06/29/2018-no evidence of metastatic disease, stable mild bilateral iliac adenopathy ? PET scan 8/41/6606-[id number] hypermetabolic central right liver metastasis;asymmetric hypermetabolism right palatine tonsil with associated mild asymmetric soft tissue fullness on CT images ? MRI liver 08/25/2018-2.6 cm mass in the central right liver consistent with metastasis, no other evidence of metastatic disease ? Ablation and  biopsy of right liver lesion 09/29/2018-pathology revealed adenocarcinoma; foundation 1-KRAS G12V, NRAS wildtype, microsatellite stable, tumor mutational burden 1 ? Cycle 1 FOLFOX 10/25/2018 ? Cycle 2 FOLFOX 11/09/2018 ? Cycle 3 FOLFOX 11/23/2018 ? Cycle 4 FOLFOX 12/07/2018 ? Cycle 5 FOLFOX 12/21/2018 (oxaliplatin dose reduced secondary to thrombocytopenia) ? Cycle 6 FOLFOX 01/04/2019 ? Cycle 7 FOLFOX 01/18/2019 ? Cycle 8 FOLFOX 02/01/2019 ? Cycle 9 FOLFOX 02/15/2019 ? MRI abdomen 02/24/2019-ablation site in the liver without residual tumor identified. Mild intrahepatic biliary dilatation distal to the ablation site. Diffuse hepatic steatosis. No new liver lesions identified. ? Cycle 10 FOLFOX 02/28/2019 (oxaliplatin held due to neuropathy and thrombocytopenia) ? Cycle 11 FOLFOX 03/14/2019 (oxaliplatin eliminated from the regimen due to neuropathy, 5-fluorouracil dose adjusted due to diarrhea and tearing) ? Cycle 12 FOLFOX 03/28/2019 (no oxaliplatin, 5-fluorouracil as per treatment 03/14/2019) ? CTs 09/08/2019-no evidence of recurrent disease, no evidence of recurrent disease at the hepatic ablation site, stable renal cysts ? CT abdomen/pelvis 03/06/2020-stable ablation site, no evidence of disease progression, stable gastric lipoma ? Colonoscopy 05/03/2020-polyp removed from the cecum-hyperplastic polyp ? Elevated CEA 09/06/2020 ? CTs 09/06/2020-no evidence of recurrent disease, stable ablation change in the right liver ? Further elevation of CEA 11/12/2020  2. Enlargement of the right testicle-hydrocele-Testicular ultrasound 02/26/2017-negative for mass, small bilateral hydroceles  3. Hypertension  4. Depression  5. Family history of breast and ovarian cancer, negative genetic testing  6.History of left leg edema, pain.  7.Pigmented lesion right lower leg-refer to dermatology 06/30/2018  8. Port-A-Cath placement 10/13/2018  9.Thrombocytopenia secondary to  chemotherapy  10.Oxaliplatin neuropathy-trial of gabapentin 12/30/2019   Disposition: Jeremy Stevenson appears unchanged.  The CEA was further elevated yesterday.  We are referring him for a restaging PET scan.  He will return for follow-up in 2 weeks.  Patient seen with Dr. Benay Spice.    [name] ANP/GNP-BC   11/13/2020  10:47 AM This was a shared visit with [name].  We discussed implications and evaluation of the elevated CEA with Jeremy Stevenson and his wife.  The plan is to proceed with a staging PET scan.  Julieanne Manson, MD

## 2020-11-15 ENCOUNTER — Telehealth: Payer: Self-pay | Admitting: Nurse Practitioner

## 2020-11-15 NOTE — Telephone Encounter (Signed)
Scheduled appointment per 12/28 los. Spoke to patient's wife who is aware of appointment date and time.

## 2020-11-16 DIAGNOSIS — E7849 Other hyperlipidemia: Secondary | ICD-10-CM | POA: Diagnosis not present

## 2020-11-16 DIAGNOSIS — M1712 Unilateral primary osteoarthritis, left knee: Secondary | ICD-10-CM | POA: Diagnosis not present

## 2020-11-16 DIAGNOSIS — I10 Essential (primary) hypertension: Secondary | ICD-10-CM | POA: Diagnosis not present

## 2020-11-16 DIAGNOSIS — C189 Malignant neoplasm of colon, unspecified: Secondary | ICD-10-CM | POA: Diagnosis not present

## 2020-11-22 ENCOUNTER — Encounter (HOSPITAL_COMMUNITY)
Admission: RE | Admit: 2020-11-22 | Discharge: 2020-11-22 | Disposition: A | Discharge status: Home / Self Care | Payer: Medicare Other | Source: Ambulatory Visit | Attending: Nurse Practitioner | Admitting: Nurse Practitioner

## 2020-11-22 ENCOUNTER — Other Ambulatory Visit: Payer: Self-pay

## 2020-11-22 DIAGNOSIS — C186 Malignant neoplasm of descending colon: Secondary | ICD-10-CM | POA: Diagnosis not present

## 2020-11-22 DIAGNOSIS — I2510 Atherosclerotic heart disease of native coronary artery without angina pectoris: Secondary | ICD-10-CM | POA: Diagnosis not present

## 2020-11-22 LAB — GLUCOSE, CAPILLARY: Glucose-Capillary: 202 mg/dL — ABNORMAL HIGH (ref 70–99)

## 2020-11-22 MED ORDER — FLUDEOXYGLUCOSE F - 18 (FDG) INJECTION
13.9900 | Freq: Once | INTRAVENOUS | Status: AC | PRN
  Administered 2020-11-22: 13.99 via INTRAVENOUS

## 2020-11-23 ENCOUNTER — Telehealth: Payer: Self-pay | Admitting: *Deleted

## 2020-11-23 NOTE — Telephone Encounter (Signed)
Called wife to offer appointment today at 12:00 to go over PET scan. She reports they are not able to come tomorrow. Asking if there is a concern from PET that he needs to come today? Per Dr. Benay Spice, there is a small area at previous treatment site that radiologist suggests doing MRI to determent if it is a local recurrence or not. No urgency per Dr. Benay Spice. They decided to wait and discuss with MD on 1/14 appointment.

## 2020-11-30 ENCOUNTER — Other Ambulatory Visit: Payer: Self-pay

## 2020-11-30 ENCOUNTER — Inpatient Hospital Stay: Payer: Medicare Other | Attending: Oncology | Admitting: Oncology

## 2020-11-30 VITALS — BP 152/81 | HR 63 | Temp 97.3°F | Resp 16 | Ht 74.0 in | Wt 279.7 lb

## 2020-11-30 DIAGNOSIS — F32A Depression, unspecified: Secondary | ICD-10-CM | POA: Insufficient documentation

## 2020-11-30 DIAGNOSIS — G620 Drug-induced polyneuropathy: Secondary | ICD-10-CM | POA: Diagnosis not present

## 2020-11-30 DIAGNOSIS — C787 Secondary malignant neoplasm of liver and intrahepatic bile duct: Secondary | ICD-10-CM | POA: Insufficient documentation

## 2020-11-30 DIAGNOSIS — C186 Malignant neoplasm of descending colon: Secondary | ICD-10-CM | POA: Insufficient documentation

## 2020-11-30 DIAGNOSIS — I10 Essential (primary) hypertension: Secondary | ICD-10-CM | POA: Insufficient documentation

## 2020-11-30 DIAGNOSIS — D6959 Other secondary thrombocytopenia: Secondary | ICD-10-CM | POA: Insufficient documentation

## 2020-11-30 NOTE — Progress Notes (Signed)
[place] OFFICE PROGRESS NOTE   Diagnosis: Colon cancer  INTERVAL HISTORY:   Jeremy Stevenson returns for scheduled visit.  He feels well.  No new complaint.  He is here today with his wife.  Objective:  Vital signs in last 24 hours:  Blood pressure (!) 152/81, pulse 63, temperature (!) 97.3 F (36.3 C), temperature source Tympanic, resp. rate 16, height '6\' 2"'  (1.88 m), weight 279 lb 11.2 oz (126.9 kg), SpO2 98 %.    Lymphatics: No cervical, supraclavicular, axillary, or inguinal nodes Resp: Lungs clear bilaterally Cardio: Regular rate and rhythm GI: No hepatosplenomegaly, no mass, nontender Vascular: The left lower leg is larger than the right side with varices   Lab Results:  Lab Results  Component Value Date   WBC 11.7 (H) 05/26/2019   HGB 12.8 (L) 05/26/2019   HCT 39.2 05/26/2019   MCV 96.8 05/26/2019   PLT 145 (L) 05/26/2019   NEUTROABS 8.4 (H) 05/26/2019    CMP  Lab Results  Component Value Date   NA 138 11/12/2020   K 4.4 11/12/2020   CL 96 (L) 11/12/2020   CO2 33 (H) 11/12/2020   GLUCOSE 255 (H) 11/12/2020   BUN 18 11/12/2020   CREATININE 1.34 (H) 11/12/2020   CALCIUM 10.4 (H) 11/12/2020   PROT 7.4 03/06/2020   ALBUMIN 3.6 03/06/2020   AST 36 03/06/2020   ALT 29 03/06/2020   ALKPHOS 108 03/06/2020   BILITOT 0.8 03/06/2020   GFRNONAA 58 (L) 11/12/2020   GFRAA >60 03/07/2020    Lab Results  Component Value Date   CEA1 19.46 (H) 11/12/2020     Medications: I have reviewed the patient's current medications.   Assessment/Plan: 1. Adenocarcinoma of the descending colon, stage II (T3 N0), status post a left colectomy 01/20/2017 ? MSI-stable, no loss of mismatch repair protein expression ? Elevated preoperative CEA ? Incomplete preoperative colonoscopy ? Colonoscopy 08/07/2017-multiple polyps removed including tubular adenomas ? CT abdomen/pelvis 02/05/2018-no evidence of metastatic disease. ? Elevated CEA June 2019 ? CTs  06/29/2018-no evidence of metastatic disease, stable mild bilateral iliac adenopathy ? PET scan 2/48/2500-[id number] hypermetabolic central right liver metastasis;asymmetric hypermetabolism right palatine tonsil with associated mild asymmetric soft tissue fullness on CT images ? MRI liver 08/25/2018-2.6 cm mass in the central right liver consistent with metastasis, no other evidence of metastatic disease ? Ablation and biopsy of right liver lesion 09/29/2018-pathology revealed adenocarcinoma; foundation 1-KRAS G12V, NRAS wildtype, microsatellite stable, tumor mutational burden 1 ? Cycle 1 FOLFOX 10/25/2018 ? Cycle 2 FOLFOX 11/09/2018 ? Cycle 3 FOLFOX 11/23/2018 ? Cycle 4 FOLFOX 12/07/2018 ? Cycle 5 FOLFOX 12/21/2018 (oxaliplatin dose reduced secondary to thrombocytopenia) ? Cycle 6 FOLFOX 01/04/2019 ? Cycle 7 FOLFOX 01/18/2019 ? Cycle 8 FOLFOX 02/01/2019 ? Cycle 9 FOLFOX 02/15/2019 ? MRI abdomen 02/24/2019-ablation site in the liver without residual tumor identified. Mild intrahepatic biliary dilatation distal to the ablation site. Diffuse hepatic steatosis. No new liver lesions identified. ? Cycle 10 FOLFOX 02/28/2019 (oxaliplatin held due to neuropathy and thrombocytopenia) ? Cycle 11 FOLFOX 03/14/2019 (oxaliplatin eliminated from the regimen due to neuropathy, 5-fluorouracil dose adjusted due to diarrhea and tearing) ? Cycle 12 FOLFOX 03/28/2019 (no oxaliplatin, 5-fluorouracil as per treatment 03/14/2019) ? CTs 09/08/2019-no evidence of recurrent disease, no evidence of recurrent disease at the hepatic ablation site, stable renal cysts ? CT abdomen/pelvis 03/06/2020-stable ablation site, no evidence of disease progression, stable gastric lipoma ? Colonoscopy 05/03/2020-polyp removed from the cecum-hyperplastic polyp ? Elevated CEA 09/06/2020 ? CTs 09/06/2020-no evidence  of recurrent disease, stable ablation change in the right liver ? Further elevation of CEA 11/12/2020 ? PET 11/22/2020- small focus of  hypermetabolism in the medial right liver at the previous ablation site, no other evidence of metastatic disease  2. Enlargement of the right testicle-hydrocele-Testicular ultrasound 02/26/2017-negative for mass, small bilateral hydroceles  3. Hypertension  4. Depression  5. Family history of breast and ovarian cancer, negative genetic testing  6.History of left leg edema, pain.  7.Pigmented lesion right lower leg-refer to dermatology 06/30/2018  8. Port-A-Cath placement 10/13/2018  9.Thrombocytopenia secondary to chemotherapy  10.Oxaliplatin neuropathy-trial of gabapentin 12/30/2019    Disposition: Jeremy Stevenson appears stable.  The CEA is elevated.  I reviewed the PET findings and images with Jeremy Stevenson and his wife.  There is hypermetabolism adjacent to the right liver ablation site.  He may have local disease recurrence.  He agrees to a staging MRI of the liver.  This will be ordered for within the next 2 weeks.  He will return for an office visit on 12/19/2020.  Betsy Coder, MD  11/30/2020  2:02 PM

## 2020-12-03 ENCOUNTER — Telehealth: Payer: Self-pay | Admitting: Oncology

## 2020-12-03 NOTE — Telephone Encounter (Signed)
Scheduled appointment per 1/14 los. Spoke to patient's wife who is aware of appointment date and time.

## 2020-12-04 ENCOUNTER — Telehealth: Payer: Self-pay

## 2020-12-04 ENCOUNTER — Telehealth: Payer: Self-pay | Admitting: Nurse Practitioner

## 2020-12-04 NOTE — Telephone Encounter (Signed)
Per sch msg, Jeremy Stevenson appt has been changed from 11am to 1045am on 2/2 w/lLisa

## 2020-12-04 NOTE — Telephone Encounter (Signed)
S/W Geraldo Pitter (wife) stated they would like to hold off on scheduling f/u until recommendation from Dr. Benay Spice on what to do after MRI

## 2020-12-11 ENCOUNTER — Ambulatory Visit (HOSPITAL_COMMUNITY): Admission: RE | Admit: 2020-12-11 | Payer: Medicare Other | Source: Ambulatory Visit

## 2020-12-12 ENCOUNTER — Telehealth: Payer: Self-pay

## 2020-12-12 NOTE — Telephone Encounter (Signed)
Returned call to wife in reference to MRI appointment. Wife stated MRI was changed to 1/312/22. Verified next office appointment .(12/19/20)

## 2020-12-17 ENCOUNTER — Other Ambulatory Visit: Payer: Self-pay

## 2020-12-17 ENCOUNTER — Ambulatory Visit (HOSPITAL_COMMUNITY)
Admission: RE | Admit: 2020-12-17 | Discharge: 2020-12-17 | Disposition: A | Discharge status: Home / Self Care | Payer: Medicare Other | Source: Ambulatory Visit | Attending: Oncology | Admitting: Oncology

## 2020-12-17 DIAGNOSIS — K7689 Other specified diseases of liver: Secondary | ICD-10-CM | POA: Diagnosis not present

## 2020-12-17 DIAGNOSIS — C186 Malignant neoplasm of descending colon: Secondary | ICD-10-CM | POA: Diagnosis not present

## 2020-12-17 DIAGNOSIS — K838 Other specified diseases of biliary tract: Secondary | ICD-10-CM | POA: Diagnosis not present

## 2020-12-17 DIAGNOSIS — N281 Cyst of kidney, acquired: Secondary | ICD-10-CM | POA: Diagnosis not present

## 2020-12-17 DIAGNOSIS — C189 Malignant neoplasm of colon, unspecified: Secondary | ICD-10-CM | POA: Diagnosis not present

## 2020-12-17 MED ORDER — GADOBUTROL 1 MMOL/ML IV SOLN
10.0000 mL | Freq: Once | INTRAVENOUS | Status: AC | PRN
  Administered 2020-12-17: 10 mL via INTRAVENOUS

## 2020-12-19 ENCOUNTER — Other Ambulatory Visit: Payer: Self-pay

## 2020-12-19 ENCOUNTER — Encounter: Payer: Self-pay | Admitting: Nurse Practitioner

## 2020-12-19 ENCOUNTER — Inpatient Hospital Stay: Payer: Medicare Other | Attending: Oncology | Admitting: Nurse Practitioner

## 2020-12-19 VITALS — BP 143/80 | HR 60 | Temp 98.7°F | Resp 19 | Ht 74.0 in | Wt 278.0 lb

## 2020-12-19 DIAGNOSIS — D6959 Other secondary thrombocytopenia: Secondary | ICD-10-CM | POA: Diagnosis not present

## 2020-12-19 DIAGNOSIS — C787 Secondary malignant neoplasm of liver and intrahepatic bile duct: Secondary | ICD-10-CM | POA: Diagnosis not present

## 2020-12-19 DIAGNOSIS — I10 Essential (primary) hypertension: Secondary | ICD-10-CM | POA: Diagnosis not present

## 2020-12-19 DIAGNOSIS — C186 Malignant neoplasm of descending colon: Secondary | ICD-10-CM | POA: Diagnosis not present

## 2020-12-19 NOTE — Progress Notes (Signed)
The proposed treatment discussed in conference is for discussion purposes only and is not a binding recommendation.  The patients have not been physically examined, or presented with their treatment options.  Therefore, final treatment plans cannot be decided.   

## 2020-12-19 NOTE — Progress Notes (Addendum)
[facility] OFFICE PROGRESS NOTE   Diagnosis: Colon cancer  INTERVAL HISTORY:   Jeremy Stevenson returns as scheduled.  He feels well.  He has a good appetite and overall good energy level.  He denies pain.  Bowels moving regularly.  Objective:  Vital signs in last 24 hours:  Blood pressure (!) 143/80, pulse 60, temperature 98.7 F (37.1 C), temperature source Tympanic, resp. rate 19, height $RemoveBe'6\' 2"'yunpkhJPF$  (1.88 m), weight 278 lb (126.1 kg), SpO2 98 %.   Resp: Lungs clear bilaterally. Cardio: Regular rate and rhythm. GI: No hepatomegaly. Vascular: Left lower leg is slightly larger than the right lower leg.   Lab Results:  Lab Results  Component Value Date   WBC 11.7 (H) 05/26/2019   HGB 12.8 (L) 05/26/2019   HCT 39.2 05/26/2019   MCV 96.8 05/26/2019   PLT 145 (L) 05/26/2019   NEUTROABS 8.4 (H) 05/26/2019    Imaging:  MR LIVER W WO CONTRAST  Result Date: 12/17/2020 CLINICAL DATA:  Concern for recurrence of metastatic colorectal carcinoma at ablation site in the RIGHT hepatic lobe. EXAM: MRI ABDOMEN WITHOUT AND WITH CONTRAST TECHNIQUE: Multiplanar multisequence MR imaging of the abdomen was performed both before and after the administration of intravenous contrast. CONTRAST:  64mL GADAVIST GADOBUTROL 1 MMOL/ML IV SOLN COMPARISON:  PET-CT scan 11/22/2020, CT scan 09/06/2020, PET-CT scan 08/25/2018, PET-CT scan 08/03/2018 FINDINGS: Lower chest: Lung bases are clear. Hepatobiliary: Again demonstrated region of hypoenhancement on postcontrast T1 weighted imaging in the RIGHT hepatic lobe and a ablation site. This region measures 2.6 by 1.3 cm on image 42/20 and is contracted in size from 3.6 by 1.7 cm on comparison MRI 02/24/2019. There is no new enhancing components of this lesion. This lesion is in the near vicinity of the hypermetabolic focus on comparison FDG PET scan. Additionally there is no abnormal T2 signal or foci of restricted diffusion at this site. There is a persistent  biliary duct dilatation within the RIGHT hepatic lobe peripheral to this lesion which is stable over multiple comparison exams. There subtly more prominent duct dilatation in the a segment 7 of the RIGHT hepatic lobe (image 36/20). No additional lesions in liver. Gallbladder and common bile duct are normal. Pancreas: Pancreas is normal. No ductal dilatation. No pancreatic inflammation. Spleen: Normal. Adrenals/urinary tract: Clean there hemorrhagic and nonhemorrhagic cysts within LEFT RIGHT kidney. The cysts do not enhance on postcontrast imaging. Stomach/Bowel: Stomach, small bowel, appendix, and cecum are normal. The colon and rectosigmoid colon are normal. Vascular/Lymphatic: Abdominal aorta is normal caliber. No periportal or retroperitoneal adenopathy. No pelvic adenopathy. Other: No free fluid. Musculoskeletal: No aggressive osseous lesion. IMPRESSION: 1. Interval contraction of the ablation site within the RIGHT hepatic lobe. 2. No new enhancing lesion to localize recurrence in the RIGHT hepatic lobe 3. However, there is subtle new duct dilatation in the RIGHT hepatic lobe (segment 7). This ductal dilatation leads back to the same central region of increased metabolic activity on comparison FDG PET scan. Combination of findings are concerning for recurrence carcinoma in the RIGHT hepatic lobe. Electronically Signed   By: Suzy Bouchard M.D.   On: 12/17/2020 16:20    Medications: I have reviewed the patient's current medications.  Assessment/Plan: 1. Adenocarcinoma of the descending colon, stage II (T3 N0), status post a left colectomy 01/20/2017 ? MSI-stable, no loss of mismatch repair protein expression ? Elevated preoperative CEA ? Incomplete preoperative colonoscopy ? Colonoscopy 08/07/2017-multiple polyps removed including tubular adenomas ? CT abdomen/pelvis 02/05/2018-no evidence of  metastatic disease. ? Elevated CEA June 2019 ? CTs 06/29/2018-no evidence of metastatic disease, stable mild  bilateral iliac adenopathy ? PET scan 2/84/1324-[id number] hypermetabolic central right liver metastasis;asymmetric hypermetabolism right palatine tonsil with associated mild asymmetric soft tissue fullness on CT images ? MRI liver 08/25/2018-2.6 cm mass in the central right liver consistent with metastasis, no other evidence of metastatic disease ? Ablation and biopsy of right liver lesion 09/29/2018-pathology revealed adenocarcinoma; foundation 1-KRAS G12V, NRAS wildtype, microsatellite stable, tumor mutational burden 1 ? Cycle 1 FOLFOX 10/25/2018 ? Cycle 2 FOLFOX 11/09/2018 ? Cycle 3 FOLFOX 11/23/2018 ? Cycle 4 FOLFOX 12/07/2018 ? Cycle 5 FOLFOX 12/21/2018 (oxaliplatin dose reduced secondary to thrombocytopenia) ? Cycle 6 FOLFOX 01/04/2019 ? Cycle 7 FOLFOX 01/18/2019 ? Cycle 8 FOLFOX 02/01/2019 ? Cycle 9 FOLFOX 02/15/2019 ? MRI abdomen 02/24/2019-ablation site in the liver without residual tumor identified. Mild intrahepatic biliary dilatation distal to the ablation site. Diffuse hepatic steatosis. No new liver lesions identified. ? Cycle 10 FOLFOX 02/28/2019 (oxaliplatin held due to neuropathy and thrombocytopenia) ? Cycle 11 FOLFOX 03/14/2019 (oxaliplatin eliminated from the regimen due to neuropathy, 5-fluorouracil dose adjusted due to diarrhea and tearing) ? Cycle 12 FOLFOX 03/28/2019 (no oxaliplatin, 5-fluorouracil as per treatment 03/14/2019) ? CTs 09/08/2019-no evidence of recurrent disease, no evidence of recurrent disease at the hepatic ablation site, stable renal cysts ? CT abdomen/pelvis 03/06/2020-stable ablation site, no evidence of disease progression, stable gastric lipoma ? Colonoscopy 05/03/2020-polyp removed from the cecum-hyperplastic polyp ? Elevated CEA 09/06/2020 ? CTs 09/06/2020-no evidence of recurrent disease, stable ablation change in the right liver ? Further elevation of CEA 11/12/2020 ? PET 11/22/2020- small focus of hypermetabolism in the medial right liver at the previous  ablation site, no other evidence of metastatic disease ? MRI liver 12/17/2020-interval contraction of the ablation site within the right hepatic lobe.  No new enhancing lesion to localize recurrence in the right hepatic lobe.  Subtle new duct dilatation in the right hepatic lobe which leads back to the same central region of increased metabolic activity on comparison FDG PET scan.  Combination of findings concerning for recurrent disease in the right hepatic lobe.  2. Enlargement of the right testicle-hydrocele-Testicular ultrasound 02/26/2017-negative for mass, small bilateral hydroceles  3. Hypertension  4. Depression  5. Family history of breast and ovarian cancer, negative genetic testing  6.History of left leg edema, pain.  7.Pigmented lesion right lower leg-refer to dermatology 06/30/2018  8. Port-A-Cath placement 10/13/2018  9.Thrombocytopenia secondary to chemotherapy  10.Oxaliplatin neuropathy-trial of gabapentin 12/30/2019   Disposition: Jeremy Stevenson appears stable.  His case was discussed at the tumor conference earlier today.  He appears to have recurrent disease at/near the previous ablation site.  Dr. Benay Spice reviewed this with him and his wife at today's appointment.  The recommendation is to refer back to Dr. Earleen Newport, Interventional Radiology, to consider repeat ablation.  If repeat ablation is not an option consider referring to Dr. Fayrene Helper at [facility].  Jeremy Stevenson is in agreement with this plan.  Referral made to Interventional Radiology.  He will return for follow-up here in 2 to 3 weeks, sooner if needed.  Patient seen with Dr. Benay Spice.  MRI images reviewed on the computer with Jeremy Stevenson and his wife.    Jeremy Stevenson ANP/GNP-BC   12/19/2020  11:00 AM This was a shared visit with Jeremy Stevenson.  We reviewed the PET and MRI findings with Jeremy Stevenson.  His case was presented at the GI tumor conference earlier today.  He appears  to have progressive disease  at the ablation site causing local biliary obstruction.  We reviewed the PET and MRI images with him. The recommendation from the GI conference is to consider repeat ablation.  We made a referral to Dr. Earleen Newport.  If he is not an ablation candidate we will make a surgical referral.  Jeremy Stevenson is in agreement with this plan.  I was present for greater than 50% of today's visit.  I performed medical decision making.  Jeremy Manson, MD

## 2020-12-25 ENCOUNTER — Ambulatory Visit
Admission: RE | Admit: 2020-12-25 | Discharge: 2020-12-25 | Disposition: A | Discharge status: Home / Self Care | Payer: Medicare Other | Source: Ambulatory Visit | Attending: Interventional Radiology | Admitting: Interventional Radiology

## 2020-12-25 DIAGNOSIS — R160 Hepatomegaly, not elsewhere classified: Secondary | ICD-10-CM

## 2020-12-25 DIAGNOSIS — C19 Malignant neoplasm of rectosigmoid junction: Secondary | ICD-10-CM | POA: Diagnosis not present

## 2020-12-25 DIAGNOSIS — C787 Secondary malignant neoplasm of liver and intrahepatic bile duct: Secondary | ICD-10-CM | POA: Diagnosis not present

## 2020-12-25 DIAGNOSIS — Z9889 Other specified postprocedural states: Secondary | ICD-10-CM | POA: Diagnosis not present

## 2020-12-25 DIAGNOSIS — C186 Malignant neoplasm of descending colon: Secondary | ICD-10-CM

## 2020-12-25 HISTORY — PX: IR RADIOLOGIST EVAL & MGMT: IMG5224

## 2020-12-25 NOTE — Progress Notes (Signed)
Chief Complaint: Colon Cancer   Referring Physician(s): Dr. Shellia Carwin  History of Present Illness: Jeremy Stevenson a 70 y.o.malepresenting as a scheduledfollow upto VIR today,SP treatment of single, pathologic-proven CR met to the right liver with microwave ablation.  Jeremy Stevenson OPFYTWK in the clinic with his wife.   History: He is a gentleman who was diagnosed February of 2018 with a left colon cancer. This was discovered during a work-up of lower GI bleeding. Dr. Laural Golden performed colonoscopy 01/02/2017, with mass discovered and biopsy performed. Subsequently the patient has surgery with Dr. Dalbert Batman on 01/20/2017 with left colectomy.  ________________________________ Pathology report 01/20/2017: Colon:  - Invasive adenocarcinoma, moderately differentiated spanning 6.8cm - invades pericolonic tissue - resection margins negative -heterotopic ossification -20 of 20 nodes negative for malignancy (0/20) - MSI -stable  Initial staging was stage II (T3 N0).  He underwent chemotherapy, with Cycle 12 of FOLFOX completed 03/28/2019.  His surveillance during 2021 revealed nothing concerning on CT and colonoscopy.      We last saw Jeremy Stevenson May of 2021.   Interval History:  Despite unrevealing CT and colonoscopy during 2021, however, he had increasing CEA on serial labs in October (09/06/20 = 10.41) and December (11/12/20 = 19.46).  PET was performed 11/22/2020, demonstrating only single hypermetabolic focus, which is adjacent to the prior ablation site in the liver. MRI 12/17/20 showed indirect signs of recurrence adjacent to the ablation site.    His case was reviewed last week at our multi-disciplinary conference.    He denies any new symptoms, and other than ongoing numbness in his hands/feet as well as the psychological effects of knowing he has recurrence, he feels fine.   .    Past Medical History:  Diagnosis Date  . Depression   . Genetic testing  03/16/2017   Jeremy. Stevenson underwent genetic counseling and testing for hereditary cancer syndromes on 03/03/2017. His results were negative for mutations in all 46 genes analyzed by [facility]'s Common Hereditary Cancers Panel. Genes analyzed include: APC, ATM, AXIN2, BARD1, BMPR1A, BRCA1, BRCA2, BRIP1, CDH1, CDKN2A, CHEK2, CTNNA1, DICER1, EPCAM, GREM1, HOXB13, KIT, MEN1, MLH1, MSH2, MSH3, MSH6, MUTYH, NBN, NF1, NT  . Hypertension   . Neoplasm of uncertain behavior of descending colon 01/20/2017  . Peripheral neuropathy     Past Surgical History:  Procedure Laterality Date  . BIOPSY  05/03/2020   Procedure: BIOPSY;  Surgeon: Rogene Houston, MD;  Location: [facility];  Service: Endoscopy;;  . COLONOSCOPY N/A 01/02/2017   Procedure: COLONOSCOPY;  Surgeon: Rogene Houston, MD;  Location: [facility];  Service: Endoscopy;  Laterality: N/A;  1000  . COLONOSCOPY N/A 08/07/2017   Procedure: COLONOSCOPY;  Surgeon: Rogene Houston, MD;  Location: [facility];  Service: Endoscopy;  Laterality: N/A;  1045 - per Lelon Frohlich LM of new time  . COLONOSCOPY N/A 05/03/2020   Procedure: COLONOSCOPY;  Surgeon: Rogene Houston, MD;  Location: [facility];  Service: Endoscopy;  Laterality: N/A;  0830  . HERNIA REPAIR     70 years old  . IR IMAGING GUIDED PORT INSERTION  10/13/2018  . IR RADIOLOGIST EVAL & MGMT  09/07/2018  . IR RADIOLOGIST EVAL & MGMT  11/03/2018  . IR RADIOLOGIST EVAL & MGMT  10/25/2019  . IR RADIOLOGIST EVAL & MGMT  03/29/2020  . IR RADIOLOGIST EVAL & MGMT  12/25/2020  . IR REMOVAL TUN ACCESS W/ PORT W/O FL MOD SED  05/26/2019  . LAPAROSCOPIC SIGMOID COLECTOMY  N/A 01/20/2017   Procedure: LAPAROSCOPIC LEFT COLECTOMY, TAKEDOWN SPLENIC FLEXURE, AND BIOPSY OF PERITONEAL NODULE;  Surgeon: Fanny Skates, MD;  Location: [facility];  Service: General;  Laterality: N/A;  . POLYPECTOMY  05/03/2020   Procedure: POLYPECTOMY;  Surgeon: Rogene Houston, MD;  Location: [facility];  Service: Endoscopy;;  . RADIOLOGY  WITH ANESTHESIA N/A 09/29/2018   Procedure: CT WITH ANESTHESIA/MICROWAVE THERMAL ABLATION OF LIVER, BIOPSY;  Surgeon: Corrie Mckusick, DO;  Location: [facility];  Service: Anesthesiology;  Laterality: N/A;  . TONSILLECTOMY     70 years old    Allergies: Codeine  Medications: Prior to Admission medications   Medication Sig Start Date End Date Taking? Authorizing Provider  amLODipine (NORVASC) 5 MG tablet Take 5 mg by mouth daily.  10/05/16   [provider]  chlorthalidone (HYGROTON) 25 MG tablet Take 25 mg by mouth daily.     [provider]  citalopram (CELEXA) 20 MG tablet Take 20 mg by mouth daily.  12/06/16   [provider]  gabapentin (NEURONTIN) 300 MG capsule Take 1 capsule (300 mg total) by mouth at bedtime. 12/30/19   Ladell Pier, MD  ibuprofen (ADVIL,MOTRIN) 200 MG tablet Take 200-400 mg by mouth every 8 (eight) hours as needed for moderate pain.     [provider]  LORazepam (ATIVAN) 0.5 MG tablet Take 1 tablet (0.5 mg total) by mouth 2 (two) times daily as needed for anxiety. 03/28/19   Owens Shark, NP  losartan (COZAAR) 100 MG tablet Take 100 mg by mouth daily.  11/29/16   [provider]  metoprolol tartrate (LOPRESSOR) 100 MG tablet Take 1 tablet (100 mg total) by mouth 2 (two) times daily. 04/26/19   Ladell Pier, MD  potassium chloride SA (K-DUR,KLOR-CON) 20 MEQ tablet Take 1 tablet (20 mEq total) by mouth daily. 02/15/19   Owens Shark, NP     Family History  Problem Relation Age of Onset  . Breast cancer Mother 101  . Skin cancer Mother   . Ovarian cancer Sister 47       d.50 due to metastases  . Skin cancer Maternal Uncle   . Skin cancer Cousin 53  . Skin cancer Cousin 5    Social History   Socioeconomic History  . Marital status: Married    Spouse name: Not on file  . Number of children: Not on file  . Years of education: Not on file  . Highest education level: Not on file  Occupational History  . Not on  file  Tobacco Use  . Smoking status: Never Smoker  . Smokeless tobacco: Never Used  Vaping Use  . Vaping Use: Never used  Substance and Sexual Activity  . Alcohol use: No  . Drug use: No  . Sexual activity: Yes  Other Topics Concern  . Not on file  Social History Narrative  . Not on file   Social Determinants of Health   Financial Resource Strain: Not on file  Food Insecurity: Not on file  Transportation Needs: Not on file  Physical Activity: Not on file  Stress: Not on file  Social Connections: Not on file    ECOG Status: 0 - Asymptomatic  Review of Systems: A 12 point ROS discussed and pertinent positives are indicated in the HPI above.  All other systems are negative.  Review of Systems  Vital Signs: There were no vitals taken for this visit.  Physical Exam General: 71 yo male appearing stated  age.  Well-developed, well-nourished.  No distress. HEENT: Atraumatic, normocephalic.  Conjugate gaze, extra-ocular motor intact. No scleral icterus or scleral injection. No lesions on external ears, nose, lips, or gums.  Oral mucosa moist, pink.  Neck: Symmetric with no goiter enlargement.  Chest/Lungs:  Symmetric chest with inspiration/expiration.  No labored breathing.  Clear to auscultation with no wheezes, rhonchi, or rales.  Heart:  RRR, with no third heart sounds appreciated. No JVD appreciated.  Abdomen:  Soft, NT/ND, with + bowel sounds.   Genito-urinary: Deferred Neurologic: Alert & Oriented to person, place, and time.   Normal affect and insight.  Appropriate questions.  Moving all 4 extremities with gross sensory intact.       Mallampati Score:     Imaging: Jeremy LIVER W WO CONTRAST  Result Date: 12/17/2020 CLINICAL DATA:  Concern for recurrence of metastatic colorectal carcinoma at ablation site in the RIGHT hepatic lobe. EXAM: MRI ABDOMEN WITHOUT AND WITH CONTRAST TECHNIQUE: Multiplanar multisequence Jeremy imaging of the abdomen was performed both before and after  the administration of intravenous contrast. CONTRAST:  42m GADAVIST GADOBUTROL 1 MMOL/ML IV SOLN COMPARISON:  PET-CT scan 11/22/2020, CT scan 09/06/2020, PET-CT scan 08/25/2018, PET-CT scan 08/03/2018 FINDINGS: Lower chest: Lung bases are clear. Hepatobiliary: Again demonstrated region of hypoenhancement on postcontrast T1 weighted imaging in the RIGHT hepatic lobe and a ablation site. This region measures 2.6 by 1.3 cm on image 42/20 and is contracted in size from 3.6 by 1.7 cm on comparison MRI 02/24/2019. There is no new enhancing components of this lesion. This lesion is in the near vicinity of the hypermetabolic focus on comparison FDG PET scan. Additionally there is no abnormal T2 signal or foci of restricted diffusion at this site. There is a persistent biliary duct dilatation within the RIGHT hepatic lobe peripheral to this lesion which is stable over multiple comparison exams. There subtly more prominent duct dilatation in the a segment 7 of the RIGHT hepatic lobe (image 36/20). No additional lesions in liver. Gallbladder and common bile duct are normal. Pancreas: Pancreas is normal. No ductal dilatation. No pancreatic inflammation. Spleen: Normal. Adrenals/urinary tract: Clean there hemorrhagic and nonhemorrhagic cysts within LEFT RIGHT kidney. The cysts do not enhance on postcontrast imaging. Stomach/Bowel: Stomach, small bowel, appendix, and cecum are normal. The colon and rectosigmoid colon are normal. Vascular/Lymphatic: Abdominal aorta is normal caliber. No periportal or retroperitoneal adenopathy. No pelvic adenopathy. Other: No free fluid. Musculoskeletal: No aggressive osseous lesion. IMPRESSION: 1. Interval contraction of the ablation site within the RIGHT hepatic lobe. 2. No new enhancing lesion to localize recurrence in the RIGHT hepatic lobe 3. However, there is subtle new duct dilatation in the RIGHT hepatic lobe (segment 7). This ductal dilatation leads back to the same central region of  increased metabolic activity on comparison FDG PET scan. Combination of findings are concerning for recurrence carcinoma in the RIGHT hepatic lobe. Electronically Signed   By: SSuzy BouchardM.D.   On: 12/17/2020 16:20   IR Radiologist Eval & Mgmt  Result Date: 12/25/2020 Please refer to notes tab for details about interventional procedure. (Op Note)   Labs:  CBC: No results for input(s): WBC, HGB, HCT, PLT in the last 8760 hours.  COAGS: No results for input(s): INR, APTT in the last 8760 hours.  BMP: Recent Labs    12/30/19 1053 03/06/20 0829 03/07/20 0834 09/06/20 0819 11/12/20 0942  NA 140 138 142 138 138  K 3.5 4.0 3.9 3.6 4.4  CL 99 96* 100  94* 96*  CO2 '31 31 29 31 ' 33*  GLUCOSE 136* 156* 161* 211* 255*  BUN '20 20 22 16 18  ' CALCIUM 9.7 9.6 9.9 10.4* 10.4*  CREATININE 1.13 1.50* 1.27* 1.22 1.34*  GFRNONAA >60 47* 58* >60 58*  GFRAA >60 55* >60  --   --     LIVER FUNCTION TESTS: Recent Labs    12/30/19 1053 03/06/20 0829  BILITOT 0.5 0.8  AST 22 36  ALT 23 29  ALKPHOS 133* 108  PROT 8.1 7.4  ALBUMIN 3.8 3.6    TUMOR MARKERS: No results for input(s): AFPTM, CEA, CA199, CHROMGRNA in the last 8760 hours.  Assessment and Plan:  Jeremy Stevenson is a very pleasant 70 yo male with history of colorectal carcinoma and oligometastatic disease to the liver, SP microwave ablation of single right liver lesion 09/29/2018.   Since our last visit, he has had local recurrence at the ablation site, evident by a rising CEA and positive PET findings.  MRI findings are equivocal given the small size of the lesion.    I had a lengthy discussion with Jeremy Stevenson and his wife regarding options for liver-directed therapy, which in this scenario would be repeat ablation such as microwave ablation.    We also discussed the benefits of having conversation with hepatobiliary oncologic surgery team, to talk about surgical options.  I emphasized the favorable findings on the PET, which show  only this single site of disease.    I did recommend an image guided biopsy, which would be useful to confirm the presence of disease at the site, useful for any future possible ablation, and also useful for part of discussion of any surgical team.    I answered all of their questions.    I also discussed with Dr. Benay Spice.    Plan: - Plan for image guided liver lesion biopsy with possible fiducial marker placement.  With Dr. Earleen Newport, [place] guided ([place] in the room) - Dr. Benay Spice will discuss further regarding possible surgical referral for conversation.  - We are happy to discuss image guided ablation further with Jeremy Stevenson, if need be.     Electronically Signed: Corrie Mckusick 12/25/2020, 11:46 AM   I spent a total of    40 Minutes in face to face in clinical consultation, greater than 50% of which was counseling/coordinating care for recurrent colorectal metastasis to liver, possible biopsy, possible image guided ablation.

## 2020-12-27 ENCOUNTER — Other Ambulatory Visit (HOSPITAL_COMMUNITY): Payer: Self-pay | Admitting: Interventional Radiology

## 2020-12-27 ENCOUNTER — Encounter (HOSPITAL_COMMUNITY): Payer: Self-pay

## 2020-12-27 DIAGNOSIS — R160 Hepatomegaly, not elsewhere classified: Secondary | ICD-10-CM

## 2020-12-27 NOTE — Progress Notes (Signed)
Jeremy Stevenson Male, 70 y.o., 1951/06/07  MRN:  [id number] Phone:  [phone] Jerilynn Mages)       PCP:  Monico Blitz, MD Primary Cvg:  Medicare/Medicare Part A And B  Next Appt With Radiology ([facility]-CT 3) 01/03/2021 at 11:00 AM           FW: Schedule CT Guided Liver Lesion Biopsy w/ Possible Fiducial Marker Placement Received: Today  Message Details  Ophelia Shoulder, Arsenio Katz D  Previous Messages  ----- Message -----  From: Randa Spike  Sent: 12/27/2020  9:18 AM EST  To: Harlene Salts  Subject: Schedule CT Guided Liver Lesion Biopsy w/ Po*   Name: Jeremy Stevenson DOB: 12-29-1950   Procedure: CT Guided Liver Lesion Biopsy w/ possible Fiducial Marker Placement ([place] in the room)   Reason for procedure: Liver mass, right lobe   Relevant History: In Epic   Order Physician: Earleen Newport   Office Contact: Me  MCR & [employer] Supp-No precert required   Thank you

## 2020-12-31 ENCOUNTER — Other Ambulatory Visit: Payer: Self-pay | Admitting: *Deleted

## 2020-12-31 DIAGNOSIS — C186 Malignant neoplasm of descending colon: Secondary | ICD-10-CM

## 2020-12-31 NOTE — Progress Notes (Signed)
Per Dr. Benay Spice: Needs referral to Dr. Fayrene Helper at [facility] for consideration of resection liver metastasis site previously treated w/ablation. Surgeon who recently saw patient agreed with this referral.

## 2021-01-01 ENCOUNTER — Telehealth: Payer: Self-pay | Admitting: *Deleted

## 2021-01-01 NOTE — Telephone Encounter (Signed)
Per Dr. Benay Spice: Dr. Barry Dienes thought he was not a surgical candidate in past, but can have him see her again to see if anything has changed. Wife and patient will talk w/family and church friends to see if they could transport them to [place]. Requested nurse call back tomorrow to see what they can do.

## 2021-01-01 NOTE — Telephone Encounter (Signed)
Wife called w/concern that their radiation appointment was cancelled and she has not heard back from them. Does not see Dr. Benay Spice till 01/08/21. They do not want to go to [facility] for the surgical opinion due to high anxiety related to driving to a large campus and not knowing where to go. Asking if there is a Garment/textile technologist who can do the surgery?

## 2021-01-02 ENCOUNTER — Telehealth: Payer: Self-pay | Admitting: *Deleted

## 2021-01-02 NOTE — Telephone Encounter (Signed)
Wife and patient agree to see Dr. Fayrene Helper if Dr. Benay Spice feels it is best. Would like to see him on any day except Monday or Friday for her daughter, who will drive them there. Faxed referral, demographics and chart information to [phone] with appointment request. Email to radiology to push over films to [facility].

## 2021-01-03 ENCOUNTER — Ambulatory Visit (HOSPITAL_COMMUNITY): Payer: Medicare Other

## 2021-01-08 ENCOUNTER — Inpatient Hospital Stay (HOSPITAL_BASED_OUTPATIENT_CLINIC_OR_DEPARTMENT_OTHER): Payer: Medicare Other | Admitting: Oncology

## 2021-01-08 ENCOUNTER — Other Ambulatory Visit: Payer: Self-pay

## 2021-01-08 VITALS — BP 152/85 | HR 69 | Temp 97.7°F | Resp 20 | Ht 74.0 in | Wt 278.5 lb

## 2021-01-08 DIAGNOSIS — I10 Essential (primary) hypertension: Secondary | ICD-10-CM | POA: Diagnosis not present

## 2021-01-08 DIAGNOSIS — C186 Malignant neoplasm of descending colon: Secondary | ICD-10-CM | POA: Diagnosis not present

## 2021-01-08 DIAGNOSIS — C787 Secondary malignant neoplasm of liver and intrahepatic bile duct: Secondary | ICD-10-CM | POA: Diagnosis not present

## 2021-01-08 DIAGNOSIS — D6959 Other secondary thrombocytopenia: Secondary | ICD-10-CM | POA: Diagnosis not present

## 2021-01-08 NOTE — Progress Notes (Signed)
[place] OFFICE PROGRESS NOTE   Diagnosis: Colon cancer  INTERVAL HISTORY:   Jeremy Stevenson returns as scheduled.  He saw Dr. Earleen Newport to consider ablation of the recurrence at the previous ablation site.  Dr. Earleen Newport feels the lesion can be ablated, but recommended surgical consultation.  Jeremy Stevenson is scheduled to see Dr. Ivery Quale on 01/21/2021.  He has no new complaint.  He generally feels well.  He acknowledges depression symptoms.  Objective:  Vital signs in last 24 hours:  Blood pressure (!) 152/85, pulse 69, temperature 97.7 F (36.5 C), temperature source Tympanic, resp. rate 20, height '6\' 2"'  (1.88 m), weight 278 lb 8 oz (126.3 kg), SpO2 96 %.    Lymphatics: No cervical, supraclavicular, axillary, or inguinal nodes Resp: Lungs clear bilaterally Cardio: Regular rate and rhythm GI: No mass, no hepatosplenomegaly Vascular: No leg edema     Lab Results:  Lab Results  Component Value Date   WBC 11.7 (H) 05/26/2019   HGB 12.8 (L) 05/26/2019   HCT 39.2 05/26/2019   MCV 96.8 05/26/2019   PLT 145 (L) 05/26/2019   NEUTROABS 8.4 (H) 05/26/2019    CMP  Lab Results  Component Value Date   NA 138 11/12/2020   K 4.4 11/12/2020   CL 96 (L) 11/12/2020   CO2 33 (H) 11/12/2020   GLUCOSE 255 (H) 11/12/2020   BUN 18 11/12/2020   CREATININE 1.34 (H) 11/12/2020   CALCIUM 10.4 (H) 11/12/2020   PROT 7.4 03/06/2020   ALBUMIN 3.6 03/06/2020   AST 36 03/06/2020   ALT 29 03/06/2020   ALKPHOS 108 03/06/2020   BILITOT 0.8 03/06/2020   GFRNONAA 58 (L) 11/12/2020   GFRAA >60 03/07/2020    Lab Results  Component Value Date   CEA1 19.46 (H) 11/12/2020    Medications: I have reviewed the patient's current medications.   Assessment/Plan: 1. Adenocarcinoma of the descending colon, stage II (T3 N0), status post a left colectomy 01/20/2017 ? MSI-stable, no loss of mismatch repair protein expression ? Elevated preoperative CEA ? Incomplete preoperative  colonoscopy ? Colonoscopy 08/07/2017-multiple polyps removed including tubular adenomas ? CT abdomen/pelvis 02/05/2018-no evidence of metastatic disease. ? Elevated CEA June 2019 ? CTs 06/29/2018-no evidence of metastatic disease, stable mild bilateral iliac adenopathy ? PET scan 2/59/5638-VFIEPPIR hypermetabolic central right liver metastasis;asymmetric hypermetabolism right palatine tonsil with associated mild asymmetric soft tissue fullness on CT images ? MRI liver 08/25/2018-2.6 cm mass in the central right liver consistent with metastasis, no other evidence of metastatic disease ? Ablation and biopsy of right liver lesion 09/29/2018-pathology revealed adenocarcinoma; foundation 1-KRAS G12V, NRAS wildtype, microsatellite stable, tumor mutational burden 1 ? Cycle 1 FOLFOX 10/25/2018 ? Cycle 2 FOLFOX 11/09/2018 ? Cycle 3 FOLFOX 11/23/2018 ? Cycle 4 FOLFOX 12/07/2018 ? Cycle 5 FOLFOX 12/21/2018 (oxaliplatin dose reduced secondary to thrombocytopenia) ? Cycle 6 FOLFOX 01/04/2019 ? Cycle 7 FOLFOX 01/18/2019 ? Cycle 8 FOLFOX 02/01/2019 ? Cycle 9 FOLFOX 02/15/2019 ? MRI abdomen 02/24/2019-ablation site in the liver without residual tumor identified. Mild intrahepatic biliary dilatation distal to the ablation site. Diffuse hepatic steatosis. No new liver lesions identified. ? Cycle 10 FOLFOX 02/28/2019 (oxaliplatin held due to neuropathy and thrombocytopenia) ? Cycle 11 FOLFOX 03/14/2019 (oxaliplatin eliminated from the regimen due to neuropathy, 5-fluorouracil dose adjusted due to diarrhea and tearing) ? Cycle 12 FOLFOX 03/28/2019 (no oxaliplatin, 5-fluorouracil as per treatment 03/14/2019) ? CTs 09/08/2019-no evidence of recurrent disease, no evidence of recurrent disease at the hepatic ablation site, stable renal cysts ?  CT abdomen/pelvis 03/06/2020-stable ablation site, no evidence of disease progression, stable gastric lipoma ? Colonoscopy 05/03/2020-polyp removed from the cecum-hyperplastic  polyp ? Elevated CEA 09/06/2020 ? CTs 09/06/2020-no evidence of recurrent disease, stable ablation change in the right liver ? Further elevation of CEA 11/12/2020 ? PET 11/22/2020- small focus of hypermetabolism in the medial right liver at the previous ablation site, no other evidence of metastatic disease ? MRI liver 12/17/2020-interval contraction of the ablation site within the right hepatic lobe.  No new enhancing lesion to localize recurrence in the right hepatic lobe.  Subtle new duct dilatation in the right hepatic lobe which leads back to the same central region of increased metabolic activity on comparison FDG PET scan.  Combination of findings concerning for recurrent disease in the right hepatic lobe at the previous ablation site.  2. Enlargement of the right testicle-hydrocele-Testicular ultrasound 02/26/2017-negative for mass, small bilateral hydroceles  3. Hypertension  4. Depression  5. Family history of breast and ovarian cancer, negative genetic testing  6.History of left leg edema, pain.  7.Pigmented lesion right lower leg-refer to dermatology 06/30/2018  8. Port-A-Cath placement 10/13/2018  9.Thrombocytopenia secondary to chemotherapy  10.Oxaliplatin neuropathy-trial of gabapentin 12/30/2019     Disposition: Jeremy Stevenson appears unchanged.  He has been diagnosed with an apparent recurrence of colon cancer at the 2019 right liver ablation site.  There was no other clinical or radiologic evidence of metastatic disease.  He will see Dr. Ivery Quale on 01/21/2021 to consider surgical options.  If he is not a resection candidate he may be a candidate for repeat ablation.  He will return for an office visit here in approximately 5 weeks.  We will see him sooner as needed.  Betsy Coder, MD  01/08/2021  9:52 AM

## 2021-01-09 ENCOUNTER — Telehealth: Payer: Self-pay | Admitting: Oncology

## 2021-01-09 NOTE — Telephone Encounter (Signed)
Scheduled appointment per 2/22 los. Spoke to patient's wife who is aware of appointment date and time.

## 2021-01-11 DIAGNOSIS — K654 Sclerosing mesenteritis: Secondary | ICD-10-CM | POA: Diagnosis not present

## 2021-01-11 DIAGNOSIS — C787 Secondary malignant neoplasm of liver and intrahepatic bile duct: Secondary | ICD-10-CM | POA: Diagnosis not present

## 2021-01-11 DIAGNOSIS — C189 Malignant neoplasm of colon, unspecified: Secondary | ICD-10-CM | POA: Diagnosis not present

## 2021-01-11 DIAGNOSIS — C185 Malignant neoplasm of splenic flexure: Secondary | ICD-10-CM | POA: Diagnosis not present

## 2021-01-18 DIAGNOSIS — C787 Secondary malignant neoplasm of liver and intrahepatic bile duct: Secondary | ICD-10-CM | POA: Diagnosis not present

## 2021-01-18 DIAGNOSIS — C189 Malignant neoplasm of colon, unspecified: Secondary | ICD-10-CM | POA: Diagnosis not present

## 2021-01-21 DIAGNOSIS — K838 Other specified diseases of biliary tract: Secondary | ICD-10-CM | POA: Diagnosis not present

## 2021-01-21 DIAGNOSIS — Z9049 Acquired absence of other specified parts of digestive tract: Secondary | ICD-10-CM | POA: Diagnosis not present

## 2021-01-21 DIAGNOSIS — C189 Malignant neoplasm of colon, unspecified: Secondary | ICD-10-CM | POA: Diagnosis not present

## 2021-01-21 DIAGNOSIS — Z01818 Encounter for other preprocedural examination: Secondary | ICD-10-CM | POA: Diagnosis not present

## 2021-01-21 DIAGNOSIS — K7689 Other specified diseases of liver: Secondary | ICD-10-CM | POA: Diagnosis not present

## 2021-01-21 DIAGNOSIS — C787 Secondary malignant neoplasm of liver and intrahepatic bile duct: Secondary | ICD-10-CM | POA: Diagnosis not present

## 2021-01-21 DIAGNOSIS — I10 Essential (primary) hypertension: Secondary | ICD-10-CM | POA: Diagnosis not present

## 2021-01-21 DIAGNOSIS — G629 Polyneuropathy, unspecified: Secondary | ICD-10-CM | POA: Diagnosis not present

## 2021-01-30 ENCOUNTER — Other Ambulatory Visit: Payer: Self-pay | Admitting: Nurse Practitioner

## 2021-01-30 DIAGNOSIS — C186 Malignant neoplasm of descending colon: Secondary | ICD-10-CM

## 2021-02-01 ENCOUNTER — Telehealth: Payer: Self-pay | Admitting: Nurse Practitioner

## 2021-02-01 ENCOUNTER — Other Ambulatory Visit: Payer: Self-pay | Admitting: Nurse Practitioner

## 2021-02-01 ENCOUNTER — Ambulatory Visit: Payer: Medicare Other | Admitting: Nurse Practitioner

## 2021-02-01 DIAGNOSIS — C186 Malignant neoplasm of descending colon: Secondary | ICD-10-CM

## 2021-02-01 NOTE — Telephone Encounter (Signed)
I called Mr. Martinique regarding the appointment scheduled earlier today.  I spoke with his wife.  They were not aware he had an appointment.  He is scheduled to see Dr. Lisbeth Renshaw on 02/05/2021.  They are aware of the plan for SBRT.  We will see him at [facility] in approximately 6 weeks with a CEA.  They will contact the office prior to that visit with any problems.

## 2021-02-04 ENCOUNTER — Telehealth: Payer: Self-pay | Admitting: Nurse Practitioner

## 2021-02-04 NOTE — Progress Notes (Signed)
GI Location of Tumor / Histology: Colon Cancer with liver mets.  Jeremy Stevenson presented for repeat imaging due to elevated CEA in December 2021.  MRI Liver 12/17/2020: interval contraction of the ablation site within the right hepatic lobe.  No new enhancing lesion to localize recurrence in the right hepatic lobe.  Subtle new duct dilatation in the right hepatic lobe which leads back to the same central region of increased metabolic activity on comparison FDG PET scan.  Combination of findings concerning for recurrent disease in the right hepatic lobe at the previous ablation site.  PET 11/22/2020: Small focus of hypermetabolism in the medial right liver at the previous ablation site, no other evidence of metastatic disease.  Biopsies of Colon Mass 05/03/2020   Past/Anticipated interventions by surgeon, if any:  Amy Lupita Leash PA 01/30/2020- [facility] -I spoke with both [name] and his wife and relayed that the group favored SBRT to treat his liver recurrence. -Dr. Fayrene Helper has already communicated the recommendation with Dr. Benay Spice who will arrange for this to be done locally at [facility].   Dr. Fayrene Helper Amy Lupita Leash PA 01/21/2021-[facility] -We discussed recommended treatment options which include either SBRT or a R hepatectomy. -[name] seemed inclined to pursue a non-surgical option. We have recommended the following: --Liver volumetrics to determine if remnant liver would be sufficient to allow for R hepatectomy --Present his case at liver [place] next week for consensus opinion. We will call him afterwards with an update  -Left Colectomy 01/20/2017- Dr. Dalbert Batman  Past/Anticipated interventions by medical oncology, if any:  Dr. Benay Spice 01/08/2021 - He saw Dr. Earleen Newport to consider ablation of the recurrence at the previous ablation site.  Dr. Earleen Newport feels the lesion can be ablated, but recommended surgical consultation.  [name] is scheduled to see Dr. Ivery Quale on 01/21/2021. - If he  is not a resection candidate he may be a candidate for repeat ablation.  -He received FOLFOX 10/2018-03/28/2019   Weight changes, if any: He has lost a couple of pounds.  Bowel/Bladder complaints, if any: No changes to bowels.  He has frequent urination at baseline.  Nausea / Vomiting, if any: No  Pain issues, if any:  No  Any blood per rectum: No    SAFETY ISSUES:  Prior radiation? No  Pacemaker/ICD? No  Possible current pregnancy? No  Is the patient on methotrexate? No  Current Complaints/Details: -Ablation and biopsy of right liver lesion 09/29/2018

## 2021-02-04 NOTE — Telephone Encounter (Signed)
Scheduled per 03/21 scheduled message, patient has been called and notified. 

## 2021-02-05 ENCOUNTER — Ambulatory Visit
Admission: RE | Admit: 2021-02-05 | Discharge: 2021-02-05 | Disposition: A | Discharge status: Home / Self Care | Payer: Medicare Other | Source: Ambulatory Visit | Attending: Radiation Oncology | Admitting: Radiation Oncology

## 2021-02-05 ENCOUNTER — Encounter: Payer: Self-pay | Admitting: Radiation Oncology

## 2021-02-05 ENCOUNTER — Other Ambulatory Visit: Payer: Self-pay

## 2021-02-05 VITALS — BP 136/85 | HR 60 | Temp 97.6°F | Resp 20 | Ht 74.0 in | Wt 275.0 lb

## 2021-02-05 DIAGNOSIS — G629 Polyneuropathy, unspecified: Secondary | ICD-10-CM | POA: Insufficient documentation

## 2021-02-05 DIAGNOSIS — Z8041 Family history of malignant neoplasm of ovary: Secondary | ICD-10-CM | POA: Insufficient documentation

## 2021-02-05 DIAGNOSIS — C787 Secondary malignant neoplasm of liver and intrahepatic bile duct: Secondary | ICD-10-CM | POA: Insufficient documentation

## 2021-02-05 DIAGNOSIS — Z79899 Other long term (current) drug therapy: Secondary | ICD-10-CM | POA: Diagnosis not present

## 2021-02-05 DIAGNOSIS — R160 Hepatomegaly, not elsewhere classified: Secondary | ICD-10-CM

## 2021-02-05 DIAGNOSIS — Z808 Family history of malignant neoplasm of other organs or systems: Secondary | ICD-10-CM | POA: Insufficient documentation

## 2021-02-05 DIAGNOSIS — I10 Essential (primary) hypertension: Secondary | ICD-10-CM | POA: Insufficient documentation

## 2021-02-05 DIAGNOSIS — C186 Malignant neoplasm of descending colon: Secondary | ICD-10-CM | POA: Insufficient documentation

## 2021-02-05 DIAGNOSIS — Z803 Family history of malignant neoplasm of breast: Secondary | ICD-10-CM | POA: Insufficient documentation

## 2021-02-05 HISTORY — DX: Malignant neoplasm of colon, unspecified: C18.9

## 2021-02-05 LAB — BASIC METABOLIC PANEL - CANCER CENTER ONLY
Anion gap: 10 (ref 5–15)
BUN: 25 mg/dL — ABNORMAL HIGH (ref 8–23)
CO2: 31 mmol/L (ref 22–32)
Calcium: 10.5 mg/dL — ABNORMAL HIGH (ref 8.9–10.3)
Chloride: 95 mmol/L — ABNORMAL LOW (ref 98–111)
Creatinine: 1.27 mg/dL — ABNORMAL HIGH (ref 0.61–1.24)
GFR, Estimated: >60 mL/min (ref >60)
Glucose, Bld: 163 mg/dL — ABNORMAL HIGH (ref 70–99)
Potassium: 3.6 mmol/L (ref 3.5–5.1)
Sodium: 136 mmol/L (ref 135–145)

## 2021-02-05 NOTE — Progress Notes (Signed)
Radiation Oncology         [phone] ________________________________  Name: Jeremy Stevenson        MRN: [id number]  Date of Service: 02/05/2021 DOB: 03/31/1951  XY:BFXO, Weldon Picking, MD  Monico Blitz, MD     REFERRING PHYSICIAN: Monico Blitz, MD   DIAGNOSIS: The primary encounter diagnosis was Cancer of descending colon [facility]). A diagnosis of Liver mass, right lobe was also pertinent to this visit.   HISTORY OF PRESENT ILLNESS: Jeremy Stevenson is a 70 y.o. male seen at the request of Dr. Benay Spice for a diagnosis of stage II adenocarcinoma of the left colon for which he underwent left colectomy in March 2018, he was followed in surveillance until he developed recurrent metastatic disease in the liver in September 2019.  By MRI in October 2019 this measured 2.6 cm in the central right liver and the patient underwent biopsy and microwave ablation on 09/29/2018 his pathology was consistent with recurrent adenocarcinoma consistent with his prior cancer diagnosis.  He received 9 cycles of FOLFOX between December 2019 and March 2020, fortunately disease in the liver was stabilized by treatment he did then went on to continue FOLFOX with some changes in oxaliplatin and his last cycle appears to have been on 03/28/2019.  No recurrent disease was seen by imaging but CEA elevation continued in December 2021 a PET scan on 11/22/2020 showed a small focus of hypermetabolism in the medial right liver at the previous ablation site and MRI on 11/19/2020 showed no new enhancing lesion but subtle new duct dilatation in the right hepatic lobe.  His case was discussed further and while he could consider surgical resection, his surgeons at [facility] felt he would do better with local therapy that would be less invasive such as radiotherapy.  He is seen today to discuss options of stereotactic body radiotherapy.    PREVIOUS RADIATION THERAPY: No   PAST MEDICAL HISTORY:  Past Medical History:  Diagnosis Date  . Colon cancer ([place])    . Depression   . Genetic testing 03/16/2017   [name] underwent genetic counseling and testing for hereditary cancer syndromes on 03/03/2017. His results were negative for mutations in all 46 genes analyzed by [employer]'s Common Hereditary Cancers Panel. Genes analyzed include: APC, ATM, AXIN2, BARD1, BMPR1A, BRCA1, BRCA2, BRIP1, CDH1, CDKN2A, CHEK2, CTNNA1, DICER1, EPCAM, GREM1, HOXB13, KIT, MEN1, MLH1, MSH2, MSH3, MSH6, MUTYH, NBN, NF1, NT  . Hypertension   . Neoplasm of uncertain behavior of descending colon 01/20/2017  . Peripheral neuropathy        PAST SURGICAL HISTORY: Past Surgical History:  Procedure Laterality Date  . BIOPSY  05/03/2020   Procedure: BIOPSY;  Surgeon: Rogene Houston, MD;  [facility]: [facility];  Service: Endoscopy;;  . COLONOSCOPY N/A 01/02/2017   Procedure: COLONOSCOPY;  Surgeon: Rogene Houston, MD;  [facility]: [facility];  Service: Endoscopy;  Laterality: N/A;  1000  . COLONOSCOPY N/A 08/07/2017   Procedure: COLONOSCOPY;  Surgeon: Rogene Houston, MD;  [facility]: [facility];  Service: Endoscopy;  Laterality: N/A;  1045 - per Lelon Frohlich LM of new time  . COLONOSCOPY N/A 05/03/2020   Procedure: COLONOSCOPY;  Surgeon: Rogene Houston, MD;  [facility]: [facility];  Service: Endoscopy;  Laterality: N/A;  0830  . HERNIA REPAIR     70 years old  . IR IMAGING GUIDED PORT INSERTION  10/13/2018  . IR RADIOLOGIST EVAL & MGMT  09/07/2018  . IR RADIOLOGIST EVAL & MGMT  11/03/2018  . IR RADIOLOGIST EVAL & MGMT  10/25/2019  . IR RADIOLOGIST EVAL & MGMT  03/29/2020  . IR RADIOLOGIST EVAL & MGMT  12/25/2020  . IR REMOVAL TUN ACCESS W/ PORT W/O FL MOD SED  05/26/2019  . LAPAROSCOPIC SIGMOID COLECTOMY N/A 01/20/2017   Procedure: LAPAROSCOPIC LEFT COLECTOMY, TAKEDOWN SPLENIC FLEXURE, AND BIOPSY OF PERITONEAL NODULE;  Surgeon: Fanny Skates, MD;  Location: [facility];  Service: General;  Laterality: N/A;  . POLYPECTOMY  05/03/2020   Procedure: POLYPECTOMY;  Surgeon: Rogene Houston, MD;   Location: [facility];  Service: Endoscopy;;  . RADIOLOGY WITH ANESTHESIA N/A 09/29/2018   Procedure: CT WITH ANESTHESIA/MICROWAVE THERMAL ABLATION OF LIVER, BIOPSY;  Surgeon: Corrie Mckusick, DO;  Location: [facility];  Service: Anesthesiology;  Laterality: N/A;  . TONSILLECTOMY     70 years old     FAMILY HISTORY:  Family History  Problem Relation Age of Onset  . Breast cancer Mother 36  . Skin cancer Mother   . Ovarian cancer Sister 72       d.50 due to metastases  . Skin cancer Maternal Uncle   . Skin cancer Cousin 49  . Skin cancer Cousin 60     SOCIAL HISTORY:  reports that he has never smoked. He has never used smokeless tobacco. He reports that he does not drink alcohol and does not use drugs. The patient is married and lives in [place].    ALLERGIES: Codeine   MEDICATIONS:  Current Outpatient Medications  Medication Sig Dispense Refill  . amLODipine (NORVASC) 5 MG tablet Take 5 mg by mouth daily.     . chlorthalidone (HYGROTON) 25 MG tablet Take 25 mg by mouth daily.     . citalopram (CELEXA) 20 MG tablet Take 20 mg by mouth daily.     Marland Kitchen losartan (COZAAR) 100 MG tablet Take 100 mg by mouth daily.     . metoprolol tartrate (LOPRESSOR) 100 MG tablet Take 1 tablet (100 mg total) by mouth 2 (two) times daily. 60 tablet 1  . potassium chloride SA (K-DUR,KLOR-CON) 20 MEQ tablet Take 1 tablet (20 mEq total) by mouth daily. 30 tablet 1  . ibuprofen (ADVIL,MOTRIN) 200 MG tablet Take 200-400 mg by mouth every 8 (eight) hours as needed for moderate pain.  (Patient not taking: No sig reported)    . LORazepam (ATIVAN) 0.5 MG tablet Take 1 tablet (0.5 mg total) by mouth 2 (two) times daily as needed for anxiety. (Patient not taking: Reported on 02/05/2021) 60 tablet 0   No current facility-administered medications for this encounter.     REVIEW OF SYSTEMS: On review of systems, the patient reports that he is doing well overall. He denies any chest pain, shortness of breath, cough,  fevers, chills, night sweats, unintended weight changes. He denies any bowel or bladder disturbances, and denies abdominal pain, nausea or vomiting. He does have residual numbness in his fingertips and toes since chemotherapy. He otherwise denies any new musculoskeletal or joint aches or pains. A complete review of systems is obtained and is otherwise negative.     PHYSICAL EXAM:  Wt Readings from Last 3 Encounters:  02/05/21 275 lb (124.7 kg)  01/08/21 278 lb 8 oz (126.3 kg)  12/19/20 278 lb (126.1 kg)   Temp Readings from Last 3 Encounters:  02/05/21 97.6 F (36.4 C)  01/08/21 97.7 F (36.5 C) (Tympanic)  12/19/20 98.7 F (37.1 C) (Tympanic)   BP Readings from Last 3 Encounters:  02/05/21 136/85  01/08/21 (!) 152/85  12/19/20 (!) 143/80   Pulse Readings from Last 3 Encounters:  02/05/21 60  01/08/21 69  12/19/20 60   Pain Assessment Pain Score: 0-No pain/10  In general this is a well appearing caucasian male in no acute distress. He's alert and oriented x4 and appropriate throughout the examination. Cardiopulmonary assessment is negative for acute distress and he exhibits normal effort.  Marland Kitchen   ECOG = 0  0 - Asymptomatic (Fully active, able to carry on all predisease activities without restriction)  1 - Symptomatic but completely ambulatory (Restricted in physically strenuous activity but ambulatory and able to carry out work of a light or sedentary nature. For example, light housework, office work)  2 - Symptomatic, <50% in bed during the day (Ambulatory and capable of all self care but unable to carry out any work activities. Up and about more than 50% of waking hours)  3 - Symptomatic, >50% in bed, but not bedbound (Capable of only limited self-care, confined to bed or chair 50% or more of waking hours)  4 - Bedbound (Completely disabled. Cannot carry on any self-care. Totally confined to bed or chair)  5 - Death   Eustace Pen MM, Creech RH, Tormey DC, et al. [phone]).  "Toxicity and response criteria of the [facility]". [place] Oncol. 5 (6): 649-55    LABORATORY DATA:  Lab Results  Component Value Date   WBC 11.7 (H) 05/26/2019   HGB 12.8 (L) 05/26/2019   HCT 39.2 05/26/2019   MCV 96.8 05/26/2019   PLT 145 (L) 05/26/2019   Lab Results  Component Value Date   NA 138 11/12/2020   K 4.4 11/12/2020   CL 96 (L) 11/12/2020   CO2 33 (H) 11/12/2020   Lab Results  Component Value Date   ALT 29 03/06/2020   AST 36 03/06/2020   ALKPHOS 108 03/06/2020   BILITOT 0.8 03/06/2020      RADIOGRAPHY: No results found.     IMPRESSION/PLAN: 1. Progressive Metastatic Stage IIA, cT3N0, adenocarcinoma of the descending colon with liver metastases. Dr. Lisbeth Renshaw discusses the pathology findings and reviews the nature of liver disease and the options of additional ablative therapy. Additional interventional radiology procedures could be considered as well as surgery, but the patient is looking for less invasive treatment. Dr. Lisbeth Renshaw discussed the application of stereotactic body radiotherapy (SBRT) as an ablative modality. The patient would need fiducial marker placement however for treatment to be as precise as possible. We will reach out to Dr. Earleen Newport who has done all of his prior IR interventions, and whom the patient is very comfortable with to see if this can be performed. We discussed the risks, benefits, short, and long term effects of radiotherapy, as well as the curative intent, and the patient is interested in proceeding. Dr. Lisbeth Renshaw discusses the delivery and logistics of radiotherapy and anticipates a course of 5 fractopms  of radiotherapy. Written consent is obtained and placed in the chart, a copy was provided to the patient. He will be contacted to coordinate IV placement with nursing and simulation a few days following fiducial marker placement. We will obtain labs to determine his kidney function as well as update in CEA prior to  therapy for more up to date baseline value prior to treatment.   In a visit lasting 60 minutes, greater than 50% of the time was spent face to face discussing the patient's condition, in preparation for the discussion, and coordinating the patient's care.  The above documentation reflects my direct findings during this shared patient visit. Please see the separate note by Dr. Lisbeth Renshaw on this date for the remainder of the patient's plan of care.    Carola Rhine, [facility]   **Disclaimer: This note was dictated with voice recognition software. Similar sounding words can inadvertently be transcribed and this note may contain transcription errors which may not have been corrected upon publication of note.**

## 2021-02-06 ENCOUNTER — Encounter: Payer: Self-pay | Admitting: General Practice

## 2021-02-06 LAB — CEA (IN HOUSE-CHCC): CEA (CHCC-In House): 32.33 ng/mL — ABNORMAL HIGH (ref 0.00–5.00)

## 2021-02-06 NOTE — Progress Notes (Signed)
[place] Psychosocial Distress Screening Clinical Social Work  Clinical Social Work was referred by distress screening protocol.  The patient scored a 5 on the Psychosocial Distress Thermometer which indicates moderate distress. Clinical Social Worker contacted patient by phone to assess for distress and other psychosocial needs. Patient unavailable, spoke w wife.  Plan is for radiation now, felt they were well informed about what to expect.  Finished chemotherapy so they are familiar with treatment processes.  Wife is supportive, transports and can provide care at home as needed.  Briefly described [place] and encouraged them to reach out as needed throughout treatment.    ONCBCN DISTRESS SCREENING 02/05/2021  Screening Type Initial Screening  Distress experienced in past week (1-10) 5  Emotional problem type Nervousness/Anxiety  Information Concerns Type Lack of info about diagnosis;Lack of info about treatment  Other Contact via [phone]    Clinical Social Worker follow up needed: No.  If yes, follow up plan:  Beverely Pace, [place], LCSW Clinical Social Worker Phone:  [phone]

## 2021-02-11 ENCOUNTER — Inpatient Hospital Stay: Payer: Medicare Other | Attending: Oncology | Admitting: Nurse Practitioner

## 2021-02-11 ENCOUNTER — Telehealth: Payer: Self-pay | Admitting: Radiation Oncology

## 2021-02-11 NOTE — Telephone Encounter (Signed)
I spoke with the patient's wife about scheduling simulation on 02/18/21 following fiducial marker placement on 02/14/21.

## 2021-02-13 DIAGNOSIS — E7849 Other hyperlipidemia: Secondary | ICD-10-CM | POA: Diagnosis not present

## 2021-02-13 DIAGNOSIS — M169 Osteoarthritis of hip, unspecified: Secondary | ICD-10-CM | POA: Diagnosis not present

## 2021-02-13 DIAGNOSIS — C189 Malignant neoplasm of colon, unspecified: Secondary | ICD-10-CM | POA: Diagnosis not present

## 2021-02-13 DIAGNOSIS — M1712 Unilateral primary osteoarthritis, left knee: Secondary | ICD-10-CM | POA: Diagnosis not present

## 2021-02-14 ENCOUNTER — Other Ambulatory Visit: Payer: Self-pay

## 2021-02-14 ENCOUNTER — Ambulatory Visit (HOSPITAL_COMMUNITY)
Admission: RE | Admit: 2021-02-14 | Discharge: 2021-02-14 | Disposition: A | Discharge status: Home / Self Care | Payer: Medicare Other | Source: Ambulatory Visit | Attending: Interventional Radiology | Admitting: Interventional Radiology

## 2021-02-14 ENCOUNTER — Other Ambulatory Visit: Payer: Self-pay | Admitting: Radiology

## 2021-02-14 DIAGNOSIS — Z85038 Personal history of other malignant neoplasm of large intestine: Secondary | ICD-10-CM | POA: Insufficient documentation

## 2021-02-14 DIAGNOSIS — K7689 Other specified diseases of liver: Secondary | ICD-10-CM | POA: Diagnosis not present

## 2021-02-14 DIAGNOSIS — C19 Malignant neoplasm of rectosigmoid junction: Secondary | ICD-10-CM | POA: Diagnosis not present

## 2021-02-14 DIAGNOSIS — K7290 Hepatic failure, unspecified without coma: Secondary | ICD-10-CM | POA: Diagnosis not present

## 2021-02-14 DIAGNOSIS — C787 Secondary malignant neoplasm of liver and intrahepatic bile duct: Secondary | ICD-10-CM | POA: Diagnosis not present

## 2021-02-14 DIAGNOSIS — R160 Hepatomegaly, not elsewhere classified: Secondary | ICD-10-CM | POA: Insufficient documentation

## 2021-02-14 DIAGNOSIS — C189 Malignant neoplasm of colon, unspecified: Secondary | ICD-10-CM | POA: Diagnosis not present

## 2021-02-14 LAB — CBC WITH DIFFERENTIAL/PLATELET
Abs Immature Granulocytes: 0.1 10*3/uL — ABNORMAL HIGH (ref 0.00–0.07)
Basophils Absolute: 0.1 10*3/uL (ref 0.0–0.1)
Basophils Relative: 1 %
Eosinophils Absolute: 0.3 10*3/uL (ref 0.0–0.5)
Eosinophils Relative: 3 %
HCT: 37.6 % — ABNORMAL LOW (ref 39.0–52.0)
Hemoglobin: 12.5 g/dL — ABNORMAL LOW (ref 13.0–17.0)
Immature Granulocytes: 1 %
Lymphocytes Relative: 17 %
Lymphs Abs: 1.7 10*3/uL (ref 0.7–4.0)
MCH: 29.6 pg (ref 26.0–34.0)
MCHC: 33.2 g/dL (ref 30.0–36.0)
MCV: 89.1 fL (ref 80.0–100.0)
Monocytes Absolute: 0.7 10*3/uL (ref 0.1–1.0)
Monocytes Relative: 7 %
Neutro Abs: 7.1 10*3/uL (ref 1.7–7.7)
Neutrophils Relative %: 71 %
Platelets: 188 10*3/uL (ref 150–400)
RBC: 4.22 MIL/uL (ref 4.22–5.81)
RDW: 14.1 % (ref 11.5–15.5)
WBC: 9.9 10*3/uL (ref 4.0–10.5)
nRBC: 0 % (ref 0.0–0.2)

## 2021-02-14 LAB — COMPREHENSIVE METABOLIC PANEL
ALT: 29 U/L (ref 0–44)
AST: 32 U/L (ref 15–41)
Albumin: 3.4 g/dL — ABNORMAL LOW (ref 3.5–5.0)
Alkaline Phosphatase: 96 U/L (ref 38–126)
Anion gap: 11 (ref 5–15)
BUN: 19 mg/dL (ref 8–23)
CO2: 26 mmol/L (ref 22–32)
Calcium: 9.3 mg/dL (ref 8.9–10.3)
Chloride: 97 mmol/L — ABNORMAL LOW (ref 98–111)
Creatinine, Ser: 1.32 mg/dL — ABNORMAL HIGH (ref 0.61–1.24)
GFR, Estimated: 59 mL/min — ABNORMAL LOW (ref >60)
Glucose, Bld: 181 mg/dL — ABNORMAL HIGH (ref 70–99)
Potassium: 3.5 mmol/L (ref 3.5–5.1)
Sodium: 134 mmol/L — ABNORMAL LOW (ref 135–145)
Total Bilirubin: 1 mg/dL (ref 0.3–1.2)
Total Protein: 7 g/dL (ref 6.5–8.1)

## 2021-02-14 LAB — PROTIME-INR
INR: 1 (ref 0.8–1.2)
Prothrombin Time: 12.6 seconds (ref 11.4–15.2)

## 2021-02-14 MED ORDER — MIDAZOLAM HCL 2 MG/2ML IJ SOLN
INTRAMUSCULAR | Status: AC | PRN
  Administered 2021-02-14: 1 mg via INTRAVENOUS

## 2021-02-14 MED ORDER — MIDAZOLAM HCL 2 MG/2ML IJ SOLN
INTRAMUSCULAR | Status: AC
  Filled 2021-02-14: qty 2

## 2021-02-14 MED ORDER — FENTANYL CITRATE (PF) 100 MCG/2ML IJ SOLN
INTRAMUSCULAR | Status: AC | PRN
  Administered 2021-02-14: 50 ug via INTRAVENOUS

## 2021-02-14 MED ORDER — SODIUM CHLORIDE 0.9 % IV SOLN: INTRAVENOUS | Status: DC

## 2021-02-14 MED ORDER — FENTANYL CITRATE (PF) 100 MCG/2ML IJ SOLN
INTRAMUSCULAR | Status: AC
  Filled 2021-02-14: qty 2

## 2021-02-14 MED ORDER — LIDOCAINE HCL 1 % IJ SOLN
INTRAMUSCULAR | Status: AC
  Filled 2021-02-14: qty 20

## 2021-02-14 NOTE — Progress Notes (Signed)
Discharged via wheelchair no C/o Pain or discomfort

## 2021-02-14 NOTE — H&P (Signed)
Referring Physician(s): Sherrill,B/Perkins,A, PA-C  Supervising Physician: Corrie Mckusick  Patient Status:  [facility] OP  Chief Complaint: "I'm having a liver biopsy and marker placement"   Subjective: Pt known to IR service from right liver lesion bx adjacent to portal vein bifurcation along with [place] in 2019, port a cath placement in 2019 with removal in 2020. He has a hx of metastatic colon cancer with prior left colectomy in 2018 along with chemotherapy. Recent imaging has revealed:  1. Interval contraction of the ablation site within the RIGHT hepatic lobe. 2. No new enhancing lesion to localize recurrence in the RIGHT hepatic lobe 3. However, there is subtle new duct dilatation in the RIGHT hepatic lobe (segment 7). This ductal dilatation leads back to the same central region of increased metabolic activity on comparison FDG PET scan. Combination of findings are concerning for recurrence carcinoma in the RIGHT hepatic lobe.  Pt's most recent CEA on 3/22 was 32.33 up from 19.46  3 months ago. He presents today for image guided biopsy of the area of possible recurrence in the liver and fiducial marker placement. He denies fever,HA,CP,dyspnea, cough, abd /back pain,N/V or bleeding. He does have paresthesias of his hands and feet.    Past Medical History:  Diagnosis Date  . Colon cancer ([place])   . Depression   . Genetic testing 03/16/2017   Mr. Martinique underwent genetic counseling and testing for hereditary cancer syndromes on 03/03/2017. His results were negative for mutations in all 46 genes analyzed by [employer]'s Common Hereditary Cancers Panel. Genes analyzed include: APC, ATM, AXIN2, BARD1, BMPR1A, BRCA1, BRCA2, BRIP1, CDH1, CDKN2A, CHEK2, CTNNA1, DICER1, EPCAM, GREM1, HOXB13, KIT, MEN1, MLH1, MSH2, MSH3, MSH6, MUTYH, NBN, NF1, NT  . Hypertension   . Neoplasm of uncertain behavior of descending colon 01/20/2017  . Peripheral neuropathy    Past Surgical History:  Procedure  Laterality Date  . BIOPSY  05/03/2020   Procedure: BIOPSY;  Surgeon: Rogene Houston, MD;  [facility];  Service: Endoscopy;;  . COLONOSCOPY N/A 01/02/2017   Procedure: COLONOSCOPY;  Surgeon: Rogene Houston, MD;  [facility];  Service: Endoscopy;  Laterality: N/A;  1000  . COLONOSCOPY N/A 08/07/2017   Procedure: COLONOSCOPY;  Surgeon: Rogene Houston, MD;  [facility];  Service: Endoscopy;  Laterality: N/A;  1045 - per Lelon Frohlich LM of new time  . COLONOSCOPY N/A 05/03/2020   Procedure: COLONOSCOPY;  Surgeon: Rogene Houston, MD;  [facility];  Service: Endoscopy;  Laterality: N/A;  0830  . HERNIA REPAIR     70 years old  . IR IMAGING GUIDED PORT INSERTION  10/13/2018  . IR RADIOLOGIST EVAL & MGMT  09/07/2018  . IR RADIOLOGIST EVAL & MGMT  11/03/2018  . IR RADIOLOGIST EVAL & MGMT  10/25/2019  . IR RADIOLOGIST EVAL & MGMT  03/29/2020  . IR RADIOLOGIST EVAL & MGMT  12/25/2020  . IR REMOVAL TUN ACCESS W/ PORT W/O FL MOD SED  05/26/2019  . LAPAROSCOPIC SIGMOID COLECTOMY N/A 01/20/2017   Procedure: LAPAROSCOPIC LEFT COLECTOMY, TAKEDOWN SPLENIC FLEXURE, AND BIOPSY OF PERITONEAL NODULE;  Surgeon: Fanny Skates, MD;  Location: [facility];  Service: General;  Laterality: N/A;  . POLYPECTOMY  05/03/2020   Procedure: POLYPECTOMY;  Surgeon: Rogene Houston, MD;  [facility];  Service: Endoscopy;;  . RADIOLOGY WITH ANESTHESIA N/A 09/29/2018   Procedure: CT WITH ANESTHESIA/MICROWAVE THERMAL ABLATION OF LIVER, BIOPSY;  Surgeon: Corrie Mckusick, DO;  Location:  [facility];  Service: Anesthesiology;  Laterality: N/A;  . TONSILLECTOMY     70 years old     Allergies: Codeine  Medications: Prior to Admission medications   Medication Sig Start Date End Date Taking? Authorizing Provider  amLODipine (NORVASC) 5 MG tablet Take 5 mg by mouth daily.  10/05/16  Yes [provider]  chlorthalidone (HYGROTON) 25 MG tablet Take 25 mg by mouth daily.    Yes [provider]  citalopram (CELEXA) 20 MG tablet Take 20 mg by mouth daily.  12/06/16  Yes [provider]  ibuprofen (ADVIL,MOTRIN) 200 MG tablet Take 200-400 mg by mouth every 8 (eight) hours as needed for moderate pain.   Yes [provider]  LORazepam (ATIVAN) 0.5 MG tablet Take 1 tablet (0.5 mg total) by mouth 2 (two) times daily as needed for anxiety. 03/28/19  Yes Owens Shark, NP  losartan (COZAAR) 100 MG tablet Take 100 mg by mouth daily.  11/29/16  Yes [provider]  metoprolol tartrate (LOPRESSOR) 100 MG tablet Take 1 tablet (100 mg total) by mouth 2 (two) times daily. 04/26/19  Yes Ladell Pier, MD  potassium chloride SA (K-DUR,KLOR-CON) 20 MEQ tablet Take 1 tablet (20 mEq total) by mouth daily. 02/15/19  Yes Owens Shark, NP     Vital Signs: BP (!) 148/85   Pulse 63   Temp 98.6 F (37 C) (Oral)   Ht '6\' 2"'  (1.88 m)   Wt 275 lb (124.7 kg)   SpO2 98%   BMI 35.31 kg/m   Physical Exam awake/alert; chest- CTA bilat; heart- RRR; abd- soft,+BS,NT; no sig LE edema  Imaging: No results found.  Labs:  CBC: Recent Labs    02/14/21 0922  WBC 9.9  HGB 12.5*  HCT 37.6*  PLT 188    COAGS: No results for input(s): INR, APTT in the last 8760 hours.  BMP: Recent Labs    03/06/20 0829 03/07/20 0834 09/06/20 0819 11/12/20 0942 02/05/21 1540  NA 138 142 138 138 136  K 4.0 3.9 3.6 4.4 3.6  CL 96* 100 94* 96* 95*  CO2 '31 29 31 ' 33* 31  GLUCOSE 156* 161* 211* 255* 163*  BUN '20 22 16 18 ' 25*  CALCIUM 9.6 9.9 10.4* 10.4* 10.5*  CREATININE 1.50* 1.27* 1.22 1.34* 1.27*  GFRNONAA 47* 58* >60 58* >60  GFRAA 55* >60  --   --   --     LIVER FUNCTION TESTS: Recent Labs    03/06/20 0829  BILITOT 0.8  AST 36  ALT 29  ALKPHOS 108  PROT 7.4  ALBUMIN 3.6    Assessment and Plan: Pt known to IR service from right liver lesion bx adjacent to portal vein bifurcation along with [place] in 2019, port a cath placement in 2019 with removal in 2020. He  has a hx of metastatic colon cancer with prior left colectomy in 2018 along with chemotherapy. Recent imaging has revealed:  1. Interval contraction of the ablation site within the RIGHT hepatic lobe. 2. No new enhancing lesion to localize recurrence in the RIGHT hepatic lobe 3. However, there is subtle new duct dilatation in the RIGHT hepatic lobe (segment 7). This ductal dilatation leads back to the same central region of increased metabolic activity on comparison FDG PET scan. Combination of findings are concerning for recurrence carcinoma in the RIGHT hepatic lobe.  Pt's most recent CEA on 3/22 was 32.33 up from 19.46  3 months ago. He presents today  for image guided biopsy of the area of possible recurrence in the liver and fiducial marker placement.Risks and benefits of procedure was discussed with the patient  including, but not limited to bleeding, infection, damage to adjacent structures or low yield requiring additional tests.  All of the questions were answered and there is agreement to proceed.  Consent signed and in chart.     Electronically Signed: D. Rowe Robert, PA-C 02/14/2021, 9:37 AM   I spent a total of 25 minutes at the the patient's bedside AND on the patient's hospital floor or unit, greater than 50% of which was counseling/coordinating care for image guided liver lesion biopsy and hepatic fiducial marker placement

## 2021-02-14 NOTE — Procedures (Signed)
Interventional Radiology Procedure Note  Procedure:   [place] and CT guided right liver mass biopsy, with fiducial marker placement (x 2)   Complications: None  Recommendations:  - Ok to shower tomorrow - 2 hr recovery - advance diet - Do not submerge for 7 days - Routine care   Signed,  Dulcy Fanny. Earleen Newport, DO

## 2021-02-14 NOTE — Discharge Instructions (Addendum)
Liver Biopsy, Care After These instructions give you information on caring for yourself after your procedure. Your doctor may also give you more specific instructions. Call your doctor if you have any problems or questions after your procedure. What can I expect after the procedure? After the procedure, it is common to have:  Pain and soreness where the biopsy was done.  Bruising around the area where the biopsy was done.  Sleepiness and be tired for a few days. Follow these instructions at home: Medicines  Take over-the-counter and prescription medicines only as told by your doctor.  If you were prescribed an antibiotic medicine, take it as told by your doctor. Do not stop taking the antibiotic even if you start to feel better.  Do not take medicines such as aspirin and ibuprofen. These medicines can thin your blood. Do not take these medicines unless your doctor tells you to take them.  If you are taking prescription pain medicine, take actions to prevent or treat constipation. Your doctor may recommend that you: ? Drink enough fluid to keep your pee (urine) clear or pale yellow. ? Take over-the-counter or prescription medicines. ? Eat foods that are high in fiber, such as fresh fruits and vegetables, whole grains, and beans. ? Limit foods that are high in fat and processed sugars, such as fried and sweet foods. Caring for your cut  Follow instructions from your doctor about how to take care of your cuts from surgery (incisions). Make sure you: ? Wash your hands with soap and water before you change your bandage (dressing). If you cannot use soap and water, use hand sanitizer. ? Change your bandage as told by your doctor. ? Leave stitches (sutures), skin glue, or skin tape (adhesive) strips in place. They may need to stay in place for 2 weeks or longer. If tape strips get loose and curl up, you may trim the loose edges. Do not remove tape strips completely unless your doctor says it is  okay.  Check your cuts every day for signs of infection. Check for: ? Redness, swelling, or more pain. ? Fluid or blood. ? Pus or a bad smell. ? Warmth.  Do not take baths, swim, or use a hot tub until your doctor says it is okay to do so. Activity  Rest at home for 1-2 days or as told by your doctor. ? Avoid sitting for a long time without moving. Get up to take short walks every 1-2 hours.  Return to your normal activities as told by your doctor. Ask what activities are safe for you.  Do not do these things in the first 24 hours: ? Drive. ? Use machinery. ? Take a bath or shower.  Do not lift more than 10 pounds (4.5 kg) or play contact sports for the first 2 weeks.   General instructions  Do not drink alcohol in the first week after the procedure.  Have someone stay with you for at least 24 hours after the procedure.  Get your test results. Ask your doctor or the department that is doing the test: ? When will my results be ready? ? How will I get my results? ? What are my treatment options? ? What other tests do I need? ? What are my next steps?  Keep all follow-up visits as told by your doctor. This is important.   Contact a doctor if:  A cut bleeds and leaves more than just a small spot of blood.  A cut is red,   puffs up (swells), or hurts more than before.  Fluid or something else comes from a cut.  A cut smells bad.  You have a fever or chills. Get help right away if:  You have swelling, bloating, or pain in your belly (abdomen).  You get dizzy or faint.  You have a rash.  You feel sick to your stomach (nauseous) or throw up (vomit).  You have trouble breathing, feel short of breath, or feel faint.  Your chest hurts.  You have problems talking or seeing.  You have trouble with your balance or moving your arms or legs. Summary  After the procedure, it is common to have pain, soreness, bruising, and tiredness.  Your doctor will tell you how to  take care of yourself at home. Change your bandage, take your medicines, and limit your activities as told by your doctor.  Call your doctor if you have symptoms of infection. Get help right away if your belly swells, your cut bleeds a lot, or you have trouble talking or breathing. This information is not intended to replace advice given to you by your health care provider. Make sure you discuss any questions you have with your health care provider. Document Revised: 11/12/2017 Document Reviewed: 11/13/2017 Elsevier Patient Education  2021 Elsevier Inc. Moderate Conscious Sedation, Adult Sedation is the use of medicines to promote relaxation and to relieve discomfort and anxiety. Moderate conscious sedation is a type of sedation. Under moderate conscious sedation, you are less alert than normal, but you are still able to respond to instructions, touch, or both. Moderate conscious sedation is used during short medical and dental procedures. It is milder than deep sedation, which is a type of sedation under which you cannot be easily woken up. It is also milder than general anesthesia, which is the use of medicines to make you unconscious. Moderate conscious sedation allows you to return to your regular activities sooner. Tell a health care provider about:  Any allergies you have.  All medicines you are taking, including vitamins, herbs, eye drops, creams, and over-the-counter medicines.  Any use of steroids. This includes steroids taken by mouth or as a cream.  Any problems you or family members have had with sedatives and anesthetic medicines.  Any blood disorders you have.  Any surgeries you have had.  Any medical conditions you have, such as sleep apnea.  Whether you are pregnant or may be pregnant.  Any use of cigarettes, alcohol, marijuana, or drugs. What are the risks? Generally, this is a safe procedure. However, problems may occur, including:  Getting too much medicine  (oversedation).  Nausea.  Allergic reaction to medicines.  Trouble breathing. If this happens, a breathing tube may be used. It will be removed when you are awake and breathing on your own.  Heart trouble.  Lung trouble.  Confusion that gets better with time (emergence delirium). What happens before the procedure? Staying hydrated Follow instructions from your health care provider about hydration, which may include:  Up to 2 hours before the procedure - you may continue to drink clear liquids, such as water, clear fruit juice, black coffee, and plain tea. Eating and drinking restrictions Follow instructions from your health care provider about eating and drinking, which may include:  8 hours before the procedure - stop eating heavy meals or foods, such as meat, fried foods, or fatty foods.  6 hours before the procedure - stop eating light meals or foods, such as toast or cereal.  6 hours   before the procedure - stop drinking milk or drinks that contain milk.  2 hours before the procedure - stop drinking clear liquids. Medicines Ask your health care provider about:  Changing or stopping your regular medicines. This is especially important if you are taking diabetes medicines or blood thinners.  Taking medicines such as aspirin and ibuprofen. These medicines can thin your blood. Do not take these medicines unless your health care provider tells you to take them.  Taking over-the-counter medicines, vitamins, herbs, and supplements. Tests and exams  You will have a physical exam.  You may have blood tests done to show how well: ? Your kidneys and liver work. ? Your blood clots. General instructions  Plan to have a responsible adult take you home from the hospital or clinic.  If you will be going home right after the procedure, plan to have a responsible adult care for you for the time you are told. This is important. What happens during the procedure?  You will be given  the sedative. The sedative may be given: ? As a pill that you will swallow. It can also be inserted into the rectum. ? As a spray through the nose. ? As an injection into the muscle. ? As an injection into the vein through an IV.  You may be given oxygen as needed.  Your breathing, heart rate, and blood pressure will be monitored during the procedure.  The medical or dental procedure will be done. The procedure may vary among health care providers and hospitals.   What happens after the procedure?  Your blood pressure, heart rate, breathing rate, and blood oxygen level will be monitored until you leave the hospital or clinic.  You will get fluids through your IV if needed.  Do not drive or operate machinery until your health care provider says that it is safe. Summary  Sedation is the use of medicines to promote relaxation and to relieve discomfort and anxiety. Moderate conscious sedation is a type of sedation that is used during short medical and dental procedures.  Tell the health care provider about any medical conditions that you have and about all the medicines that you are taking.  You will be given the sedative as a pill, a spray through the nose, an injection into the muscle, or an injection into the vein through an IV. Vital signs are monitored during the sedation.  Moderate conscious sedation allows you to return to your regular activities sooner. This information is not intended to replace advice given to you by your health care provider. Make sure you discuss any questions you have with your health care provider. Document Revised: 03/02/2020 Document Reviewed: 09/29/2019 Elsevier Patient Education  2021 Elsevier Inc.  

## 2021-02-14 NOTE — Progress Notes (Signed)
Discharge instructions reviewed with pt and his wife both voice understanding.

## 2021-02-15 LAB — SURGICAL PATHOLOGY

## 2021-02-18 ENCOUNTER — Ambulatory Visit: Admission: RE | Admit: 2021-02-18 | Payer: Medicare Other | Source: Ambulatory Visit | Admitting: Radiation Oncology

## 2021-02-18 ENCOUNTER — Ambulatory Visit
Admission: RE | Admit: 2021-02-18 | Discharge: 2021-02-18 | Disposition: A | Discharge status: Home / Self Care | Payer: Medicare Other | Source: Ambulatory Visit | Attending: Radiation Oncology | Admitting: Radiation Oncology

## 2021-02-18 VITALS — BP 138/84 | HR 64 | Temp 97.3°F | Resp 18 | Ht 74.0 in | Wt 275.0 lb

## 2021-02-18 DIAGNOSIS — R160 Hepatomegaly, not elsewhere classified: Secondary | ICD-10-CM | POA: Diagnosis not present

## 2021-02-18 DIAGNOSIS — C787 Secondary malignant neoplasm of liver and intrahepatic bile duct: Secondary | ICD-10-CM | POA: Diagnosis not present

## 2021-02-18 DIAGNOSIS — C186 Malignant neoplasm of descending colon: Secondary | ICD-10-CM | POA: Insufficient documentation

## 2021-02-22 ENCOUNTER — Other Ambulatory Visit: Payer: Self-pay

## 2021-02-22 NOTE — Progress Notes (Signed)
Schedule issues overcome. SIM plans to scan at 1330 today. This RN started a 22 gauge right AC IV on the first attempt. Site flushed without difficulty or pain. Saline locked IV and secured it in place. Removed IV at 1450 following completion of simulation. Catheter intact upon removal. Bandaid applied to old IV site. Instructed patient to remove bandaid once he was home. Patient verbalized understanding.   Has armband been applied?  Yes.    Does patient have an allergy to IV contrast dye?: No.   Has patient ever received premedication for IV contrast dye?: No.   Does patient take metformin?: No.  If patient does take metformin when was the last dose: n/a  Date of lab work: 02/14/21 BUN: 19 CR: 1.32  IV site: antecubital right, condition no redness, patent  Has IV site been added to flowsheet?  Yes.    BP 138/84   Pulse 64   Temp (!) 97.3 F (36.3 C)   Resp 18   Ht 6\' 2"  (1.88 m)   Wt 275 lb (124.7 kg)   SpO2 99%   BMI 35.31 kg/m

## 2021-03-04 ENCOUNTER — Ambulatory Visit: Payer: Medicare Other | Admitting: Radiation Oncology

## 2021-03-04 DIAGNOSIS — C186 Malignant neoplasm of descending colon: Secondary | ICD-10-CM | POA: Diagnosis not present

## 2021-03-04 DIAGNOSIS — R160 Hepatomegaly, not elsewhere classified: Secondary | ICD-10-CM | POA: Diagnosis not present

## 2021-03-04 DIAGNOSIS — C787 Secondary malignant neoplasm of liver and intrahepatic bile duct: Secondary | ICD-10-CM | POA: Diagnosis not present

## 2021-03-05 ENCOUNTER — Ambulatory Visit
Admission: RE | Admit: 2021-03-05 | Discharge: 2021-03-05 | Disposition: A | Discharge status: Home / Self Care | Payer: Medicare Other | Source: Ambulatory Visit | Attending: Radiation Oncology | Admitting: Radiation Oncology

## 2021-03-05 ENCOUNTER — Other Ambulatory Visit: Payer: Self-pay

## 2021-03-05 DIAGNOSIS — C186 Malignant neoplasm of descending colon: Secondary | ICD-10-CM | POA: Diagnosis not present

## 2021-03-05 DIAGNOSIS — R160 Hepatomegaly, not elsewhere classified: Secondary | ICD-10-CM | POA: Diagnosis not present

## 2021-03-06 ENCOUNTER — Ambulatory Visit: Payer: Medicare Other | Admitting: Radiation Oncology

## 2021-03-07 ENCOUNTER — Other Ambulatory Visit: Payer: Self-pay

## 2021-03-07 ENCOUNTER — Ambulatory Visit
Admission: RE | Admit: 2021-03-07 | Discharge: 2021-03-07 | Disposition: A | Discharge status: Home / Self Care | Payer: Medicare Other | Source: Ambulatory Visit | Attending: Radiation Oncology | Admitting: Radiation Oncology

## 2021-03-07 DIAGNOSIS — R160 Hepatomegaly, not elsewhere classified: Secondary | ICD-10-CM | POA: Diagnosis not present

## 2021-03-07 DIAGNOSIS — C186 Malignant neoplasm of descending colon: Secondary | ICD-10-CM | POA: Diagnosis not present

## 2021-03-08 ENCOUNTER — Ambulatory Visit: Payer: Medicare Other | Admitting: Radiation Oncology

## 2021-03-11 ENCOUNTER — Ambulatory Visit
Admission: RE | Admit: 2021-03-11 | Discharge: 2021-03-11 | Disposition: A | Discharge status: Home / Self Care | Payer: Medicare Other | Source: Ambulatory Visit | Attending: Radiation Oncology | Admitting: Radiation Oncology

## 2021-03-11 ENCOUNTER — Other Ambulatory Visit: Payer: Self-pay

## 2021-03-11 DIAGNOSIS — R160 Hepatomegaly, not elsewhere classified: Secondary | ICD-10-CM | POA: Diagnosis not present

## 2021-03-11 DIAGNOSIS — C186 Malignant neoplasm of descending colon: Secondary | ICD-10-CM | POA: Diagnosis not present

## 2021-03-13 ENCOUNTER — Other Ambulatory Visit: Payer: Self-pay

## 2021-03-13 ENCOUNTER — Ambulatory Visit
Admission: RE | Admit: 2021-03-13 | Discharge: 2021-03-13 | Disposition: A | Discharge status: Home / Self Care | Payer: Medicare Other | Source: Ambulatory Visit | Attending: Radiation Oncology | Admitting: Radiation Oncology

## 2021-03-13 DIAGNOSIS — C186 Malignant neoplasm of descending colon: Secondary | ICD-10-CM | POA: Diagnosis not present

## 2021-03-13 DIAGNOSIS — R160 Hepatomegaly, not elsewhere classified: Secondary | ICD-10-CM | POA: Diagnosis not present

## 2021-03-14 ENCOUNTER — Other Ambulatory Visit: Payer: Self-pay | Admitting: *Deleted

## 2021-03-14 DIAGNOSIS — C186 Malignant neoplasm of descending colon: Secondary | ICD-10-CM

## 2021-03-15 ENCOUNTER — Ambulatory Visit: Payer: Medicare Other | Admitting: Radiation Oncology

## 2021-03-18 ENCOUNTER — Other Ambulatory Visit: Payer: Self-pay

## 2021-03-18 ENCOUNTER — Ambulatory Visit
Admission: RE | Admit: 2021-03-18 | Discharge: 2021-03-18 | Disposition: A | Discharge status: Home / Self Care | Payer: Medicare Other | Source: Ambulatory Visit | Attending: Radiation Oncology | Admitting: Radiation Oncology

## 2021-03-18 ENCOUNTER — Encounter: Payer: Self-pay | Admitting: Radiation Oncology

## 2021-03-18 DIAGNOSIS — C787 Secondary malignant neoplasm of liver and intrahepatic bile duct: Secondary | ICD-10-CM | POA: Diagnosis not present

## 2021-03-18 DIAGNOSIS — C186 Malignant neoplasm of descending colon: Secondary | ICD-10-CM | POA: Diagnosis not present

## 2021-03-18 DIAGNOSIS — R160 Hepatomegaly, not elsewhere classified: Secondary | ICD-10-CM | POA: Insufficient documentation

## 2021-03-18 NOTE — Progress Notes (Addendum)
                                                                                                                                                             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  Intent: Curative  Radiation Treatment Dates: 03/05/2021 through 03/18/2021 Site Technique Total Dose (Gy) Dose per Fx (Gy) Completed Fx Beam Energies  Liver: Liver IMRT 60/60 12 5/5 10XFFF   Narrative: The patient tolerated radiation therapy relatively well. The patient did not complain of any specific side effects at the completion of his treatment.  Plan: The patient will receive a call in about one month from the radiation oncology department. He will continue follow up with Dr. Benay Spice as well.   ________________________________________________    Carola Rhine, [facility]

## 2021-03-19 ENCOUNTER — Inpatient Hospital Stay: Payer: Medicare Other | Admitting: Nurse Practitioner

## 2021-03-19 ENCOUNTER — Inpatient Hospital Stay: Payer: Medicare Other

## 2021-03-29 ENCOUNTER — Other Ambulatory Visit (HOSPITAL_BASED_OUTPATIENT_CLINIC_OR_DEPARTMENT_OTHER): Payer: Self-pay

## 2021-03-29 ENCOUNTER — Encounter: Payer: Self-pay | Admitting: Nurse Practitioner

## 2021-03-29 ENCOUNTER — Other Ambulatory Visit: Payer: Self-pay

## 2021-03-29 ENCOUNTER — Inpatient Hospital Stay: Payer: Medicare Other | Attending: Oncology

## 2021-03-29 ENCOUNTER — Inpatient Hospital Stay (HOSPITAL_BASED_OUTPATIENT_CLINIC_OR_DEPARTMENT_OTHER): Payer: Medicare Other | Admitting: Nurse Practitioner

## 2021-03-29 ENCOUNTER — Ambulatory Visit: Payer: Medicare Other | Attending: Internal Medicine

## 2021-03-29 VITALS — BP 168/96 | HR 66 | Temp 97.8°F | Resp 18 | Ht 74.0 in | Wt 271.4 lb

## 2021-03-29 DIAGNOSIS — D6959 Other secondary thrombocytopenia: Secondary | ICD-10-CM | POA: Insufficient documentation

## 2021-03-29 DIAGNOSIS — C787 Secondary malignant neoplasm of liver and intrahepatic bile duct: Secondary | ICD-10-CM | POA: Diagnosis not present

## 2021-03-29 DIAGNOSIS — C186 Malignant neoplasm of descending colon: Secondary | ICD-10-CM

## 2021-03-29 DIAGNOSIS — Z23 Encounter for immunization: Secondary | ICD-10-CM

## 2021-03-29 LAB — CEA (ACCESS): CEA (CHCC): 35.28 ng/mL — ABNORMAL HIGH (ref 0.00–5.00)

## 2021-03-29 MED ORDER — LORAZEPAM 0.5 MG PO TABS: 0.5000 mg | ORAL_TABLET | Freq: Two times a day (BID) | ORAL | 0 refills | Status: DC | PRN

## 2021-03-29 MED ORDER — COVID-19 MRNA VACC (MODERNA) 100 MCG/0.5ML IM SUSP
INTRAMUSCULAR | 0 refills | Status: DC
  Filled 2021-03-29: qty 0.3, 1d supply, fill #0

## 2021-03-29 NOTE — Progress Notes (Addendum)
[place] OFFICE PROGRESS NOTE   Diagnosis: Colon cancer  INTERVAL HISTORY:   Jeremy Stevenson returns as scheduled.  He completed the course of SBRT to the liver on 03/18/2021.  Overall he feels well.  Good appetite.  No pain.  Bowels moving regularly.  No nausea or vomiting.  Objective:  Vital signs in last 24 hours:  Blood pressure (!) 168/96, pulse 66, temperature 97.8 F (36.6 C), temperature source Oral, resp. rate 18, height _0  (1.88 m), weight 271 lb 6.4 oz (123.1 kg), SpO2 100 %.    HEENT: Neck without mass. Lymphatics: No palpable cervical, supraclavicular or axillary lymph nodes. Resp: Lungs clear bilaterally. Cardio: Regular rate and rhythm. GI: Abdomen soft and nontender.  No hepatomegaly. Vascular: No leg edema.    Lab Results:  Lab Results  Component Value Date   WBC 9.9 02/14/2021   HGB 12.5 (L) 02/14/2021   HCT 37.6 (L) 02/14/2021   MCV 89.1 02/14/2021   PLT 188 02/14/2021   NEUTROABS 7.1 02/14/2021    Imaging:  No results found.  Medications: I have reviewed the patient's current medications.  Assessment/Plan: 1. Adenocarcinoma of the descending colon, stage II (T3 N0), status post a left colectomy 01/20/2017 ? MSI-stable, no loss of mismatch repair protein expression ? Elevated preoperative CEA ? Incomplete preoperative colonoscopy ? Colonoscopy 08/07/2017-multiple polyps removed including tubular adenomas ? CT abdomen/pelvis 02/05/2018-no evidence of metastatic disease. ? Elevated CEA June 2019 ? CTs 06/29/2018-no evidence of metastatic disease, stable mild bilateral iliac adenopathy ? PET scan 6/96/2952-[id number] hypermetabolic central right liver metastasis;asymmetric hypermetabolism right palatine tonsil with associated mild asymmetric soft tissue fullness on CT images ? MRI liver 08/25/2018-2.6 cm mass in the central right liver consistent with metastasis, no other evidence of metastatic disease ? Ablation and biopsy of right  liver lesion 09/29/2018-pathology revealed adenocarcinoma; foundation 1-KRAS G12V, NRAS wildtype, microsatellite stable, tumor mutational burden 1 ? Cycle 1 FOLFOX 10/25/2018 ? Cycle 2 FOLFOX 11/09/2018 ? Cycle 3 FOLFOX 11/23/2018 ? Cycle 4 FOLFOX 12/07/2018 ? Cycle 5 FOLFOX 12/21/2018 (oxaliplatin dose reduced secondary to thrombocytopenia) ? Cycle 6 FOLFOX 01/04/2019 ? Cycle 7 FOLFOX 01/18/2019 ? Cycle 8 FOLFOX 02/01/2019 ? Cycle 9 FOLFOX 02/15/2019 ? MRI abdomen 02/24/2019-ablation site in the liver without residual tumor identified. Mild intrahepatic biliary dilatation distal to the ablation site. Diffuse hepatic steatosis. No new liver lesions identified. ? Cycle 10 FOLFOX 02/28/2019 (oxaliplatin held due to neuropathy and thrombocytopenia) ? Cycle 11 FOLFOX 03/14/2019 (oxaliplatin eliminated from the regimen due to neuropathy, 5-fluorouracil dose adjusted due to diarrhea and tearing) ? Cycle 12 FOLFOX 03/28/2019 (no oxaliplatin, 5-fluorouracil as per treatment 03/14/2019) ? CTs 09/08/2019-no evidence of recurrent disease, no evidence of recurrent disease at the hepatic ablation site, stable renal cysts ? CT abdomen/pelvis 03/06/2020-stable ablation site, no evidence of disease progression, stable gastric lipoma ? Colonoscopy 05/03/2020-polyp removed from the cecum-hyperplastic polyp ? Elevated CEA 09/06/2020 ? CTs 09/06/2020-no evidence of recurrent disease, stable ablation change in the right liver ? Further elevation of CEA 11/12/2020 ? PET 11/22/2020-small focus of hypermetabolism in the medial right liver at the previous ablation site, no other evidence of metastatic disease ? MRI liver 12/17/2020-interval contraction of the ablation site within the right hepatic lobe.  No new enhancing lesion to localize recurrence in the right hepatic lobe.  Subtle new duct dilatation in the right hepatic lobe which leads back to the same central region of increased metabolic activity on comparison FDG PET scan.   Combination of findings  concerning for recurrent disease in the right hepatic lobe at the previous ablation site. ? SBRT 03/05/2021, 03/07/2021, 03/11/2021, 03/13/2021, 03/18/2021  2. Enlargement of the right testicle-hydrocele-Testicular ultrasound 02/26/2017-negative for mass, small bilateral hydroceles  3. Hypertension  4. Depression  5. Family history of breast and ovarian cancer, negative genetic testing  6.History of left leg edema, pain.  7.Pigmented lesion right lower leg-refer to dermatology 06/30/2018  8. 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  He completed a course of SBRT to the liver 03/05/2021 through 03/18/2021. He seems to have tolerated well.  Plan for follow-up and repeat CEA in approximately 5 weeks.  Repeat imaging at a 65-monthinterval.    Patient seen with Dr. SBenay Spice    LNed CardANP/GNP-BC   03/29/2021  2:30 PM  This was a shared visit with LNed Card  Mr. JMartiniqueappears stable.  He completed SBRT to the central right liver lesion.  He appears to have tolerated the treatment well.  We will plan for a restaging CT evaluation in approximately 3 months.  We recommend he obtain a COVID-19 booster vaccine.  I was present for greater than 50% of today's visit.  I performed medical decision making.

## 2021-03-29 NOTE — Progress Notes (Signed)
   Covid-19 Vaccination Clinic  Name:  Jeremy Stevenson    MRN: [id number] DOB: 02-Feb-1951  03/29/2021  [name] was observed post Covid-19 immunization for 15 minutes without incident. He was provided with Vaccine Information Sheet and instruction to access the V-Safe system.   [name] was instructed to call 911 with any severe reactions post vaccine: Marland Kitchen Difficulty breathing  . Swelling of face and throat  . A fast heartbeat  . A bad rash all over body  . Dizziness and weakness   Immunizations Administered    Name Date Dose VIS Date Route   Moderna Covid-19 Booster Vaccine 03/29/2021  3:25 PM 0.25 mL 09/05/2020 Intramuscular   Manufacturer: Moderna   Lot: [id number]   [place]: [phone]

## 2021-04-01 LAB — CEA (IN HOUSE-CHCC): CEA (CHCC-In House): 59.7 ng/mL — ABNORMAL HIGH (ref 0.00–5.00)

## 2021-05-02 NOTE — Progress Notes (Signed)
  Radiation Oncology         [phone]) [phone] ________________________________  Name: Drakkar S Martinique MRN: [id number]  Date of Service: 05/06/2021  DOB: 07/09/1951  Post Treatment Telephone Note  Diagnosis:   Progressive Metastatic Stage IIA, cT3N0, adenocarcinoma of the descending colon with liver metastases.  Interval Since Last Radiation:  7 weeks   03/05/2021 through 03/18/2021 SBRT Site Technique Total Dose (Gy) Dose per Fx (Gy) Completed Fx Beam Energies  Liver: Liver IMRT 60/60 12 5/5 10XFFF    Narrative:  The patient was contacted today for routine follow-up. During treatment he did very well with radiotherapy and did not have significant desquamation.    Impression/Plan: 1. Progressive Metastatic Stage IIA, cT3N0, adenocarcinoma of the descending colon with liver metastases. I tried to reach the patient but he was unavailable and his voicemail box was not set up.  We would be happy to continue to follow him as needed, but he will also continue to follow up with Dr. Learta Codding in medical oncology.      Carola Rhine, PAC

## 2021-05-03 ENCOUNTER — Other Ambulatory Visit: Payer: Self-pay

## 2021-05-03 ENCOUNTER — Inpatient Hospital Stay: Payer: Medicare Other | Attending: Oncology | Admitting: Oncology

## 2021-05-03 ENCOUNTER — Inpatient Hospital Stay: Payer: Medicare Other

## 2021-05-03 VITALS — BP 154/92 | HR 66 | Temp 97.8°F | Resp 20 | Ht 74.0 in | Wt 275.6 lb

## 2021-05-03 DIAGNOSIS — Z79899 Other long term (current) drug therapy: Secondary | ICD-10-CM | POA: Insufficient documentation

## 2021-05-03 DIAGNOSIS — I10 Essential (primary) hypertension: Secondary | ICD-10-CM | POA: Diagnosis not present

## 2021-05-03 DIAGNOSIS — C186 Malignant neoplasm of descending colon: Secondary | ICD-10-CM | POA: Diagnosis not present

## 2021-05-03 DIAGNOSIS — Z9049 Acquired absence of other specified parts of digestive tract: Secondary | ICD-10-CM | POA: Insufficient documentation

## 2021-05-03 DIAGNOSIS — K760 Fatty (change of) liver, not elsewhere classified: Secondary | ICD-10-CM | POA: Diagnosis not present

## 2021-05-03 DIAGNOSIS — F32A Depression, unspecified: Secondary | ICD-10-CM | POA: Diagnosis not present

## 2021-05-03 DIAGNOSIS — D6959 Other secondary thrombocytopenia: Secondary | ICD-10-CM | POA: Insufficient documentation

## 2021-05-03 DIAGNOSIS — T451X5A Adverse effect of antineoplastic and immunosuppressive drugs, initial encounter: Secondary | ICD-10-CM | POA: Diagnosis not present

## 2021-05-03 DIAGNOSIS — G629 Polyneuropathy, unspecified: Secondary | ICD-10-CM | POA: Diagnosis not present

## 2021-05-03 DIAGNOSIS — C787 Secondary malignant neoplasm of liver and intrahepatic bile duct: Secondary | ICD-10-CM | POA: Diagnosis not present

## 2021-05-03 LAB — CEA (ACCESS): CEA (CHCC): 7.92 ng/mL — ABNORMAL HIGH (ref 0.00–5.00)

## 2021-05-03 NOTE — Progress Notes (Signed)
[place] OFFICE PROGRESS NOTE   Diagnosis: Colon cancer  INTERVAL HISTORY:   Jeremy Stevenson returns as scheduled.  He feels well.  Good appetite.  He continues to have neuropathy symptoms in the extremities.  No other complaint.  Objective:  Vital signs in last 24 hours:  Blood pressure (!) 154/92, pulse 66, temperature 97.8 F (36.6 C), temperature source Oral, resp. rate 20, height '6\' 2"'  (1.88 m), weight 275 lb 9.6 oz (125 kg), SpO2 99 %.    Lymphatics: No cervical, supraclavicular, axillary, or inguinal nodes Resp: Lungs clear bilaterally Cardio: Regular rate and rhythm, distant heart sounds GI: Nontender, no mass, no hepatosplenomegaly Vascular: No edema, chronic stasis change at the lower leg bilaterally  Lab Results:  Lab Results  Component Value Date   WBC 9.9 02/14/2021   HGB 12.5 (L) 02/14/2021   HCT 37.6 (L) 02/14/2021   MCV 89.1 02/14/2021   PLT 188 02/14/2021   NEUTROABS 7.1 02/14/2021    CMP  Lab Results  Component Value Date   NA 134 (L) 02/14/2021   K 3.5 02/14/2021   CL 97 (L) 02/14/2021   CO2 26 02/14/2021   GLUCOSE 181 (H) 02/14/2021   BUN 19 02/14/2021   CREATININE 1.32 (H) 02/14/2021   CALCIUM 9.3 02/14/2021   PROT 7.0 02/14/2021   ALBUMIN 3.4 (L) 02/14/2021   AST 32 02/14/2021   ALT 29 02/14/2021   ALKPHOS 96 02/14/2021   BILITOT 1.0 02/14/2021   GFRNONAA 59 (L) 02/14/2021   GFRAA >60 03/07/2020    Lab Results  Component Value Date   CEA1 59.70 (H) 03/29/2021     Medications: I have reviewed the patient's current medications.   Assessment/Plan: Adenocarcinoma of the descending colon, stage II (T3 N0), status post a left colectomy 01/20/2017 MSI-stable, no loss of mismatch repair protein expression Elevated preoperative CEA Incomplete preoperative colonoscopy Colonoscopy 08/07/2017-multiple polyps removed including tubular adenomas CT abdomen/pelvis 02/05/2018-no evidence of metastatic disease. Elevated CEA  June 2019 CTs 06/29/2018-no evidence of metastatic disease, stable mild bilateral iliac adenopathy PET scan 08/03/2018- solitary hypermetabolic central right liver metastasis; asymmetric hypermetabolism right palatine tonsil with associated mild asymmetric soft tissue fullness on CT images MRI liver 08/25/2018- 2.6 cm mass in the central right liver consistent with metastasis, no other evidence of metastatic disease Ablation and biopsy of right liver lesion 09/29/2018-pathology revealed adenocarcinoma; foundation 1-KRAS G12V, NRAS wildtype, microsatellite stable, tumor mutational burden 1 Cycle 1 FOLFOX 10/25/2018 Cycle 2 FOLFOX 11/09/2018 Cycle 3 FOLFOX 11/23/2018 Cycle 4 FOLFOX 12/07/2018 Cycle 5 FOLFOX 12/21/2018 (oxaliplatin dose reduced secondary to thrombocytopenia) Cycle 6 FOLFOX 01/04/2019 Cycle 7 FOLFOX 01/18/2019 Cycle 8 FOLFOX 02/01/2019 Cycle 9 FOLFOX 02/15/2019 MRI abdomen 02/24/2019- ablation site in the liver without residual tumor identified.  Mild intrahepatic biliary dilatation distal to the ablation site.  Diffuse hepatic steatosis.  No new liver lesions identified. Cycle 10 FOLFOX 02/28/2019 (oxaliplatin held due to neuropathy and thrombocytopenia) Cycle 11 FOLFOX 03/14/2019 (oxaliplatin eliminated from the regimen due to neuropathy, 5-fluorouracil dose adjusted due to diarrhea and tearing) Cycle 12 FOLFOX 03/28/2019 (no oxaliplatin, 5-fluorouracil as per treatment 03/14/2019) CTs 09/08/2019-no evidence of recurrent disease, no evidence of recurrent disease at the hepatic ablation site, stable renal cysts CT abdomen/pelvis 03/06/2020-stable ablation site, no evidence of disease progression, stable gastric lipoma Colonoscopy 05/03/2020-polyp removed from the cecum-hyperplastic polyp Elevated CEA 09/06/2020 CTs 09/06/2020-no evidence of recurrent disease, stable ablation change in the right liver Further elevation of CEA 11/12/2020 PET 11/22/2020- small focus of hypermetabolism  in the medial right  liver at the previous ablation site, no other evidence of metastatic disease MRI liver 12/17/2020-interval contraction of the ablation site within the right hepatic lobe.  No new enhancing lesion to localize recurrence in the right hepatic lobe.  Subtle new duct dilatation in the right hepatic lobe which leads back to the same central region of increased metabolic activity on comparison FDG PET scan.  Combination of findings concerning for recurrent disease in the right hepatic lobe at the previous ablation site. SBRT 03/05/2021, 03/07/2021, 03/11/2021, 03/13/2021, 03/18/2021   2.  Enlargement of the right testicle-hydrocele-Testicular ultrasound 02/26/2017-negative for mass, small bilateral hydroceles   3.  Hypertension   4.  Depression   5.  Family history of breast and ovarian cancer, negative genetic testing   6.  History of left leg edema, pain.   7.  Pigmented lesion right lower leg-refer to dermatology 06/30/2018   8.  Port-A-Cath placement 10/13/2018   9.  Thrombocytopenia secondary to chemotherapy   10.  Oxaliplatin neuropathy-trial of gabapentin 12/30/2019    Disposition: Jeremy Stevenson appears stable.  We will follow-up on the CEA from today.  He will be scheduled for a restaging MRI of the liver and office visit during the week of 06/24/2021.  Betsy Coder, MD  05/03/2021  12:10 PM

## 2021-05-06 ENCOUNTER — Other Ambulatory Visit: Payer: Self-pay

## 2021-05-06 ENCOUNTER — Telehealth: Payer: Self-pay

## 2021-05-06 ENCOUNTER — Ambulatory Visit
Admission: RE | Admit: 2021-05-06 | Discharge: 2021-05-06 | Disposition: A | Discharge status: Home / Self Care | Payer: Medicare Other | Source: Ambulatory Visit | Attending: Radiation Oncology | Admitting: Radiation Oncology

## 2021-05-06 DIAGNOSIS — C186 Malignant neoplasm of descending colon: Secondary | ICD-10-CM

## 2021-05-06 NOTE — Telephone Encounter (Signed)
-----   Message from Ladell Pier, MD sent at 05/03/2021  4:10 PM EDT ----- Please call patient, CEA is better, follow-up as scheduled

## 2021-05-06 NOTE — Telephone Encounter (Signed)
Called spoke with wife who will relay to husband/pt most recent CEA results and provider will follow up as scheduled

## 2021-05-07 LAB — CEA (IN HOUSE-CHCC): CEA (CHCC-In House): 21.92 ng/mL — ABNORMAL HIGH (ref 0.00–5.00)

## 2021-05-13 DIAGNOSIS — G4730 Sleep apnea, unspecified: Secondary | ICD-10-CM | POA: Diagnosis not present

## 2021-05-13 DIAGNOSIS — Z299 Encounter for prophylactic measures, unspecified: Secondary | ICD-10-CM | POA: Diagnosis not present

## 2021-05-13 DIAGNOSIS — Z6835 Body mass index (BMI) 35.0-35.9, adult: Secondary | ICD-10-CM | POA: Diagnosis not present

## 2021-05-13 DIAGNOSIS — I10 Essential (primary) hypertension: Secondary | ICD-10-CM | POA: Diagnosis not present

## 2021-05-13 DIAGNOSIS — Z87891 Personal history of nicotine dependence: Secondary | ICD-10-CM | POA: Diagnosis not present

## 2021-05-13 DIAGNOSIS — B029 Zoster without complications: Secondary | ICD-10-CM | POA: Diagnosis not present

## 2021-05-16 DIAGNOSIS — R55 Syncope and collapse: Secondary | ICD-10-CM | POA: Diagnosis not present

## 2021-05-16 DIAGNOSIS — I10 Essential (primary) hypertension: Secondary | ICD-10-CM | POA: Diagnosis not present

## 2021-05-31 ENCOUNTER — Other Ambulatory Visit: Payer: Self-pay

## 2021-05-31 ENCOUNTER — Ambulatory Visit
Admission: RE | Admit: 2021-05-31 | Discharge: 2021-05-31 | Disposition: A | Discharge status: Home / Self Care | Payer: Medicare Other | Source: Ambulatory Visit | Attending: Oncology | Admitting: Oncology

## 2021-05-31 ENCOUNTER — Encounter: Payer: Self-pay | Admitting: Oncology

## 2021-05-31 DIAGNOSIS — M47816 Spondylosis without myelopathy or radiculopathy, lumbar region: Secondary | ICD-10-CM | POA: Diagnosis not present

## 2021-05-31 DIAGNOSIS — K5730 Diverticulosis of large intestine without perforation or abscess without bleeding: Secondary | ICD-10-CM | POA: Diagnosis not present

## 2021-05-31 DIAGNOSIS — K862 Cyst of pancreas: Secondary | ICD-10-CM | POA: Diagnosis not present

## 2021-05-31 DIAGNOSIS — C189 Malignant neoplasm of colon, unspecified: Secondary | ICD-10-CM | POA: Diagnosis not present

## 2021-05-31 DIAGNOSIS — C186 Malignant neoplasm of descending colon: Secondary | ICD-10-CM

## 2021-05-31 MED ORDER — GADOBENATE DIMEGLUMINE 529 MG/ML IV SOLN
20.0000 mL | Freq: Once | INTRAVENOUS | Status: AC | PRN
  Administered 2021-05-31: 20 mL via INTRAVENOUS

## 2021-06-26 ENCOUNTER — Other Ambulatory Visit: Payer: Self-pay

## 2021-06-26 ENCOUNTER — Inpatient Hospital Stay: Payer: Medicare Other | Attending: Oncology

## 2021-06-26 DIAGNOSIS — C787 Secondary malignant neoplasm of liver and intrahepatic bile duct: Secondary | ICD-10-CM | POA: Diagnosis not present

## 2021-06-26 DIAGNOSIS — C186 Malignant neoplasm of descending colon: Secondary | ICD-10-CM | POA: Insufficient documentation

## 2021-06-26 DIAGNOSIS — D6959 Other secondary thrombocytopenia: Secondary | ICD-10-CM | POA: Insufficient documentation

## 2021-06-26 LAB — CEA (ACCESS): CEA (CHCC): 5.33 ng/mL — ABNORMAL HIGH (ref 0.00–5.00)

## 2021-06-28 ENCOUNTER — Encounter: Payer: Self-pay | Admitting: Nurse Practitioner

## 2021-06-28 ENCOUNTER — Other Ambulatory Visit: Payer: Self-pay

## 2021-06-28 ENCOUNTER — Inpatient Hospital Stay (HOSPITAL_BASED_OUTPATIENT_CLINIC_OR_DEPARTMENT_OTHER): Payer: Medicare Other | Admitting: Nurse Practitioner

## 2021-06-28 VITALS — BP 154/97 | HR 68 | Temp 98.2°F | Resp 20 | Ht 74.0 in | Wt 277.6 lb

## 2021-06-28 DIAGNOSIS — D6959 Other secondary thrombocytopenia: Secondary | ICD-10-CM | POA: Diagnosis not present

## 2021-06-28 DIAGNOSIS — C186 Malignant neoplasm of descending colon: Secondary | ICD-10-CM | POA: Diagnosis not present

## 2021-06-28 DIAGNOSIS — C787 Secondary malignant neoplasm of liver and intrahepatic bile duct: Secondary | ICD-10-CM | POA: Diagnosis not present

## 2021-06-28 NOTE — Progress Notes (Signed)
[place] OFFICE PROGRESS NOTE   Diagnosis: Colon cancer  INTERVAL HISTORY:   Jeremy Stevenson returns as scheduled.  He has a good appetite.  He reports persistent numbness in the hands and feet, occasional associated pain.  No nausea or vomiting.  Bowels moving.  Objective:  Vital signs in last 24 hours:  Blood pressure (!) 154/97, pulse 68, temperature 98.2 F (36.8 C), resp. rate 20, height '6\' 2"'  (1.88 m), weight 277 lb 9.6 oz (125.9 kg), SpO2 97 %.    HEENT: Neck without mass. Lymphatics: No palpable cervical, supraclavicular, axillary or inguinal lymph nodes. Resp: Lungs clear bilaterally. Cardio: Regular rate and rhythm. GI: Abdomen soft and nontender.  No hepatomegaly. Vascular: No leg edema.  Chronic stasis change lower leg bilaterally.   Lab Results:  Lab Results  Component Value Date   WBC 9.9 02/14/2021   HGB 12.5 (L) 02/14/2021   HCT 37.6 (L) 02/14/2021   MCV 89.1 02/14/2021   PLT 188 02/14/2021   NEUTROABS 7.1 02/14/2021    Imaging:  No results found.  Medications: I have reviewed the patient's current medications.  Assessment/Plan: Adenocarcinoma of the descending colon, stage II (T3 N0), status post a left colectomy 01/20/2017 MSI-stable, no loss of mismatch repair protein expression Elevated preoperative CEA Incomplete preoperative colonoscopy Colonoscopy 08/07/2017-multiple polyps removed including tubular adenomas CT abdomen/pelvis 02/05/2018-no evidence of metastatic disease. Elevated CEA June 2019 CTs 06/29/2018-no evidence of metastatic disease, stable mild bilateral iliac adenopathy PET scan 08/03/2018- solitary hypermetabolic central right liver metastasis; asymmetric hypermetabolism right palatine tonsil with associated mild asymmetric soft tissue fullness on CT images MRI liver 08/25/2018- 2.6 cm mass in the central right liver consistent with metastasis, no other evidence of metastatic disease Ablation and biopsy of right liver  lesion 09/29/2018-pathology revealed adenocarcinoma; foundation 1-KRAS G12V, NRAS wildtype, microsatellite stable, tumor mutational burden 1 Cycle 1 FOLFOX 10/25/2018 Cycle 2 FOLFOX 11/09/2018 Cycle 3 FOLFOX 11/23/2018 Cycle 4 FOLFOX 12/07/2018 Cycle 5 FOLFOX 12/21/2018 (oxaliplatin dose reduced secondary to thrombocytopenia) Cycle 6 FOLFOX 01/04/2019 Cycle 7 FOLFOX 01/18/2019 Cycle 8 FOLFOX 02/01/2019 Cycle 9 FOLFOX 02/15/2019 MRI abdomen 02/24/2019- ablation site in the liver without residual tumor identified.  Mild intrahepatic biliary dilatation distal to the ablation site.  Diffuse hepatic steatosis.  No new liver lesions identified. Cycle 10 FOLFOX 02/28/2019 (oxaliplatin held due to neuropathy and thrombocytopenia) Cycle 11 FOLFOX 03/14/2019 (oxaliplatin eliminated from the regimen due to neuropathy, 5-fluorouracil dose adjusted due to diarrhea and tearing) Cycle 12 FOLFOX 03/28/2019 (no oxaliplatin, 5-fluorouracil as per treatment 03/14/2019) CTs 09/08/2019-no evidence of recurrent disease, no evidence of recurrent disease at the hepatic ablation site, stable renal cysts CT abdomen/pelvis 03/06/2020-stable ablation site, no evidence of disease progression, stable gastric lipoma Colonoscopy 05/03/2020-polyp removed from the cecum-hyperplastic polyp Elevated CEA 09/06/2020 CTs 09/06/2020-no evidence of recurrent disease, stable ablation change in the right liver Further elevation of CEA 11/12/2020 PET 11/22/2020- small focus of hypermetabolism in the medial right liver at the previous ablation site, no other evidence of metastatic disease MRI liver 12/17/2020-interval contraction of the ablation site within the right hepatic lobe.  No new enhancing lesion to localize recurrence in the right hepatic lobe.  Subtle new duct dilatation in the right hepatic lobe which leads back to the same central region of increased metabolic activity on comparison FDG PET scan.  Combination of findings concerning for recurrent  disease in the right hepatic lobe at the previous ablation site. Biopsy right liver mass near the site of prior ablation 02/14/2021-liver  parenchyma with nonspecific changes and fragments of necrotic tissue SBRT 03/05/2021, 03/07/2021, 03/11/2021, 03/13/2021, 03/18/2021 MRI liver 06/03/2021-similar appearance of a 2.1 x 1.2 cm oval-shaped hypoenhancing lesion medially in the posterior right hepatic lobe associated with downstream biliary dilatation; this is the site of prior microwave ablation.  No definite abnormal enhancement.  No significant new liver lesions identified.  0.7 cm cystic lesion in the pancreatic head, possibly a postinflammatory lesion or small intraductal papillary mucinous neoplasm.   2.  Enlargement of the right testicle-hydrocele-Testicular ultrasound 02/26/2017-negative for mass, small bilateral hydroceles   3.  Hypertension   4.  Depression   5.  Family history of breast and ovarian cancer, negative genetic testing   6.  History of left leg edema, pain.   7.  Pigmented lesion right lower leg-refer to dermatology 06/30/2018   8.  Port-A-Cath placement 10/13/2018   9.  Thrombocytopenia secondary to chemotherapy   10.  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  Recent restaging MRI of the liver shows stable change at the site of prior microwave ablation, no new liver lesions.  0.7 cm cystic lesion in the pancreatic head noted.  Dr. Benay Spice reviewed the results with Jeremy Stevenson at today's visit.  Plan for next MRI at a 81-monthinterval if CEA remains stable to improved.  Mr. JMartiniqueagrees with this plan.  He will return for a CEA and follow-up visit in 2 months.  We are available to see him sooner if needed.  Patient seen with Dr. SBenay Spice  LNed CardANP/GNP-BC   06/28/2021  11:12 AM  This was a shared visit with LNed Card  We reviewed the restaging MRI results with Jeremy Stevenson  There is no radiologic  evidence of disease progression.  The CEA is lower.  The plan is to continue observation.  He will return for an office visit and CEA in 2 months.  I was present for greater than 50% of today's visit.  I performed medical decision making.  BJulieanne Manson MD

## 2021-07-17 DIAGNOSIS — R55 Syncope and collapse: Secondary | ICD-10-CM | POA: Diagnosis not present

## 2021-07-17 DIAGNOSIS — I10 Essential (primary) hypertension: Secondary | ICD-10-CM | POA: Diagnosis not present

## 2021-08-28 ENCOUNTER — Other Ambulatory Visit: Payer: Self-pay

## 2021-08-28 ENCOUNTER — Inpatient Hospital Stay (HOSPITAL_BASED_OUTPATIENT_CLINIC_OR_DEPARTMENT_OTHER): Payer: Medicare Other | Admitting: Oncology

## 2021-08-28 ENCOUNTER — Inpatient Hospital Stay: Payer: Medicare Other | Attending: Oncology

## 2021-08-28 ENCOUNTER — Telehealth: Payer: Self-pay | Admitting: Oncology

## 2021-08-28 VITALS — BP 128/72 | HR 69 | Temp 98.4°F | Resp 18 | Ht 74.0 in | Wt 271.8 lb

## 2021-08-28 DIAGNOSIS — C186 Malignant neoplasm of descending colon: Secondary | ICD-10-CM | POA: Diagnosis not present

## 2021-08-28 DIAGNOSIS — C787 Secondary malignant neoplasm of liver and intrahepatic bile duct: Secondary | ICD-10-CM | POA: Insufficient documentation

## 2021-08-28 DIAGNOSIS — D6959 Other secondary thrombocytopenia: Secondary | ICD-10-CM | POA: Diagnosis not present

## 2021-08-28 LAB — CEA (ACCESS): CEA (CHCC): 3.79 ng/mL (ref 0.00–5.00)

## 2021-08-28 NOTE — Progress Notes (Signed)
[facility] OFFICE PROGRESS NOTE   Diagnosis: Colon cancer  INTERVAL HISTORY:   Mr. Scatena returns as scheduled.  He generally feels well.  He reports intentional weight loss with a change of his diet.  He continues to have numbness in the extremities.  He tripped over a scale at the Cancer center today.  He reports minor injuries to the top of his head and a hand.  Objective:  Vital signs in last 24 hours:  Blood pressure 128/72, pulse 69, temperature 98.4 F (36.9 C), temperature source Oral, resp. rate 18, height 6' 2" (1.88 m), weight 271 lb 12.8 oz (123.3 kg), SpO2 100 %.    HEENT: Scalp without laceration or hematoma Lymphatics: No cervical, supraclavicular, axillary, or inguinal nodes Resp: Lungs clear bilaterally Cardio: Regular rate and rhythm GI: No mass, nontender, no hepatosplenomegaly Vascular: No leg edema Neuro: Alert and oriented   Lab Results:  Lab Results  Component Value Date   WBC 9.9 02/14/2021   HGB 12.5 (L) 02/14/2021   HCT 37.6 (L) 02/14/2021   MCV 89.1 02/14/2021   PLT 188 02/14/2021   NEUTROABS 7.1 02/14/2021    CMP  Lab Results  Component Value Date   NA 134 (L) 02/14/2021   K 3.5 02/14/2021   CL 97 (L) 02/14/2021   CO2 26 02/14/2021   GLUCOSE 181 (H) 02/14/2021   BUN 19 02/14/2021   CREATININE 1.32 (H) 02/14/2021   CALCIUM 9.3 02/14/2021   PROT 7.0 02/14/2021   ALBUMIN 3.4 (L) 02/14/2021   AST 32 02/14/2021   ALT 29 02/14/2021   ALKPHOS 96 02/14/2021   BILITOT 1.0 02/14/2021   GFRNONAA 59 (L) 02/14/2021   GFRAA >60 03/07/2020    Lab Results  Component Value Date   CEA1 21.92 (H) 05/03/2021   CEA 3.79 08/28/2021     Medications: I have reviewed the patient's current medications.   Assessment/Plan: Adenocarcinoma of the descending colon, stage II (T3 N0), status post a left colectomy 01/20/2017 MSI-stable, no loss of mismatch repair protein expression Elevated preoperative CEA Incomplete preoperative  colonoscopy Colonoscopy 08/07/2017-multiple polyps removed including tubular adenomas CT abdomen/pelvis 02/05/2018-no evidence of metastatic disease. Elevated CEA June 2019 CTs 06/29/2018-no evidence of metastatic disease, stable mild bilateral iliac adenopathy PET scan 08/03/2018- solitary hypermetabolic central right liver metastasis; asymmetric hypermetabolism right palatine tonsil with associated mild asymmetric soft tissue fullness on CT images MRI liver 08/25/2018- 2.6 cm mass in the central right liver consistent with metastasis, no other evidence of metastatic disease Ablation and biopsy of right liver lesion 09/29/2018-pathology revealed adenocarcinoma; foundation 1-KRAS G12V, NRAS wildtype, microsatellite stable, tumor mutational burden 1 Cycle 1 FOLFOX 10/25/2018 Cycle 2 FOLFOX 11/09/2018 Cycle 3 FOLFOX 11/23/2018 Cycle 4 FOLFOX 12/07/2018 Cycle 5 FOLFOX 12/21/2018 (oxaliplatin dose reduced secondary to thrombocytopenia) Cycle 6 FOLFOX 01/04/2019 Cycle 7 FOLFOX 01/18/2019 Cycle 8 FOLFOX 02/01/2019 Cycle 9 FOLFOX 02/15/2019 MRI abdomen 02/24/2019- ablation site in the liver without residual tumor identified.  Mild intrahepatic biliary dilatation distal to the ablation site.  Diffuse hepatic steatosis.  No new liver lesions identified. Cycle 10 FOLFOX 02/28/2019 (oxaliplatin held due to neuropathy and thrombocytopenia) Cycle 11 FOLFOX 03/14/2019 (oxaliplatin eliminated from the regimen due to neuropathy, 5-fluorouracil dose adjusted due to diarrhea and tearing) Cycle 12 FOLFOX 03/28/2019 (no oxaliplatin, 5-fluorouracil as per treatment 03/14/2019) CTs 09/08/2019-no evidence of recurrent disease, no evidence of recurrent disease at the hepatic ablation site, stable renal cysts CT abdomen/pelvis 03/06/2020-stable ablation site, no evidence of disease progression, stable gastric   lipoma Colonoscopy 05/03/2020-polyp removed from the cecum-hyperplastic polyp Elevated CEA 09/06/2020 CTs 09/06/2020-no evidence of  recurrent disease, stable ablation change in the right liver Further elevation of CEA 11/12/2020 PET 11/22/2020- small focus of hypermetabolism in the medial right liver at the previous ablation site, no other evidence of metastatic disease MRI liver 12/17/2020-interval contraction of the ablation site within the right hepatic lobe.  No new enhancing lesion to localize recurrence in the right hepatic lobe.  Subtle new duct dilatation in the right hepatic lobe which leads back to the same central region of increased metabolic activity on comparison FDG PET scan.  Combination of findings concerning for recurrent disease in the right hepatic lobe at the previous ablation site. Biopsy right liver mass near the site of prior ablation 02/14/2021-liver parenchyma with nonspecific changes and fragments of necrotic tissue SBRT 03/05/2021, 03/07/2021, 03/11/2021, 03/13/2021, 03/18/2021 MRI liver 06/03/2021-similar appearance of a 2.1 x 1.2 cm oval-shaped hypoenhancing lesion medially in the posterior right hepatic lobe associated with downstream biliary dilatation; this is the site of prior microwave ablation.  No definite abnormal enhancement.  No significant new liver lesions identified.  0.7 cm cystic lesion in the pancreatic head, possibly a postinflammatory lesion or small intraductal papillary mucinous neoplasm.   2.  Enlargement of the right testicle-hydrocele-Testicular ultrasound 02/26/2017-negative for mass, small bilateral hydroceles   3.  Hypertension   4.  Depression   5.  Family history of breast and ovarian cancer, negative genetic testing   6.  History of left leg edema, pain.   7.  Pigmented lesion right lower leg-refer to dermatology 06/30/2018   8.  Port-A-Cath placement 10/13/2018   9.  Thrombocytopenia secondary to chemotherapy   10.  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  The CEA is normal.  He is in clinical remission from colon  cancer.  He will return for an office visit and CEA in 2 months.  He will be scheduled for a repeat MRI of the liver in January.  He does not appear to have experienced a significant injury from the fall today.  Betsy Coder, MD  08/28/2021  11:58 AM

## 2021-08-28 NOTE — Progress Notes (Signed)
   08/28/21 1112  What Happened  Was fall witnessed? Yes  Who witnessed fall? Angie Hooker  Patients activity before fall ambulating-assisted  Point of contact buttocks;head (right side of head)  Was patient injured? No  Follow Up  MD notified Dr. Benay Spice  Time MD notified [phone]  Family notified  (No)  Additional tests No  Adult Fall Risk Assessment  Risk Factor Category (scoring not indicated) History of more than one fall within 6 months before admission (document High fall risk) (patient stated to tech that he falls "all the time")  Age 70  Fall History: Fall within 6 months prior to admission 5 (patient stated to tech that he falls "all the time")  Elimination; Bowel and/or Urine Incontinence 0  Elimination; Bowel and/or Urine Urgency/Frequency 0  Medications: includes PCA/Opiates, Anti-convulsants, Anti-hypertensives, Diuretics, Hypnotics, Laxatives, Sedatives, and Psychotropics 5  Patient Care Equipment 0  Mobility-Assistance 0  Mobility-Gait 0 (steady walking into CC and stepping onto scale; fell stepping off of scale)  Mobility-Sensory Deficit 0  Altered awareness of immediate physical environment 0  Impulsiveness 0  Lack of understanding of one's physical/cognitive limitations 0  Total Score 11  Patient Fall Risk Level High fall risk  Adult Fall Risk Interventions  Required Bundle Interventions *See Row Information* High fall risk - low, moderate, and high requirements implemented  Vitals  Temp 98.4 F (36.9 C)  Temp Source Oral  BP 128/72  BP Location Left Arm  Pulse Rate 69  Resp 18  Oxygen Therapy  SpO2 100 %  Pain Assessment  Pain Scale 0-10  Pain Score 0  Neurological  Neuro (WDL) WDL  Musculoskeletal  Musculoskeletal (WDL) WDL  Integumentary  Integumentary (WDL) WDL

## 2021-08-28 NOTE — Progress Notes (Signed)
Patient was ambulating independently with no issues into clinic beside of nurse tech. Patient got on scale by himself. As patient turned to get off of upright scale, patient lost his balance and went down on buttocks. He braced his fall with his right palm of hand. On his way to the floor, he hit his right side of head on the wheelchair-capable scale handlebar that sits beside the upright scale. Patient ended up on his buttocks on the floor of the wheelchair scale. Patient said "I am fine" and began getting up on his own. Nurse tech Janace Hoard and Lenox Ponds LPN helped pt to the chair beside the scale. Vital signs obtained and stable. Pain 0. Lenox Ponds LPN and Angie nurse tech walked with pt to an exam room. Lenox Ponds looked over patient's skin from head to toe in exam room and found that skin was WNL. Dr. Benay Spice also went in to see patient for his regularly scheduled exam appt and assessed patient. Patient discharged ambulatory and in stable condition from clinic after exam visit.

## 2021-08-29 ENCOUNTER — Telehealth: Payer: Self-pay | Admitting: Emergency Medicine

## 2021-08-29 NOTE — Telephone Encounter (Signed)
Post Fall Callback Pt was not at home. Spoke with his spouse, Jeremy Stevenson, she stated that he seems to be ok. Having no complaints.  Mrs. Stevenson did express her concern regarding how the patient stated that he fell and hopes that more caution is used in the future

## 2021-09-16 DIAGNOSIS — I10 Essential (primary) hypertension: Secondary | ICD-10-CM | POA: Diagnosis not present

## 2021-09-16 DIAGNOSIS — R55 Syncope and collapse: Secondary | ICD-10-CM | POA: Diagnosis not present

## 2021-09-25 DIAGNOSIS — R739 Hyperglycemia, unspecified: Secondary | ICD-10-CM | POA: Diagnosis not present

## 2021-09-25 DIAGNOSIS — Z23 Encounter for immunization: Secondary | ICD-10-CM | POA: Diagnosis not present

## 2021-09-25 DIAGNOSIS — Z6834 Body mass index (BMI) 34.0-34.9, adult: Secondary | ICD-10-CM | POA: Diagnosis not present

## 2021-09-25 DIAGNOSIS — N400 Enlarged prostate without lower urinary tract symptoms: Secondary | ICD-10-CM | POA: Diagnosis not present

## 2021-09-25 DIAGNOSIS — I10 Essential (primary) hypertension: Secondary | ICD-10-CM | POA: Diagnosis not present

## 2021-09-25 DIAGNOSIS — Z299 Encounter for prophylactic measures, unspecified: Secondary | ICD-10-CM | POA: Diagnosis not present

## 2021-09-25 DIAGNOSIS — F419 Anxiety disorder, unspecified: Secondary | ICD-10-CM | POA: Diagnosis not present

## 2021-10-18 DIAGNOSIS — Z6835 Body mass index (BMI) 35.0-35.9, adult: Secondary | ICD-10-CM | POA: Diagnosis not present

## 2021-10-18 DIAGNOSIS — Z1331 Encounter for screening for depression: Secondary | ICD-10-CM | POA: Diagnosis not present

## 2021-10-18 DIAGNOSIS — Z79899 Other long term (current) drug therapy: Secondary | ICD-10-CM | POA: Diagnosis not present

## 2021-10-18 DIAGNOSIS — Z1339 Encounter for screening examination for other mental health and behavioral disorders: Secondary | ICD-10-CM | POA: Diagnosis not present

## 2021-10-18 DIAGNOSIS — R5383 Other fatigue: Secondary | ICD-10-CM | POA: Diagnosis not present

## 2021-10-18 DIAGNOSIS — Z7189 Other specified counseling: Secondary | ICD-10-CM | POA: Diagnosis not present

## 2021-10-18 DIAGNOSIS — Z0000 Encounter for general adult medical examination without abnormal findings: Secondary | ICD-10-CM | POA: Diagnosis not present

## 2021-10-18 DIAGNOSIS — Z299 Encounter for prophylactic measures, unspecified: Secondary | ICD-10-CM | POA: Diagnosis not present

## 2021-10-18 DIAGNOSIS — E7800 Pure hypercholesterolemia, unspecified: Secondary | ICD-10-CM | POA: Diagnosis not present

## 2021-10-22 DIAGNOSIS — E7800 Pure hypercholesterolemia, unspecified: Secondary | ICD-10-CM | POA: Diagnosis not present

## 2021-10-22 DIAGNOSIS — Z125 Encounter for screening for malignant neoplasm of prostate: Secondary | ICD-10-CM | POA: Diagnosis not present

## 2021-10-22 DIAGNOSIS — Z79899 Other long term (current) drug therapy: Secondary | ICD-10-CM | POA: Diagnosis not present

## 2021-10-22 DIAGNOSIS — R5383 Other fatigue: Secondary | ICD-10-CM | POA: Diagnosis not present

## 2021-10-30 ENCOUNTER — Telehealth: Payer: Self-pay | Admitting: *Deleted

## 2021-10-30 ENCOUNTER — Inpatient Hospital Stay (HOSPITAL_BASED_OUTPATIENT_CLINIC_OR_DEPARTMENT_OTHER): Payer: Medicare Other | Admitting: Nurse Practitioner

## 2021-10-30 ENCOUNTER — Encounter: Payer: Self-pay | Admitting: Nurse Practitioner

## 2021-10-30 ENCOUNTER — Other Ambulatory Visit: Payer: Self-pay

## 2021-10-30 ENCOUNTER — Inpatient Hospital Stay: Payer: Medicare Other | Attending: Oncology

## 2021-10-30 VITALS — BP 156/87 | HR 66 | Temp 97.8°F | Resp 18 | Ht 74.0 in | Wt 272.4 lb

## 2021-10-30 DIAGNOSIS — Z85038 Personal history of other malignant neoplasm of large intestine: Secondary | ICD-10-CM | POA: Diagnosis not present

## 2021-10-30 DIAGNOSIS — C186 Malignant neoplasm of descending colon: Secondary | ICD-10-CM

## 2021-10-30 DIAGNOSIS — Z9221 Personal history of antineoplastic chemotherapy: Secondary | ICD-10-CM | POA: Insufficient documentation

## 2021-10-30 LAB — CEA (ACCESS): CEA (CHCC): 5.79 ng/mL — ABNORMAL HIGH (ref 0.00–5.00)

## 2021-10-30 NOTE — Telephone Encounter (Signed)
Called patient with MRI appointment on 12/28 at [facility]. Arrival at 0730. NPO 4 hours prior.

## 2021-10-30 NOTE — Progress Notes (Signed)
°Jeremy Stevenson °OFFICE PROGRESS NOTE ° ° °Diagnosis: Colon cancer ° °INTERVAL HISTORY:  ° °Jeremy Stevenson returns as scheduled.  He denies abdominal pain.  Appetite described as "fine".  He has stable numbness in the hands and feet.  He reports his wife is sick with upper respiratory symptoms.  He currently has a "stopped up head", cough with phlegm.  No shortness of breath or fever.  He has not COVID tested. ° °Objective: ° °Vital signs in last 24 hours: ° °Blood pressure (!) 156/87, pulse 66, temperature 97.8 °F (36.6 °C), temperature source Oral, resp. rate 18, height 6' 2" (1.88 m), weight 272 lb 6.4 oz (123.6 kg), SpO2 100 %. °  ° °Lymphatics: No palpable cervical, supraclavicular, axillary or inguinal lymph nodes. °Resp: Lungs clear bilaterally. °Cardio: Regular rate and rhythm. °GI: Abdomen soft and nontender.  No hepatomegaly. °Vascular: No leg edema.  Chronic stasis change lower leg bilaterally. ° ° °Lab Results: ° °Lab Results  °Component Value Date  ° WBC 9.9 02/14/2021  ° HGB 12.5 (L) 02/14/2021  ° HCT 37.6 (L) 02/14/2021  ° MCV 89.1 02/14/2021  ° PLT 188 02/14/2021  ° NEUTROABS 7.1 02/14/2021  ° ° °Imaging: ° °No results found. ° °Medications: I have reviewed the patient's current medications. ° °Assessment/Plan: °Adenocarcinoma of the descending colon, stage II (T3 N0), status post a left colectomy 01/20/2017 °MSI-stable, no loss of mismatch repair protein expression °Elevated preoperative CEA °Incomplete preoperative colonoscopy °Colonoscopy 08/07/2017-multiple polyps removed including tubular adenomas °CT abdomen/pelvis 02/05/2018-no evidence of metastatic disease. °Elevated CEA June 2019 °CTs 06/29/2018-no evidence of metastatic disease, stable mild bilateral iliac adenopathy °PET scan 08/03/2018- solitary hypermetabolic central right liver metastasis; asymmetric hypermetabolism right palatine tonsil with associated mild asymmetric soft tissue fullness on CT images °MRI liver 08/25/2018- 2.6 cm  mass in the central right liver consistent with metastasis, no other evidence of metastatic disease °Ablation and biopsy of right liver lesion 09/29/2018-pathology revealed adenocarcinoma; foundation 1-KRAS G12V, NRAS wildtype, microsatellite stable, tumor mutational burden 1 °Cycle 1 FOLFOX 10/25/2018 °Cycle 2 FOLFOX 11/09/2018 °Cycle 3 FOLFOX 11/23/2018 °Cycle 4 FOLFOX 12/07/2018 °Cycle 5 FOLFOX 12/21/2018 (oxaliplatin dose reduced secondary to thrombocytopenia) °Cycle 6 FOLFOX 01/04/2019 °Cycle 7 FOLFOX 01/18/2019 °Cycle 8 FOLFOX 02/01/2019 °Cycle 9 FOLFOX 02/15/2019 °MRI abdomen 02/24/2019- ablation site in the liver without residual tumor identified.  Mild intrahepatic biliary dilatation distal to the ablation site.  Diffuse hepatic steatosis.  No new liver lesions identified. °Cycle 10 FOLFOX 02/28/2019 (oxaliplatin held due to neuropathy and thrombocytopenia) °Cycle 11 FOLFOX 03/14/2019 (oxaliplatin eliminated from the regimen due to neuropathy, 5-fluorouracil dose adjusted due to diarrhea and tearing) °Cycle 12 FOLFOX 03/28/2019 (no oxaliplatin, 5-fluorouracil as per treatment 03/14/2019) °CTs 09/08/2019-no evidence of recurrent disease, no evidence of recurrent disease at the hepatic ablation site, stable renal cysts °CT abdomen/pelvis 03/06/2020-stable ablation site, no evidence of disease progression, stable gastric lipoma °Colonoscopy 05/03/2020-polyp removed from the cecum-hyperplastic polyp °Elevated CEA 09/06/2020 °CTs 09/06/2020-no evidence of recurrent disease, stable ablation change in the right liver °Further elevation of CEA 11/12/2020 °PET 11/22/2020- small focus of hypermetabolism in the medial right liver at the previous ablation site, no other evidence of metastatic disease °MRI liver 12/17/2020-interval contraction of the ablation site within the right hepatic lobe.  No new enhancing lesion to localize recurrence in the right hepatic lobe.  Subtle new duct dilatation in the right hepatic lobe which leads back to  the same central region of increased metabolic activity on comparison FDG PET scan.  Combination   Combination of findings concerning for recurrent disease in the right hepatic lobe at the previous ablation site. Biopsy right liver mass near the site of prior ablation 02/14/2021-liver parenchyma with nonspecific changes and fragments of necrotic tissue SBRT 03/05/2021, 03/07/2021, 03/11/2021, 03/13/2021, 03/18/2021 MRI liver 06/03/2021-similar appearance of a 2.1 x 1.2 cm oval-shaped hypoenhancing lesion medially in the posterior right hepatic lobe associated with downstream biliary dilatation; this is the site of prior microwave ablation.  No definite abnormal enhancement.  No significant new liver lesions identified.  0.7 cm cystic lesion in the pancreatic head, possibly a postinflammatory lesion or small intraductal papillary mucinous neoplasm.   2.  Enlargement of the right testicle-hydrocele-Testicular ultrasound 02/26/2017-negative for mass, small bilateral hydroceles   3.  Hypertension   4.  Depression   5.  Family history of breast and ovarian cancer, negative genetic testing   6.  History of left leg edema, pain.   7.  Pigmented lesion right lower leg-refer to dermatology 06/30/2018   8.  Port-A-Cath placement 10/13/2018   9.  Thrombocytopenia secondary to chemotherapy   10.  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 appears stable.  He remains in clinical remission from colon cancer.  We will follow-up on the CEA from today.  Plan for repeat MRI of the liver in the next few weeks.  He will return for a CEA and follow-up visit in 2 months.  We are available to see him sooner if needed.  I encouraged him to complete a COVID test given current symptoms he and his wife are experiencing.    Jeremy Stevenson ANP/GNP-BC   10/30/2021  10:55 AM

## 2021-11-04 DIAGNOSIS — Z87891 Personal history of nicotine dependence: Secondary | ICD-10-CM | POA: Diagnosis not present

## 2021-11-04 DIAGNOSIS — Z299 Encounter for prophylactic measures, unspecified: Secondary | ICD-10-CM | POA: Diagnosis not present

## 2021-11-04 DIAGNOSIS — J209 Acute bronchitis, unspecified: Secondary | ICD-10-CM | POA: Diagnosis not present

## 2021-11-04 DIAGNOSIS — Z713 Dietary counseling and surveillance: Secondary | ICD-10-CM | POA: Diagnosis not present

## 2021-11-04 DIAGNOSIS — Z6835 Body mass index (BMI) 35.0-35.9, adult: Secondary | ICD-10-CM | POA: Diagnosis not present

## 2021-11-13 ENCOUNTER — Ambulatory Visit (HOSPITAL_COMMUNITY): Admission: RE | Admit: 2021-11-13 | Payer: Medicare Other | Source: Ambulatory Visit

## 2021-11-15 DIAGNOSIS — M109 Gout, unspecified: Secondary | ICD-10-CM | POA: Diagnosis not present

## 2021-11-16 ENCOUNTER — Ambulatory Visit (HOSPITAL_COMMUNITY): Payer: Medicare Other

## 2021-12-17 DIAGNOSIS — M109 Gout, unspecified: Secondary | ICD-10-CM | POA: Diagnosis not present

## 2021-12-31 ENCOUNTER — Other Ambulatory Visit: Payer: Self-pay

## 2021-12-31 ENCOUNTER — Inpatient Hospital Stay: Payer: Medicare Other | Attending: Oncology

## 2021-12-31 ENCOUNTER — Inpatient Hospital Stay (HOSPITAL_BASED_OUTPATIENT_CLINIC_OR_DEPARTMENT_OTHER): Payer: Medicare Other | Admitting: Oncology

## 2021-12-31 VITALS — BP 155/80 | HR 92 | Temp 98.1°F | Resp 20 | Ht 74.0 in | Wt 271.8 lb

## 2021-12-31 DIAGNOSIS — C186 Malignant neoplasm of descending colon: Secondary | ICD-10-CM | POA: Insufficient documentation

## 2021-12-31 DIAGNOSIS — C787 Secondary malignant neoplasm of liver and intrahepatic bile duct: Secondary | ICD-10-CM | POA: Diagnosis not present

## 2021-12-31 DIAGNOSIS — D6959 Other secondary thrombocytopenia: Secondary | ICD-10-CM | POA: Insufficient documentation

## 2021-12-31 LAB — CEA (ACCESS): CEA (CHCC): 10.45 ng/mL — ABNORMAL HIGH (ref 0.00–5.00)

## 2021-12-31 NOTE — Progress Notes (Signed)
[place] OFFICE PROGRESS NOTE   Diagnosis: Colon cancer  INTERVAL HISTORY:   [name] returns as scheduled.  No new complaint.  Good appetite.  No pain.  Objective:  Vital signs in last 24 hours:  Blood pressure (!) 155/80, pulse 92, temperature 98.1 F (36.7 C), temperature source Oral, resp. rate 20, height '6\' 2"'  (1.88 m), weight 271 lb 12.8 oz (123.3 kg), SpO2 99 %.    Lymphatics: No cervical or supraclavicular nodes Resp: Lungs clear bilaterally Cardio: Regular rate and rhythm GI: No hepatosplenomegaly, nontender, no mass Vascular: No leg edema  Lab Results:  Lab Results  Component Value Date   WBC 9.9 02/14/2021   HGB 12.5 (L) 02/14/2021   HCT 37.6 (L) 02/14/2021   MCV 89.1 02/14/2021   PLT 188 02/14/2021   NEUTROABS 7.1 02/14/2021    CMP  Lab Results  Component Value Date   NA 134 (L) 02/14/2021   K 3.5 02/14/2021   CL 97 (L) 02/14/2021   CO2 26 02/14/2021   GLUCOSE 181 (H) 02/14/2021   BUN 19 02/14/2021   CREATININE 1.32 (H) 02/14/2021   CALCIUM 9.3 02/14/2021   PROT 7.0 02/14/2021   ALBUMIN 3.4 (L) 02/14/2021   AST 32 02/14/2021   ALT 29 02/14/2021   ALKPHOS 96 02/14/2021   BILITOT 1.0 02/14/2021   GFRNONAA 59 (L) 02/14/2021   GFRAA >60 03/07/2020    Lab Results  Component Value Date   CEA1 21.92 (H) 05/03/2021   CEA 10.45 (H) 12/31/2021    Medications: I have reviewed the patient's current medications.   Assessment/Plan: Adenocarcinoma of the descending colon, stage II (T3 N0), status post a left colectomy 01/20/2017 MSI-stable, no loss of mismatch repair protein expression Elevated preoperative CEA Incomplete preoperative colonoscopy Colonoscopy 08/07/2017-multiple polyps removed including tubular adenomas CT abdomen/pelvis 02/05/2018-no evidence of metastatic disease. Elevated CEA June 2019 CTs 06/29/2018-no evidence of metastatic disease, stable mild bilateral iliac adenopathy PET scan 08/03/2018- solitary  hypermetabolic central right liver metastasis; asymmetric hypermetabolism right palatine tonsil with associated mild asymmetric soft tissue fullness on CT images MRI liver 08/25/2018- 2.6 cm mass in the central right liver consistent with metastasis, no other evidence of metastatic disease Ablation and biopsy of right liver lesion 09/29/2018-pathology revealed adenocarcinoma; foundation 1-KRAS G12V, NRAS wildtype, microsatellite stable, tumor mutational burden 1 Cycle 1 FOLFOX 10/25/2018 Cycle 2 FOLFOX 11/09/2018 Cycle 3 FOLFOX 11/23/2018 Cycle 4 FOLFOX 12/07/2018 Cycle 5 FOLFOX 12/21/2018 (oxaliplatin dose reduced secondary to thrombocytopenia) Cycle 6 FOLFOX 01/04/2019 Cycle 7 FOLFOX 01/18/2019 Cycle 8 FOLFOX 02/01/2019 Cycle 9 FOLFOX 02/15/2019 MRI abdomen 02/24/2019- ablation site in the liver without residual tumor identified.  Mild intrahepatic biliary dilatation distal to the ablation site.  Diffuse hepatic steatosis.  No new liver lesions identified. Cycle 10 FOLFOX 02/28/2019 (oxaliplatin held due to neuropathy and thrombocytopenia) Cycle 11 FOLFOX 03/14/2019 (oxaliplatin eliminated from the regimen due to neuropathy, 5-fluorouracil dose adjusted due to diarrhea and tearing) Cycle 12 FOLFOX 03/28/2019 (no oxaliplatin, 5-fluorouracil as per treatment 03/14/2019) CTs 09/08/2019-no evidence of recurrent disease, no evidence of recurrent disease at the hepatic ablation site, stable renal cysts CT abdomen/pelvis 03/06/2020-stable ablation site, no evidence of disease progression, stable gastric lipoma Colonoscopy 05/03/2020-polyp removed from the cecum-hyperplastic polyp Elevated CEA 09/06/2020 CTs 09/06/2020-no evidence of recurrent disease, stable ablation change in the right liver Further elevation of CEA 11/12/2020 PET 11/22/2020- small focus of hypermetabolism in the medial right liver at the previous ablation site, no other evidence of metastatic disease MRI liver  12/17/2020-interval contraction of the  ablation site within the right hepatic lobe.  No new enhancing lesion to localize recurrence in the right hepatic lobe.  Subtle new duct dilatation in the right hepatic lobe which leads back to the same central region of increased metabolic activity on comparison FDG PET scan.  Combination of findings concerning for recurrent disease in the right hepatic lobe at the previous ablation site. Biopsy right liver mass near the site of prior ablation 02/14/2021-liver parenchyma with nonspecific changes and fragments of necrotic tissue SBRT 03/05/2021, 03/07/2021, 03/11/2021, 03/13/2021, 03/18/2021 MRI liver 06/03/2021-similar appearance of a 2.1 x 1.2 cm oval-shaped hypoenhancing lesion medially in the posterior right hepatic lobe associated with downstream biliary dilatation; this is the site of prior microwave ablation.  No definite abnormal enhancement.  No significant new liver lesions identified.  0.7 cm cystic lesion in the pancreatic head, possibly a postinflammatory lesion or small intraductal papillary mucinous neoplasm.   2.  Enlargement of the right testicle-hydrocele-Testicular ultrasound 02/26/2017-negative for mass, small bilateral hydroceles   3.  Hypertension   4.  Depression   5.  Family history of breast and ovarian cancer, negative genetic testing   6.  History of left leg edema, pain.   7.  Pigmented lesion right lower leg-refer to dermatology 06/30/2018   8.  Port-A-Cath placement 10/13/2018   9.  Thrombocytopenia secondary to chemotherapy   10.  Oxaliplatin neuropathy-trial of gabapentin 12/30/2019      Disposition: History Jeremy Stevenson appears unchanged.  The CEA was higher in December and again today.  We discussed the possibility of progressive colon cancer.  He is scheduled for a restaging liver MRI on 01/14/2022.  He will return for an office visit on 01/15/2022.  Betsy Coder, MD  12/31/2021  11:47 AM

## 2022-01-14 ENCOUNTER — Ambulatory Visit (HOSPITAL_COMMUNITY)
Admission: RE | Admit: 2022-01-14 | Discharge: 2022-01-14 | Disposition: A | Discharge status: Home / Self Care | Payer: Medicare Other | Source: Ambulatory Visit | Attending: Nurse Practitioner | Admitting: Nurse Practitioner

## 2022-01-14 ENCOUNTER — Other Ambulatory Visit: Payer: Self-pay

## 2022-01-14 DIAGNOSIS — C189 Malignant neoplasm of colon, unspecified: Secondary | ICD-10-CM | POA: Diagnosis not present

## 2022-01-14 DIAGNOSIS — K862 Cyst of pancreas: Secondary | ICD-10-CM | POA: Diagnosis not present

## 2022-01-14 DIAGNOSIS — C186 Malignant neoplasm of descending colon: Secondary | ICD-10-CM | POA: Insufficient documentation

## 2022-01-14 DIAGNOSIS — C787 Secondary malignant neoplasm of liver and intrahepatic bile duct: Secondary | ICD-10-CM | POA: Diagnosis not present

## 2022-01-14 DIAGNOSIS — K760 Fatty (change of) liver, not elsewhere classified: Secondary | ICD-10-CM | POA: Diagnosis not present

## 2022-01-14 MED ORDER — GADOBUTROL 1 MMOL/ML IV SOLN
10.0000 mL | Freq: Once | INTRAVENOUS | Status: AC | PRN
  Administered 2022-01-14: 10 mL via INTRAVENOUS

## 2022-01-15 ENCOUNTER — Telehealth: Payer: Self-pay

## 2022-01-15 ENCOUNTER — Inpatient Hospital Stay: Payer: Medicare Other | Attending: Oncology | Admitting: Nurse Practitioner

## 2022-01-15 ENCOUNTER — Inpatient Hospital Stay: Payer: Medicare Other

## 2022-01-15 ENCOUNTER — Encounter: Payer: Self-pay | Admitting: Nurse Practitioner

## 2022-01-15 VITALS — BP 144/80 | HR 71 | Temp 98.0°F | Resp 18 | Ht 74.0 in | Wt 275.8 lb

## 2022-01-15 DIAGNOSIS — C186 Malignant neoplasm of descending colon: Secondary | ICD-10-CM | POA: Diagnosis not present

## 2022-01-15 DIAGNOSIS — C787 Secondary malignant neoplasm of liver and intrahepatic bile duct: Secondary | ICD-10-CM | POA: Insufficient documentation

## 2022-01-15 LAB — CEA (ACCESS): CEA (CHCC): 5.48 ng/mL — ABNORMAL HIGH (ref 0.00–5.00)

## 2022-01-15 NOTE — Telephone Encounter (Signed)
-----   Message from Owens Shark, NP sent at 01/15/2022  3:30 PM EST ----- ?I tried to call him.  There was no answer and no voicemail.  Please let him know the CEA is better, 5.48.  We will hold off on the PET scan for now.  Follow-up as scheduled in 1 month. ? ?

## 2022-01-15 NOTE — Progress Notes (Signed)
[facility] OFFICE PROGRESS NOTE   Diagnosis: Colon cancer  INTERVAL HISTORY:   Jeremy Stevenson returns as scheduled.  He feels well.  He has a good appetite.  No pain.  Continued numbness in the hands and feet.  Objective:  Vital signs in last 24 hours:  Blood pressure (!) 144/80, pulse 71, temperature 98 F (36.7 C), temperature source Oral, resp. rate 18, height '6\' 2"'  (1.88 m), weight 275 lb 12.8 oz (125.1 kg), SpO2 98 %.    Lymphatics: No palpable cervical, supraclavicular, axillary or inguinal lymph nodes. Resp: Lungs clear bilaterally. Cardio: Regular rate and rhythm. GI: Abdomen soft and nontender.  No hepatosplenomegaly. Vascular: No leg edema.  Lab Results:  Lab Results  Component Value Date   WBC 9.9 02/14/2021   HGB 12.5 (L) 02/14/2021   HCT 37.6 (L) 02/14/2021   MCV 89.1 02/14/2021   PLT 188 02/14/2021   NEUTROABS 7.1 02/14/2021    Imaging:  MR LIVER W WO CONTRAST  Result Date: 01/15/2022 CLINICAL DATA:  Colon cancer, assess treatment response of liver metastases, status post radiation therapy and microwave ablation of liver lesions. EXAM: MRI ABDOMEN WITHOUT AND WITH CONTRAST TECHNIQUE: Multiplanar multisequence MR imaging of the abdomen was performed both before and after the administration of intravenous contrast. CONTRAST:  28m GADAVIST GADOBUTROL 1 MMOL/ML IV SOLN COMPARISON:  05/31/2021 FINDINGS: Lower chest: No acute findings. Hepatobiliary: Hepatic steatosis. Unchanged hypoenhancing lesion of the posterior right lobe of the liver, hepatic segment VI, measuring 2.1 x 1.3 cm. There is segmental biliary ductal dilatation posteriorly and inferiorly to this lesion with hyperemia of the liver parenchyma (series 22, image 43, 53). No new liver lesions. Layering sludge in the gallbladder. No discrete no biliary ductal dilatation. Pancreas: Unchanged 0.7 cm fluid signal cystic lesion of the ventral pancreatic uncinate (series 5, image 27). No solid mass,  inflammatory changes, or other parenchymal abnormality identified.No pancreatic ductal dilatation. Spleen:  Splenomegaly, maximum coronal span 16.1 cm. Adrenals/Urinary Tract: Normal adrenal glands. Small, benign simple fluid signal and hemorrhagic or proteinaceous renal cysts. No renal masses or suspicious contrast enhancement identified. No evidence of hydronephrosis. Stomach/Bowel: Visualized portions within the abdomen are unremarkable. Vascular/Lymphatic: No pathologically enlarged lymph nodes identified. No abdominal aortic aneurysm demonstrated. Other:  None. Musculoskeletal: No suspicious osseous lesions identified. IMPRESSION: 1. Unchanged hypoenhancing lesion of the posterior right lobe of the liver, hepatic segment VI, measuring 2.1 x 1.3 cm. There is segmental biliary ductal dilatation posteriorly and inferiorly to this lesion with hyperemia of the liver parenchyma. Findings are consistent with ablated liver metastasis without MR evidence of residual disease. 2. No new liver lesions. 3. Hepatic steatosis. 4. Splenomegaly. 5. Unchanged 0.7 cm fluid signal cystic lesion of the ventral pancreatic uncinate. This is consistent with a small IPMN or pseudocyst. As there is no observed increased risk of malignancy for such lesions smaller than 2 cm, no specific further follow-up or characterization is required. Electronically Signed   By: ADelanna AhmadiM.D.   On: 01/15/2022 08:42    Medications: I have reviewed the patient's current medications.  Assessment/Plan: Adenocarcinoma of the descending colon, stage II (T3 N0), status post a left colectomy 01/20/2017 MSI-stable, no loss of mismatch repair protein expression Elevated preoperative CEA Incomplete preoperative colonoscopy Colonoscopy 08/07/2017-multiple polyps removed including tubular adenomas CT abdomen/pelvis 02/05/2018-no evidence of metastatic disease. Elevated CEA June 2019 CTs 06/29/2018-no evidence of metastatic disease, stable mild  bilateral iliac adenopathy PET scan 08/03/2018- solitary hypermetabolic central right liver metastasis; asymmetric  hypermetabolism right palatine tonsil with associated mild asymmetric soft tissue fullness on CT images MRI liver 08/25/2018- 2.6 cm mass in the central right liver consistent with metastasis, no other evidence of metastatic disease Ablation and biopsy of right liver lesion 09/29/2018-pathology revealed adenocarcinoma; foundation 1-KRAS G12V, NRAS wildtype, microsatellite stable, tumor mutational burden 1 Cycle 1 FOLFOX 10/25/2018 Cycle 2 FOLFOX 11/09/2018 Cycle 3 FOLFOX 11/23/2018 Cycle 4 FOLFOX 12/07/2018 Cycle 5 FOLFOX 12/21/2018 (oxaliplatin dose reduced secondary to thrombocytopenia) Cycle 6 FOLFOX 01/04/2019 Cycle 7 FOLFOX 01/18/2019 Cycle 8 FOLFOX 02/01/2019 Cycle 9 FOLFOX 02/15/2019 MRI abdomen 02/24/2019- ablation site in the liver without residual tumor identified.  Mild intrahepatic biliary dilatation distal to the ablation site.  Diffuse hepatic steatosis.  No new liver lesions identified. Cycle 10 FOLFOX 02/28/2019 (oxaliplatin held due to neuropathy and thrombocytopenia) Cycle 11 FOLFOX 03/14/2019 (oxaliplatin eliminated from the regimen due to neuropathy, 5-fluorouracil dose adjusted due to diarrhea and tearing) Cycle 12 FOLFOX 03/28/2019 (no oxaliplatin, 5-fluorouracil as per treatment 03/14/2019) CTs 09/08/2019-no evidence of recurrent disease, no evidence of recurrent disease at the hepatic ablation site, stable renal cysts CT abdomen/pelvis 03/06/2020-stable ablation site, no evidence of disease progression, stable gastric lipoma Colonoscopy 05/03/2020-polyp removed from the cecum-hyperplastic polyp Elevated CEA 09/06/2020 CTs 09/06/2020-no evidence of recurrent disease, stable ablation change in the right liver Further elevation of CEA 11/12/2020 PET 11/22/2020- small focus of hypermetabolism in the medial right liver at the previous ablation site, no other evidence of metastatic  disease MRI liver 12/17/2020-interval contraction of the ablation site within the right hepatic lobe.  No new enhancing lesion to localize recurrence in the right hepatic lobe.  Subtle new duct dilatation in the right hepatic lobe which leads back to the same central region of increased metabolic activity on comparison FDG PET scan.  Combination of findings concerning for recurrent disease in the right hepatic lobe at the previous ablation site. Biopsy right liver mass near the site of prior ablation 02/14/2021-liver parenchyma with nonspecific changes and fragments of necrotic tissue SBRT 03/05/2021, 03/07/2021, 03/11/2021, 03/13/2021, 03/18/2021 MRI liver 06/03/2021-similar appearance of a 2.1 x 1.2 cm oval-shaped hypoenhancing lesion medially in the posterior right hepatic lobe associated with downstream biliary dilatation; this is the site of prior microwave ablation.  No definite abnormal enhancement.  No significant new liver lesions identified.  0.7 cm cystic lesion in the pancreatic head, possibly a postinflammatory lesion or small intraductal papillary mucinous neoplasm. MRI liver 01/14/2022-unchanged hypoenhancing lesion of the posterior right lobe of the liver measuring 2.0 x 1.3 cm, segmental biliary ductal dilatation posteriorly and inferiorly to the lesion with hyperemia of the liver parenchyma.  Findings consistent with ablated liver metastasis without MR evidence of residual disease.  No new liver lesions.  Hepatic steatosis.  Splenomegaly.  Unchanged 0.7 cm fluid signal cystic lesion of the ventral pancreatic uncinate..   2.  Enlargement of the right testicle-hydrocele-Testicular ultrasound 02/26/2017-negative for mass, small bilateral hydroceles   3.  Hypertension   4.  Depression   5.  Family history of breast and ovarian cancer, negative genetic testing   6.  History of left leg edema, pain.   7.  Pigmented lesion right lower leg-refer to dermatology 06/30/2018   8.  Port-A-Cath placement  10/13/2018   9.  Thrombocytopenia secondary to chemotherapy   10.  Oxaliplatin neuropathy-trial of gabapentin 12/30/2019      Disposition: Jeremy Stevenson appears stable.  The recent restaging liver MRI does not show evidence of progression.  We are obtaining a repeat  CEA today.  If the CEA is higher the plan is for a PET scan.  Follow-up appointment in 1 month.  Sooner if needed.  Patient seen with Dr. Benay Spice.    [name] ANP/GNP-BC   01/15/2022  1:46 PM This was a shared visit with [name].  We discussed the MRI findings with Jeremy Stevenson.  There is no evidence of disease progression in the liver.  We will obtain a repeat CEA and if higher he will be referred for a restaging PET scan I was present for greater than 50% of today's visit.  I performed medical decision making.  Jeremy Manson, MD

## 2022-01-15 NOTE — Telephone Encounter (Signed)
Called and spoke with the patient to let him know his CEA is better 5.48 and we will hold off the PET scan for now. Follow up as scheduled. Patient voiced understanding of instruction and had no further questions or concerns at this time. ?

## 2022-01-16 ENCOUNTER — Ambulatory Visit: Payer: Medicare Other | Admitting: Nurse Practitioner

## 2022-01-17 DIAGNOSIS — I10 Essential (primary) hypertension: Secondary | ICD-10-CM | POA: Diagnosis not present

## 2022-01-17 DIAGNOSIS — C787 Secondary malignant neoplasm of liver and intrahepatic bile duct: Secondary | ICD-10-CM | POA: Diagnosis not present

## 2022-01-17 DIAGNOSIS — N400 Enlarged prostate without lower urinary tract symptoms: Secondary | ICD-10-CM | POA: Diagnosis not present

## 2022-01-17 DIAGNOSIS — Z6835 Body mass index (BMI) 35.0-35.9, adult: Secondary | ICD-10-CM | POA: Diagnosis not present

## 2022-01-17 DIAGNOSIS — C189 Malignant neoplasm of colon, unspecified: Secondary | ICD-10-CM | POA: Diagnosis not present

## 2022-01-17 DIAGNOSIS — Z299 Encounter for prophylactic measures, unspecified: Secondary | ICD-10-CM | POA: Diagnosis not present

## 2022-01-17 DIAGNOSIS — G4730 Sleep apnea, unspecified: Secondary | ICD-10-CM | POA: Diagnosis not present

## 2022-01-17 DIAGNOSIS — Z87891 Personal history of nicotine dependence: Secondary | ICD-10-CM | POA: Diagnosis not present

## 2022-01-20 DIAGNOSIS — R010 Benign and innocent cardiac murmurs: Secondary | ICD-10-CM | POA: Diagnosis not present

## 2022-02-13 DIAGNOSIS — Z961 Presence of intraocular lens: Secondary | ICD-10-CM | POA: Diagnosis not present

## 2022-02-13 DIAGNOSIS — H16223 Keratoconjunctivitis sicca, not specified as Sjogren's, bilateral: Secondary | ICD-10-CM | POA: Diagnosis not present

## 2022-02-13 DIAGNOSIS — H26493 Other secondary cataract, bilateral: Secondary | ICD-10-CM | POA: Diagnosis not present

## 2022-02-14 ENCOUNTER — Encounter: Payer: Self-pay | Admitting: Urology

## 2022-02-14 ENCOUNTER — Ambulatory Visit (INDEPENDENT_AMBULATORY_CARE_PROVIDER_SITE_OTHER): Payer: Medicare Other | Admitting: Urology

## 2022-02-14 VITALS — BP 178/94 | HR 66

## 2022-02-14 DIAGNOSIS — R351 Nocturia: Secondary | ICD-10-CM | POA: Diagnosis not present

## 2022-02-14 DIAGNOSIS — R972 Elevated prostate specific antigen [PSA]: Secondary | ICD-10-CM | POA: Diagnosis not present

## 2022-02-14 DIAGNOSIS — N138 Other obstructive and reflux uropathy: Secondary | ICD-10-CM

## 2022-02-14 DIAGNOSIS — R350 Frequency of micturition: Secondary | ICD-10-CM | POA: Insufficient documentation

## 2022-02-14 DIAGNOSIS — N401 Enlarged prostate with lower urinary tract symptoms: Secondary | ICD-10-CM | POA: Diagnosis not present

## 2022-02-14 LAB — URINALYSIS, ROUTINE W REFLEX MICROSCOPIC
Bilirubin, UA: NEGATIVE
Ketones, UA: NEGATIVE
Leukocytes,UA: NEGATIVE
Nitrite, UA: NEGATIVE
RBC, UA: NEGATIVE
Specific Gravity, UA: 1.02 (ref 1.005–1.030)
Urobilinogen, Ur: 0.2 mg/dL (ref 0.2–1.0)
pH, UA: 7 (ref 5.0–7.5)

## 2022-02-14 LAB — BLADDER SCAN AMB NON-IMAGING: Scan Result: 7

## 2022-02-14 MED ORDER — MIRABEGRON ER 25 MG PO TB24: 25.0000 mg | ORAL_TABLET | Freq: Every day | ORAL | 0 refills | Status: DC

## 2022-02-14 NOTE — Progress Notes (Signed)
? ?Assessment: ?1. Urinary frequency   ?2. Elevated PSA   ?3. Nocturia   ? ? ? ?Plan: ?I reviewed the patient's records from his PCP. ?Free and total PSA today ?Trial of Myrbetriq 25 mg daily.  Samples given.  Use and side effects discussed. ?Return to office in 1 month. ? ?Chief Complaint:  ?Chief Complaint  ?Patient presents with  ? Benign Prostatic Hypertrophy  ? Elevated PSA  ? ? ?History of Present Illness: ? ?Jeremy Stevenson is a 71 y.o. year old male who is seen in consultation from Monico Blitz, MD for evaluation of lower urinary tract symptoms.  He reports a 10-year history of lower urinary tract symptoms with worsening in the past several years.  He has urinary frequency, urgency, and nocturia, voiding 2 times per hour during the night.  He also has urge incontinence.  He reports an intermittent stream at times.  No dysuria or gross hematuria.  No recent UTIs.  He was previously treated with alfuzosin but did not see an improvement in his symptoms.  He is not currently taking any medication for his urinary symptoms. ?IPSS = 21 ? ?PSA from 10/22/2021: 4.6 ?No known history of elevated PSA.  No prior results available for review. ? ?He has a history of colon cancer and is status post colectomy.  He has received chemotherapy and radiation therapy for liver metastasis.  He is currently followed by oncology in [place]. ? ? ?Past Medical History:  ?Past Medical History:  ?Diagnosis Date  ? Colon cancer ([place])   ? Depression   ? Genetic testing 03/16/2017  ? [name] underwent genetic counseling and testing for hereditary cancer syndromes on 03/03/2017. His results were negative for mutations in all 46 genes analyzed by [facility] Common Hereditary Cancers Panel. Genes analyzed include: APC, ATM, AXIN2, BARD1, BMPR1A, BRCA1, BRCA2, BRIP1, CDH1, CDKN2A, CHEK2, CTNNA1, DICER1, EPCAM, GREM1, HOXB13, KIT, MEN1, MLH1, MSH2, MSH3, MSH6, MUTYH, NBN, NF1, NT  ? Hypertension   ? Neoplasm of uncertain behavior of  descending colon 01/20/2017  ? Peripheral neuropathy   ? ? ?Past Surgical History:  ?Past Surgical History:  ?Procedure Laterality Date  ? BIOPSY  05/03/2020  ? Procedure: BIOPSY;  Surgeon: Rogene Houston, MD;  [facility]: [facility];  Service: Endoscopy;;  ? COLONOSCOPY N/A 01/02/2017  ? Procedure: COLONOSCOPY;  Surgeon: Rogene Houston, MD;  [facility]: [facility];  Service: Endoscopy;  Laterality: N/A;  1000  ? COLONOSCOPY N/A 08/07/2017  ? Procedure: COLONOSCOPY;  Surgeon: Rogene Houston, MD;  [facility]: [facility];  Service: Endoscopy;  Laterality: N/A;  1045 - per Hurshel Keys of new time  ? COLONOSCOPY N/A 05/03/2020  ? Procedure: COLONOSCOPY;  Surgeon: Rogene Houston, MD;  [facility]: [facility];  Service: Endoscopy;  Laterality: N/A;  0830  ? HERNIA REPAIR    ? 71 years old  ? IR IMAGING GUIDED PORT INSERTION  10/13/2018  ? IR RADIOLOGIST EVAL & MGMT  09/07/2018  ? IR RADIOLOGIST EVAL & MGMT  11/03/2018  ? IR RADIOLOGIST EVAL & MGMT  10/25/2019  ? IR RADIOLOGIST EVAL & MGMT  03/29/2020  ? IR RADIOLOGIST EVAL & MGMT  12/25/2020  ? IR REMOVAL TUN ACCESS W/ PORT W/O FL MOD SED  05/26/2019  ? LAPAROSCOPIC SIGMOID COLECTOMY N/A 01/20/2017  ? Procedure: LAPAROSCOPIC LEFT COLECTOMY, TAKEDOWN SPLENIC FLEXURE, AND BIOPSY OF PERITONEAL NODULE;  Surgeon: Fanny Skates, MD;  Location: [facility];  Service: General;  Laterality: N/A;  ?  POLYPECTOMY  05/03/2020  ? Procedure: POLYPECTOMY;  Surgeon: Rogene Houston, MD;  [facility];  Service: Endoscopy;;  ? RADIOLOGY WITH ANESTHESIA N/A 09/29/2018  ? Procedure: CT WITH ANESTHESIA/MICROWAVE THERMAL ABLATION OF LIVER, BIOPSY;  Surgeon: Corrie Mckusick, DO;  Location: [facility];  Service: Anesthesiology;  Laterality: N/A;  ? TONSILLECTOMY    ? 71 years old  ? ? ?Allergies:  ?Allergies  ?Allergen Reactions  ? Codeine Nausea Only  ? ? ?Family History:  ?Family History  ?Problem Relation Age of Onset  ? Breast cancer Mother 59  ? Skin cancer Mother   ? Ovarian cancer Sister 19  ?      d.50 due to metastases  ? Skin cancer Maternal Uncle   ? Skin cancer Cousin 66  ? Skin cancer Cousin 43  ? ? ?Social History:  ?Social History  ? ?Tobacco Use  ? Smoking status: Never  ? Smokeless tobacco: Never  ?Vaping Use  ? Vaping Use: Never used  ?Substance Use Topics  ? Alcohol use: No  ? Drug use: No  ? ? ?Review of symptoms:  ?Constitutional:  Negative for unexplained weight loss, night sweats, fever, chills ?ENT:  Negative for nose bleeds, sinus pain, painful swallowing ?CV:  Negative for chest pain, shortness of breath, exercise intolerance, palpitations, loss of consciousness ?Resp:  Negative for cough, wheezing, shortness of breath ?GI:  Negative for nausea, vomiting, diarrhea, bloody stools ?GU:  Positives noted in HPI; otherwise negative for gross hematuria, dysuria ?Neuro:  Negative for seizures, poor balance, limb weakness, slurred speech ?Psych:  Negative for lack of energy, depression, anxiety ?Endocrine:  Negative for polydipsia, polyuria, symptoms of hypoglycemia (dizziness, hunger, sweating) ?Hematologic:  Negative for anemia, purpura, petechia, prolonged or excessive bleeding, use of anticoagulants  ?Allergic:  Negative for difficulty breathing or choking as a result of exposure to anything; no shellfish allergy; no allergic response (rash/itch) to materials, foods ? ?Physical exam: ?BP (!) 178/94   Pulse 66  ?GENERAL APPEARANCE:  Well appearing, well developed, well nourished, NAD ?HEENT: Atraumatic, Normocephalic, oropharynx clear. ?NECK: Supple without lymphadenopathy or thyromegaly. ?LUNGS: Clear to auscultation bilaterally. ?HEART: Regular Rate and Rhythm without murmurs, gallops, or rubs. ?ABDOMEN: Soft, non-tender, No Masses. ?EXTREMITIES: Moves all extremities well.  Without clubbing, cyanosis, or edema. ?NEUROLOGIC:  Alert and oriented x 3, normal gait, CN II-XII grossly intact.  ?MENTAL STATUS:  Appropriate. ?BACK:  Non-tender to palpation.  No CVAT ?SKIN:  Warm, dry and intact.    ?GU: ?Penis:  uncircumcised ?Meatus: Normal ?Scrotum: Right scrotal enlargement consistent with hydrocele; no erythema ?Testis: normal without masses bilateral ?Prostate: Unable to palpate entire gland secondary to body habitus; no tenderness ?Rectum: Normal tone,  no masses or tenderness ? ? ?Results: ?U/A: Dipstick negative ? ?Results for orders placed or performed in visit on 02/14/22 (from the past 24 hour(s))  ?BLADDER SCAN AMB NON-IMAGING  ? Collection Time: 02/14/22 11:29 AM  ?Result Value Ref Range  ? Scan Result 7   ? ? ?

## 2022-02-15 LAB — PSA, TOTAL AND FREE
PSA, Free Pct: 31.4 %
PSA, Free: 1.38 ng/mL
Prostate Specific Ag, Serum: 4.4 ng/mL — ABNORMAL HIGH (ref 0.0–4.0)

## 2022-02-19 ENCOUNTER — Inpatient Hospital Stay: Payer: Medicare Other | Attending: Oncology | Admitting: Oncology

## 2022-02-19 ENCOUNTER — Telehealth: Payer: Self-pay

## 2022-02-19 ENCOUNTER — Inpatient Hospital Stay: Payer: Medicare Other

## 2022-02-19 VITALS — BP 140/80 | HR 64 | Temp 98.1°F | Resp 18 | Ht 74.0 in | Wt 270.8 lb

## 2022-02-19 DIAGNOSIS — C186 Malignant neoplasm of descending colon: Secondary | ICD-10-CM

## 2022-02-19 DIAGNOSIS — R350 Frequency of micturition: Secondary | ICD-10-CM | POA: Insufficient documentation

## 2022-02-19 DIAGNOSIS — C787 Secondary malignant neoplasm of liver and intrahepatic bile duct: Secondary | ICD-10-CM | POA: Insufficient documentation

## 2022-02-19 LAB — CEA (ACCESS): CEA (CHCC): 13.28 ng/mL — ABNORMAL HIGH (ref 0.00–5.00)

## 2022-02-19 NOTE — Telephone Encounter (Signed)
TC to Pt spoke with [name] who stated she would relay the message to the Pt. [name] verbalized understanding. ?

## 2022-02-19 NOTE — Telephone Encounter (Signed)
-----   Message from Ladell Pier, MD sent at 02/19/2022 12:42 PM EDT ----- ?Please call patient, CEA is again mildly elevated, repeat in 4 to 6 weeks, if more elevated he will be referred for a PET scan ? ?

## 2022-02-19 NOTE — Progress Notes (Signed)
?[facility] ?OFFICE PROGRESS NOTE ? ? ?Diagnosis: Colon cancer ? ?INTERVAL HISTORY:  ? ?Jeremy Stevenson returns as scheduled.  He feels well.  Good appetite.  He saw urology for evaluation of urinary frequency.  He was started on Myrbetriq.  He has noted partial improvement. ? ?Objective: ? ?Vital signs in last 24 hours: ? ?Blood pressure 140/80, pulse 64, temperature 98.1 ?F (36.7 ?C), temperature source Oral, resp. rate 18, height '6\' 2"'  (1.88 m), weight 270 lb 12.8 oz (122.8 kg), SpO2 99 %. ?  ? ?Lymphatics: No cervical, supraclavicular, axillary, or inguinal nodes ?Resp: Clear bilaterally ?Cardio: Regular rate and rhythm ?GI: No hepatosplenomegaly, no mass, nontender ?Vascular: No leg edema, left lower extremity varicosities ? ? ?Lab Results: ? ?Lab Results  ?Component Value Date  ? WBC 9.9 02/14/2021  ? HGB 12.5 (L) 02/14/2021  ? HCT 37.6 (L) 02/14/2021  ? MCV 89.1 02/14/2021  ? PLT 188 02/14/2021  ? NEUTROABS 7.1 02/14/2021  ? ? ?CMP  ?Lab Results  ?Component Value Date  ? NA 134 (L) 02/14/2021  ? K 3.5 02/14/2021  ? CL 97 (L) 02/14/2021  ? CO2 26 02/14/2021  ? GLUCOSE 181 (H) 02/14/2021  ? BUN 19 02/14/2021  ? CREATININE 1.32 (H) 02/14/2021  ? CALCIUM 9.3 02/14/2021  ? PROT 7.0 02/14/2021  ? ALBUMIN 3.4 (L) 02/14/2021  ? AST 32 02/14/2021  ? ALT 29 02/14/2021  ? ALKPHOS 96 02/14/2021  ? BILITOT 1.0 02/14/2021  ? GFRNONAA 59 (L) 02/14/2021  ? GFRAA >60 03/07/2020  ? ? ?Lab Results  ?Component Value Date  ? CEA1 21.92 (H) 05/03/2021  ? CEA 5.48 (H) 01/15/2022  ? ? ? ?Medications: I have reviewed the patient's current medications. ? ? ?Assessment/Plan: ?Adenocarcinoma of the descending colon, stage II (T3 N0), status post a left colectomy 01/20/2017 ?MSI-stable, no loss of mismatch repair protein expression ?Elevated preoperative CEA ?Incomplete preoperative colonoscopy ?Colonoscopy 08/07/2017-multiple polyps removed including tubular adenomas ?CT abdomen/pelvis 02/05/2018-no evidence of metastatic  disease. ?Elevated CEA June 2019 ?CTs 06/29/2018-no evidence of metastatic disease, stable mild bilateral iliac adenopathy ?PET scan 08/03/2018- solitary hypermetabolic central right liver metastasis; asymmetric hypermetabolism right palatine tonsil with associated mild asymmetric soft tissue fullness on CT images ?MRI liver 08/25/2018- 2.6 cm mass in the central right liver consistent with metastasis, no other evidence of metastatic disease ?Ablation and biopsy of right liver lesion 09/29/2018-pathology revealed adenocarcinoma; foundation 1-KRAS G12V, NRAS wildtype, microsatellite stable, tumor mutational burden 1 ?Cycle 1 FOLFOX 10/25/2018 ?Cycle 2 FOLFOX 11/09/2018 ?Cycle 3 FOLFOX 11/23/2018 ?Cycle 4 FOLFOX 12/07/2018 ?Cycle 5 FOLFOX 12/21/2018 (oxaliplatin dose reduced secondary to thrombocytopenia) ?Cycle 6 FOLFOX 01/04/2019 ?Cycle 7 FOLFOX 01/18/2019 ?Cycle 8 FOLFOX 02/01/2019 ?Cycle 9 FOLFOX 02/15/2019 ?MRI abdomen 02/24/2019- ablation site in the liver without residual tumor identified.  Mild intrahepatic biliary dilatation distal to the ablation site.  Diffuse hepatic steatosis.  No new liver lesions identified. ?Cycle 10 FOLFOX 02/28/2019 (oxaliplatin held due to neuropathy and thrombocytopenia) ?Cycle 11 FOLFOX 03/14/2019 (oxaliplatin eliminated from the regimen due to neuropathy, 5-fluorouracil dose adjusted due to diarrhea and tearing) ?Cycle 12 FOLFOX 03/28/2019 (no oxaliplatin, 5-fluorouracil as per treatment 03/14/2019) ?CTs 09/08/2019-no evidence of recurrent disease, no evidence of recurrent disease at the hepatic ablation site, stable renal cysts ?CT abdomen/pelvis 03/06/2020-stable ablation site, no evidence of disease progression, stable gastric lipoma ?Colonoscopy 05/03/2020-polyp removed from the cecum-hyperplastic polyp ?Elevated CEA 09/06/2020 ?CTs 09/06/2020-no evidence of recurrent disease, stable ablation change in the right liver ?Further elevation of CEA 11/12/2020 ?  PET 11/22/2020- small focus of  hypermetabolism in the medial right liver at the previous ablation site, no other evidence of metastatic disease ?MRI liver 12/17/2020-interval contraction of the ablation site within the right hepatic lobe.  No new enhancing lesion to localize recurrence in the right hepatic lobe.  Subtle new duct dilatation in the right hepatic lobe which leads back to the same central region of increased metabolic activity on comparison FDG PET scan.  Combination of findings concerning for recurrent disease in the right hepatic lobe at the previous ablation site. ?Biopsy right liver mass near the site of prior ablation 02/14/2021-liver parenchyma with nonspecific changes and fragments of necrotic tissue ?SBRT 03/05/2021, 03/07/2021, 03/11/2021, 03/13/2021, 03/18/2021 ?MRI liver 06/03/2021-similar appearance of a 2.1 x 1.2 cm oval-shaped hypoenhancing lesion medially in the posterior right hepatic lobe associated with downstream biliary dilatation; this is the site of prior microwave ablation.  No definite abnormal enhancement.  No significant new liver lesions identified.  0.7 cm cystic lesion in the pancreatic head, possibly a postinflammatory lesion or small intraductal papillary mucinous neoplasm. ?MRI liver 01/14/2022-unchanged hypoenhancing lesion of the posterior right lobe of the liver measuring 2.0 x 1.3 cm, segmental biliary ductal dilatation posteriorly and inferiorly to the lesion with hyperemia of the liver parenchyma.  Findings consistent with ablated liver metastasis without MR evidence of residual disease.  No new liver lesions.  Hepatic steatosis.  Splenomegaly.  Unchanged 0.7 cm fluid signal cystic lesion of the ventral pancreatic uncinate.. ?  ?2.  Enlargement of the right testicle-hydrocele-Testicular ultrasound 02/26/2017-negative for mass, small bilateral hydroceles ?  ?3.  Hypertension ?  ?4.  Depression ?  ?5.  Family history of breast and ovarian cancer, negative genetic testing ?  ?6.  History of left leg edema,  pain. ?  ?7.  Pigmented lesion right lower leg-refer to dermatology 06/30/2018 ?  ?8.  Port-A-Cath placement 10/13/2018 ?  ?9.  Thrombocytopenia secondary to chemotherapy ?  ?10.  Oxaliplatin neuropathy-trial of gabapentin 12/30/2019 ?  ?  ? ? ? ?Disposition: ?Jeremy Stevenson has a history metastatic colon cancer.  He appears stable.  The CEA was lower last month.  We will follow-up on the CEA from today.  The plan is to schedule a PET scan for a progressive rise in the CEA.  He will return for an office visit and CEA in 3 months. ? ?Jeremy Coder, MD ? ?02/19/2022  ?11:50 AM ? ? ?

## 2022-02-25 DIAGNOSIS — H26493 Other secondary cataract, bilateral: Secondary | ICD-10-CM | POA: Diagnosis not present

## 2022-03-13 ENCOUNTER — Encounter: Payer: Self-pay | Admitting: Urology

## 2022-03-13 ENCOUNTER — Ambulatory Visit (INDEPENDENT_AMBULATORY_CARE_PROVIDER_SITE_OTHER): Payer: Medicare Other | Admitting: Urology

## 2022-03-13 ENCOUNTER — Other Ambulatory Visit: Payer: Self-pay

## 2022-03-13 VITALS — BP 118/78 | HR 109

## 2022-03-13 DIAGNOSIS — R350 Frequency of micturition: Secondary | ICD-10-CM | POA: Diagnosis not present

## 2022-03-13 DIAGNOSIS — R972 Elevated prostate specific antigen [PSA]: Secondary | ICD-10-CM | POA: Diagnosis not present

## 2022-03-13 DIAGNOSIS — C186 Malignant neoplasm of descending colon: Secondary | ICD-10-CM

## 2022-03-13 DIAGNOSIS — R351 Nocturia: Secondary | ICD-10-CM | POA: Diagnosis not present

## 2022-03-13 LAB — URINALYSIS, ROUTINE W REFLEX MICROSCOPIC
Bilirubin, UA: NEGATIVE
Ketones, UA: NEGATIVE
Nitrite, UA: NEGATIVE
RBC, UA: NEGATIVE
Specific Gravity, UA: 1.02 (ref 1.005–1.030)
Urobilinogen, Ur: 0.2 mg/dL (ref 0.2–1.0)
pH, UA: 6 (ref 5.0–7.5)

## 2022-03-13 LAB — MICROSCOPIC EXAMINATION
Bacteria, UA: NONE SEEN
RBC, Urine: NONE SEEN /hpf (ref 0–2)
Renal Epithel, UA: NONE SEEN /hpf

## 2022-03-13 MED ORDER — MIRABEGRON ER 25 MG PO TB24: 25.0000 mg | ORAL_TABLET | Freq: Every day | ORAL | 11 refills | Status: DC

## 2022-03-13 NOTE — Progress Notes (Signed)
? ?Assessment: ?1. Urinary frequency   ?2. Nocturia   ?3. Elevated PSA   ? ?Plan: ?I discussed the PSA results with the patient today.  His PSA is consistent with BPH.  Recommend continued observation at this time. ?Continue Myrbetriq 25 mg daily.  Samples and rx given.   ?Return to office in 3 months. ? ?Chief Complaint:  ?Chief Complaint  ?Patient presents with  ? Urinary Frequency  ? ? ?History of Present Illness: ? ?Jeremy Stevenson is a 71 y.o. year old male who is seen for further evaluation of lower urinary tract symptoms.  At his initial visit in March 2023, he reported a 10-year history of lower urinary tract symptoms with worsening in the past several years.  He has urinary frequency, urgency, and nocturia, voiding 2 times per hour during the night.  He also noted urge incontinence and an intermittent stream at times.  No dysuria or gross hematuria.  No recent UTIs.  He was previously treated with alfuzosin but did not see an improvement in his symptoms.  He was not  taking any medication for his urinary symptoms at the time of his visit in March 2023. ?IPSS = 21 ?PVR = 7 ml ? ?PSA from 10/22/2021: 4.6 ?PSA from  02/14/2022: 4.4  with 31.4% free ?No known history of elevated PSA.   ? ?He has a history of colon cancer and is status post colectomy.  He has received chemotherapy and radiation therapy for liver metastasis.  He is currently followed by oncology in [place]. ? ?He was started on Myrbetriq 25 mg daily. ? ?He returns today for follow-up.  He has noted improvement in his urinary symptoms with Myrbetriq.  He has decreased frequency and decreased nocturia.  No side effects from the medication.  No dysuria or gross hematuria. ? ?Portions of the above documentation were copied from a prior visit for review purposes only. ? ? ? ?Past Medical History:  ?Past Medical History:  ?Diagnosis Date  ? Colon cancer ([place])   ? Depression   ? Genetic testing 03/16/2017  ? Jeremy Stevenson underwent genetic counseling and  testing for hereditary cancer syndromes on 03/03/2017. His results were negative for mutations in all 46 genes analyzed by [facility]'s Common Hereditary Cancers Panel. Genes analyzed include: APC, ATM, AXIN2, BARD1, BMPR1A, BRCA1, BRCA2, BRIP1, CDH1, CDKN2A, CHEK2, CTNNA1, DICER1, EPCAM, GREM1, HOXB13, KIT, MEN1, MLH1, MSH2, MSH3, MSH6, MUTYH, NBN, NF1, NT  ? Hypertension   ? Neoplasm of uncertain behavior of descending colon 01/20/2017  ? Peripheral neuropathy   ? ? ?Past Surgical History:  ?Past Surgical History:  ?Procedure Laterality Date  ? BIOPSY  05/03/2020  ? Procedure: BIOPSY;  Surgeon: Rogene Houston, MD;  [facility];  Service: Endoscopy;;  ? COLONOSCOPY N/A 01/02/2017  ? Procedure: COLONOSCOPY;  Surgeon: Rogene Houston, MD;  [facility];  Service: Endoscopy;  Laterality: N/A;  1000  ? COLONOSCOPY N/A 08/07/2017  ? Procedure: COLONOSCOPY;  Surgeon: Rogene Houston, MD;  [facility];  Service: Endoscopy;  Laterality: N/A;  1045 - per Hurshel Keys of new time  ? COLONOSCOPY N/A 05/03/2020  ? Procedure: COLONOSCOPY;  Surgeon: Rogene Houston, MD;  [facility];  Service: Endoscopy;  Laterality: N/A;  0830  ? HERNIA REPAIR    ? 71 years old  ? IR IMAGING GUIDED PORT INSERTION  10/13/2018  ? IR RADIOLOGIST EVAL & MGMT  09/07/2018  ? IR RADIOLOGIST EVAL & MGMT  11/03/2018  ? IR RADIOLOGIST EVAL & MGMT  10/25/2019  ? IR RADIOLOGIST EVAL & MGMT  03/29/2020  ? IR RADIOLOGIST EVAL & MGMT  12/25/2020  ? IR REMOVAL TUN ACCESS W/ PORT W/O FL MOD SED  05/26/2019  ? LAPAROSCOPIC SIGMOID COLECTOMY N/A 01/20/2017  ? Procedure: LAPAROSCOPIC LEFT COLECTOMY, TAKEDOWN SPLENIC FLEXURE, AND BIOPSY OF PERITONEAL NODULE;  Surgeon: Fanny Skates, MD;  [facility];  Service: General;  Laterality: N/A;  ? POLYPECTOMY  05/03/2020  ? Procedure: POLYPECTOMY;  Surgeon: Rogene Houston, MD;  [facility];  Service: Endoscopy;;  ? RADIOLOGY WITH ANESTHESIA N/A 09/29/2018  ? Procedure: CT WITH  ANESTHESIA/MICROWAVE THERMAL ABLATION OF LIVER, BIOPSY;  Surgeon: Corrie Mckusick, DO;  [facility];  Service: Anesthesiology;  Laterality: N/A;  ? TONSILLECTOMY    ? 71 years old  ? ? ?Allergies:  ?Allergies  ?Allergen Reactions  ? Codeine Nausea Only  ? ? ?Family History:  ?Family History  ?Problem Relation Age of Onset  ? Breast cancer Mother 64  ? Skin cancer Mother   ? Ovarian cancer Sister 55  ?     d.50 due to metastases  ? Skin cancer Maternal Uncle   ? Skin cancer Cousin 25  ? Skin cancer Cousin 15  ? ? ?Social History:  ?Social History  ? ?Tobacco Use  ? Smoking status: Never  ? Smokeless tobacco: Never  ?Vaping Use  ? Vaping Use: Never used  ?Substance Use Topics  ? Alcohol use: No  ? Drug use: No  ? ? ?ROS: ?Constitutional:  Negative for fever, chills, weight loss ?CV: Negative for chest pain, previous MI, hypertension ?Respiratory:  Negative for shortness of breath, wheezing, sleep apnea, frequent cough ?GI:  Negative for nausea, vomiting, bloody stool, GERD ? ?Physical exam: ?BP 118/78   Pulse (!) 109  ?GENERAL APPEARANCE:  Well appearing, well developed, well nourished, NAD ?HEENT:  Atraumatic, normocephalic, oropharynx clear ?NECK:  Supple without lymphadenopathy or thyromegaly ?ABDOMEN:  Soft, non-tender, no masses ?EXTREMITIES:  Moves all extremities well, without clubbing, cyanosis, or edema ?NEUROLOGIC:  Alert and oriented x 3, normal gait, CN II-XII grossly intact ?MENTAL STATUS:  appropriate ?BACK:  Non-tender to palpation, No CVAT ?SKIN:  Warm, dry, and intact ? ?Results: ?U/A 0-5 WBCs ?

## 2022-03-19 ENCOUNTER — Inpatient Hospital Stay: Payer: Medicare Other | Attending: Oncology

## 2022-03-19 DIAGNOSIS — Z85038 Personal history of other malignant neoplasm of large intestine: Secondary | ICD-10-CM | POA: Insufficient documentation

## 2022-03-19 DIAGNOSIS — C186 Malignant neoplasm of descending colon: Secondary | ICD-10-CM

## 2022-03-19 LAB — CEA (ACCESS): CEA (CHCC): 18.55 ng/mL — ABNORMAL HIGH (ref 0.00–5.00)

## 2022-03-20 ENCOUNTER — Telehealth: Payer: Self-pay

## 2022-03-20 DIAGNOSIS — C186 Malignant neoplasm of descending colon: Secondary | ICD-10-CM

## 2022-03-20 NOTE — Telephone Encounter (Signed)
-----   Message from Ladell Pier, MD sent at 03/19/2022  8:06 PM EDT ----- ?Please call patient, the CEA is slightly higher, repeat in 1 month.  If higher again next month we will schedule a staging PET scan prior to the next office visit ? ?

## 2022-03-20 NOTE — Telephone Encounter (Signed)
Pt and [name] verbalized understanding. 

## 2022-03-28 ENCOUNTER — Encounter: Payer: Self-pay | Admitting: Oncology

## 2022-04-16 DIAGNOSIS — R55 Syncope and collapse: Secondary | ICD-10-CM | POA: Diagnosis not present

## 2022-04-16 DIAGNOSIS — I10 Essential (primary) hypertension: Secondary | ICD-10-CM | POA: Diagnosis not present

## 2022-04-21 ENCOUNTER — Inpatient Hospital Stay: Payer: Medicare Other | Attending: Oncology

## 2022-04-21 DIAGNOSIS — Z85038 Personal history of other malignant neoplasm of large intestine: Secondary | ICD-10-CM | POA: Diagnosis not present

## 2022-04-21 DIAGNOSIS — Z299 Encounter for prophylactic measures, unspecified: Secondary | ICD-10-CM | POA: Diagnosis not present

## 2022-04-21 DIAGNOSIS — N400 Enlarged prostate without lower urinary tract symptoms: Secondary | ICD-10-CM | POA: Diagnosis not present

## 2022-04-21 DIAGNOSIS — Z6835 Body mass index (BMI) 35.0-35.9, adult: Secondary | ICD-10-CM | POA: Diagnosis not present

## 2022-04-21 DIAGNOSIS — I10 Essential (primary) hypertension: Secondary | ICD-10-CM | POA: Diagnosis not present

## 2022-04-21 DIAGNOSIS — C186 Malignant neoplasm of descending colon: Secondary | ICD-10-CM

## 2022-04-21 DIAGNOSIS — C787 Secondary malignant neoplasm of liver and intrahepatic bile duct: Secondary | ICD-10-CM | POA: Diagnosis not present

## 2022-04-21 LAB — CEA (ACCESS): CEA (CHCC): 36.2 ng/mL — ABNORMAL HIGH (ref 0.00–5.00)

## 2022-04-22 ENCOUNTER — Telehealth: Payer: Self-pay

## 2022-04-22 NOTE — Telephone Encounter (Signed)
-----   Message from Ladell Pier, MD sent at 04/21/2022  8:17 PM EDT ----- Please call patient, CEA is higher, scheduled PET, restaging skull base to thigh, prior to next visit

## 2022-04-22 NOTE — Telephone Encounter (Signed)
TC to Pt. Message given to [name] who stated she would relay message to Pt. [name] verbalized understanding. Pet scan will be ordered once prior approval obtained.

## 2022-04-25 ENCOUNTER — Other Ambulatory Visit: Payer: Self-pay

## 2022-04-25 DIAGNOSIS — C186 Malignant neoplasm of descending colon: Secondary | ICD-10-CM

## 2022-05-14 ENCOUNTER — Encounter (HOSPITAL_COMMUNITY)
Admission: RE | Admit: 2022-05-14 | Discharge: 2022-05-14 | Disposition: A | Discharge status: Home / Self Care | Payer: Medicare Other | Source: Ambulatory Visit | Attending: Oncology | Admitting: Oncology

## 2022-05-14 DIAGNOSIS — Z9889 Other specified postprocedural states: Secondary | ICD-10-CM | POA: Diagnosis not present

## 2022-05-14 DIAGNOSIS — C186 Malignant neoplasm of descending colon: Secondary | ICD-10-CM | POA: Insufficient documentation

## 2022-05-14 DIAGNOSIS — K7689 Other specified diseases of liver: Secondary | ICD-10-CM | POA: Diagnosis not present

## 2022-05-14 DIAGNOSIS — Z85038 Personal history of other malignant neoplasm of large intestine: Secondary | ICD-10-CM | POA: Insufficient documentation

## 2022-05-14 DIAGNOSIS — N400 Enlarged prostate without lower urinary tract symptoms: Secondary | ICD-10-CM | POA: Diagnosis not present

## 2022-05-14 DIAGNOSIS — C189 Malignant neoplasm of colon, unspecified: Secondary | ICD-10-CM | POA: Diagnosis not present

## 2022-05-14 LAB — GLUCOSE, CAPILLARY
Glucose-Capillary: 248 mg/dL — ABNORMAL HIGH (ref 70–99)
Glucose-Capillary: 263 mg/dL — ABNORMAL HIGH (ref 70–99)

## 2022-05-14 MED ORDER — FLUDEOXYGLUCOSE F - 18 (FDG) INJECTION
13.5000 | Freq: Once | INTRAVENOUS | Status: AC | PRN
  Administered 2022-05-14: 13.3 via INTRAVENOUS

## 2022-05-21 ENCOUNTER — Inpatient Hospital Stay (HOSPITAL_BASED_OUTPATIENT_CLINIC_OR_DEPARTMENT_OTHER): Payer: Medicare Other | Admitting: Nurse Practitioner

## 2022-05-21 ENCOUNTER — Inpatient Hospital Stay: Payer: Medicare Other | Attending: Oncology

## 2022-05-21 ENCOUNTER — Encounter: Payer: Self-pay | Admitting: *Deleted

## 2022-05-21 ENCOUNTER — Encounter: Payer: Self-pay | Admitting: Nurse Practitioner

## 2022-05-21 VITALS — BP 140/80 | HR 77 | Temp 98.2°F | Resp 20 | Ht 74.0 in | Wt 267.0 lb

## 2022-05-21 DIAGNOSIS — Z85038 Personal history of other malignant neoplasm of large intestine: Secondary | ICD-10-CM | POA: Insufficient documentation

## 2022-05-21 DIAGNOSIS — C186 Malignant neoplasm of descending colon: Secondary | ICD-10-CM

## 2022-05-21 DIAGNOSIS — K760 Fatty (change of) liver, not elsewhere classified: Secondary | ICD-10-CM | POA: Diagnosis not present

## 2022-05-21 LAB — CEA (ACCESS): CEA (CHCC): 70.21 ng/mL — ABNORMAL HIGH (ref 0.00–5.00)

## 2022-05-21 NOTE — Progress Notes (Unsigned)
[place] OFFICE PROGRESS NOTE   Diagnosis: Colon cancer  INTERVAL HISTORY:   Jeremy Stevenson returns as scheduled.  Overall he feels well.  He has a good appetite.  Persistent neuropathy symptoms hands and feet.  Objective:  Vital signs in last 24 hours:  Blood pressure 140/80, pulse 77, temperature 98.2 F (36.8 C), temperature source Oral, resp. rate 20, height '6\' 2"'  (1.88 m), weight 267 lb (121.1 kg), SpO2 98 %.    Resp: Lungs clear bilaterally. Cardio: Regular rate and rhythm. GI: No hepatomegaly. Vascular: No leg edema.  Bilateral lower extremity varicosities.   Lab Results:  Lab Results  Component Value Date   WBC 9.9 02/14/2021   HGB 12.5 (L) 02/14/2021   HCT 37.6 (L) 02/14/2021   MCV 89.1 02/14/2021   PLT 188 02/14/2021   NEUTROABS 7.1 02/14/2021    Imaging:  No results found.  Medications: I have reviewed the patient's current medications.  Assessment/Plan: Adenocarcinoma of the descending colon, stage II (T3 N0), status post a left colectomy 01/20/2017 MSI-stable, no loss of mismatch repair protein expression Elevated preoperative CEA Incomplete preoperative colonoscopy Colonoscopy 08/07/2017-multiple polyps removed including tubular adenomas CT abdomen/pelvis 02/05/2018-no evidence of metastatic disease. Elevated CEA June 2019 CTs 06/29/2018-no evidence of metastatic disease, stable mild bilateral iliac adenopathy PET scan 08/03/2018- solitary hypermetabolic central right liver metastasis; asymmetric hypermetabolism right palatine tonsil with associated mild asymmetric soft tissue fullness on CT images MRI liver 08/25/2018- 2.6 cm mass in the central right liver consistent with metastasis, no other evidence of metastatic disease Ablation and biopsy of right liver lesion 09/29/2018-pathology revealed adenocarcinoma; foundation 1-KRAS G12V, NRAS wildtype, microsatellite stable, tumor mutational burden 1 Cycle 1 FOLFOX 10/25/2018 Cycle 2 FOLFOX  11/09/2018 Cycle 3 FOLFOX 11/23/2018 Cycle 4 FOLFOX 12/07/2018 Cycle 5 FOLFOX 12/21/2018 (oxaliplatin dose reduced secondary to thrombocytopenia) Cycle 6 FOLFOX 01/04/2019 Cycle 7 FOLFOX 01/18/2019 Cycle 8 FOLFOX 02/01/2019 Cycle 9 FOLFOX 02/15/2019 MRI abdomen 02/24/2019- ablation site in the liver without residual tumor identified.  Mild intrahepatic biliary dilatation distal to the ablation site.  Diffuse hepatic steatosis.  No new liver lesions identified. Cycle 10 FOLFOX 02/28/2019 (oxaliplatin held due to neuropathy and thrombocytopenia) Cycle 11 FOLFOX 03/14/2019 (oxaliplatin eliminated from the regimen due to neuropathy, 5-fluorouracil dose adjusted due to diarrhea and tearing) Cycle 12 FOLFOX 03/28/2019 (no oxaliplatin, 5-fluorouracil as per treatment 03/14/2019) CTs 09/08/2019-no evidence of recurrent disease, no evidence of recurrent disease at the hepatic ablation site, stable renal cysts CT abdomen/pelvis 03/06/2020-stable ablation site, no evidence of disease progression, stable gastric lipoma Colonoscopy 05/03/2020-polyp removed from the cecum-hyperplastic polyp Elevated CEA 09/06/2020 CTs 09/06/2020-no evidence of recurrent disease, stable ablation change in the right liver Further elevation of CEA 11/12/2020 PET 11/22/2020- small focus of hypermetabolism in the medial right liver at the previous ablation site, no other evidence of metastatic disease MRI liver 12/17/2020-interval contraction of the ablation site within the right hepatic lobe.  No new enhancing lesion to localize recurrence in the right hepatic lobe.  Subtle new duct dilatation in the right hepatic lobe which leads back to the same central region of increased metabolic activity on comparison FDG PET scan.  Combination of findings concerning for recurrent disease in the right hepatic lobe at the previous ablation site. Biopsy right liver mass near the site of prior ablation 02/14/2021-liver parenchyma with nonspecific changes and  fragments of necrotic tissue SBRT 03/05/2021, 03/07/2021, 03/11/2021, 03/13/2021, 03/18/2021 MRI liver 06/03/2021-similar appearance of a 2.1 x 1.2 cm oval-shaped hypoenhancing lesion medially in  the posterior right hepatic lobe associated with downstream biliary dilatation; this is the site of prior microwave ablation.  No definite abnormal enhancement.  No significant new liver lesions identified.  0.7 cm cystic lesion in the pancreatic head, possibly a postinflammatory lesion or small intraductal papillary mucinous neoplasm. MRI liver 01/14/2022-unchanged hypoenhancing lesion of the posterior right lobe of the liver measuring 2.0 x 1.3 cm, segmental biliary ductal dilatation posteriorly and inferiorly to the lesion with hyperemia of the liver parenchyma.  Findings consistent with ablated liver metastasis without MR evidence of residual disease.  No new liver lesions.  Hepatic steatosis.  Splenomegaly.  Unchanged 0.7 cm fluid signal cystic lesion of the ventral pancreatic uncinate. PET scan 05/14/2022-focal activity posterior right hepatic lobe which corresponds to hypoenhancing lesion on comparison MRI   2.  Enlargement of the right testicle-hydrocele-Testicular ultrasound 02/26/2017-negative for mass, small bilateral hydroceles   3.  Hypertension   4.  Depression   5.  Family history of breast and ovarian cancer, negative genetic testing   6.  History of left leg edema, pain.   7.  Pigmented lesion right lower leg-refer to dermatology 06/30/2018   8.  Port-A-Cath placement 10/13/2018   9.  Thrombocytopenia secondary to chemotherapy   10.  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  The recent PET scan shows a focal activity posterior right hepatic lobe.  Jeremy Stevenson and his wife understand this corresponds to the lesion seen on the MRI.  Results/images reviewed with him at today's visit.  Dr. Benay Spice recommends a referral to see if he is a  candidate for resection.  We made a referral to Dr. Michaelle Birks.  He will return for follow-up in 2 to 3 weeks.  Patient seen with Dr. Benay Spice.    [name] ANP/GNP-BC   05/21/2022  10:39 AM This was a shared visit with [name].  We reviewed the PET findings and images with Jeremy Stevenson and his wife.  He appears to have progressive disease involving the area of the previous ablation and SBRT.  The CEA is elevated.  There is no other PET evidence of metastatic disease.  We will refer him to Dr. Zenia Resides to consider the indication for resection of the right liver lesion.  I was present for greater than 50% of today's visit.  I performed medical decision making.  Julieanne Manson, MD

## 2022-05-21 NOTE — Progress Notes (Signed)
Referral placed for Dr Michaelle Birks to evaluate patient for surgery for isolated liver lesion

## 2022-05-22 ENCOUNTER — Encounter: Payer: Self-pay | Admitting: Oncology

## 2022-05-27 ENCOUNTER — Telehealth: Payer: Self-pay

## 2022-05-27 NOTE — Telephone Encounter (Signed)
Mrs. Jeremy Stevenson called in and want to know when  [facility] was go to call them. I called over to [facility] and spoke with intake, she stated her coworker called the Jeremy Stevenson several time no answer, voice mail was not set up, can't leave a message. I advice Mrs. Jeremy Stevenson to call [facility] [phone] and make the appointment to come in. Patient gave verbal understanding and had no further questions or concerns.

## 2022-05-28 ENCOUNTER — Other Ambulatory Visit: Payer: Self-pay

## 2022-05-28 NOTE — Progress Notes (Signed)
The proposed treatment discussed in conference is for discussion purpose only and is not a binding recommendation.  The patients have not been physically examined, or presented with their treatment options.  Therefore, final treatment plans cannot be decided.  

## 2022-05-29 ENCOUNTER — Telehealth: Payer: Self-pay | Admitting: *Deleted

## 2022-05-29 ENCOUNTER — Other Ambulatory Visit: Payer: Self-pay | Admitting: Nurse Practitioner

## 2022-05-29 NOTE — Telephone Encounter (Signed)
Case review in GI tumor board 05/28/22. Surgeon/radiology suggests biopsy of suspicious liver lesion to confirm it is a positive cancer versus a reactive change related to ablation. Wishes confirmation before putting him through surgery. Spoke w/patient and his wife and they agree to this plan. NP is putting in orders. He already has appointment on 7/19 at 1:30 pm with Dr. Zenia Resides. Informed her interventional radiology will call to schedule the biopsy.

## 2022-05-30 ENCOUNTER — Encounter: Payer: Self-pay | Admitting: *Deleted

## 2022-05-30 ENCOUNTER — Other Ambulatory Visit: Payer: Self-pay | Admitting: Nurse Practitioner

## 2022-05-30 DIAGNOSIS — C186 Malignant neoplasm of descending colon: Secondary | ICD-10-CM

## 2022-05-30 NOTE — Progress Notes (Signed)
Notified IR scheduler for biopsy of liver mass. Confirmed with Vivien Rota that case is in review w/radiologist and patient will be called when approved.

## 2022-06-02 ENCOUNTER — Encounter: Payer: Self-pay | Admitting: *Deleted

## 2022-06-02 NOTE — Progress Notes (Unsigned)
Arne Cleveland, MD  Jeremy Stevenson   CT core liver lesion  Anterior to bil dil on PET Im 109 Se 4 and 109/604  May need contrast to confirm   DDH

## 2022-06-03 ENCOUNTER — Telehealth: Payer: Self-pay | Admitting: *Deleted

## 2022-06-03 NOTE — Telephone Encounter (Signed)
CT liver biopsy scheduled for 7/31. Per Dr. Benay Spice: move 7/26 visit to late in week of 7/31. Notified wife and sent scheduling message for OV on 8/3 or 8/4

## 2022-06-04 DIAGNOSIS — C787 Secondary malignant neoplasm of liver and intrahepatic bile duct: Secondary | ICD-10-CM | POA: Diagnosis not present

## 2022-06-04 DIAGNOSIS — C189 Malignant neoplasm of colon, unspecified: Secondary | ICD-10-CM | POA: Diagnosis not present

## 2022-06-09 ENCOUNTER — Other Ambulatory Visit: Payer: Self-pay

## 2022-06-11 ENCOUNTER — Inpatient Hospital Stay: Payer: Medicare Other | Admitting: Nurse Practitioner

## 2022-06-12 ENCOUNTER — Other Ambulatory Visit: Payer: Self-pay | Admitting: Student

## 2022-06-12 ENCOUNTER — Encounter: Payer: Self-pay | Admitting: Urology

## 2022-06-12 ENCOUNTER — Ambulatory Visit (INDEPENDENT_AMBULATORY_CARE_PROVIDER_SITE_OTHER): Payer: Medicare Other | Admitting: Urology

## 2022-06-12 VITALS — BP 142/84 | HR 86 | Ht 74.0 in | Wt 265.0 lb

## 2022-06-12 DIAGNOSIS — R8290 Unspecified abnormal findings in urine: Secondary | ICD-10-CM | POA: Diagnosis not present

## 2022-06-12 DIAGNOSIS — R350 Frequency of micturition: Secondary | ICD-10-CM

## 2022-06-12 DIAGNOSIS — R972 Elevated prostate specific antigen [PSA]: Secondary | ICD-10-CM

## 2022-06-12 DIAGNOSIS — C186 Malignant neoplasm of descending colon: Secondary | ICD-10-CM

## 2022-06-12 DIAGNOSIS — R351 Nocturia: Secondary | ICD-10-CM

## 2022-06-12 DIAGNOSIS — K769 Liver disease, unspecified: Secondary | ICD-10-CM

## 2022-06-12 LAB — BLADDER SCAN AMB NON-IMAGING: Scan Result: 70

## 2022-06-12 MED ORDER — MIRABEGRON ER 25 MG PO TB24: 25.0000 mg | ORAL_TABLET | Freq: Every day | ORAL | 11 refills | Status: DC

## 2022-06-12 NOTE — Progress Notes (Signed)
Assessment: 1. Urinary frequency   2. Nocturia   3. Elevated PSA   4. Abnormal urine findings     Plan: Urine culture sent today - will call with results Continue Myrbetriq 25 mg daily.  I advised him that the medication will have better results if he takes it on a daily basis. Return to office in 3 months.   Chief Complaint:  Chief Complaint  Patient presents with   Urinary Frequency    History of Present Illness:  Jeremy Stevenson is a 71 y.o. year old male who is seen for further evaluation of lower urinary tract symptoms.  At his initial visit in March 2023, he reported a 10-year history of lower urinary tract symptoms with worsening in the past several years.  He has urinary frequency, urgency, and nocturia, voiding 2 times per hour during the night.  He also noted urge incontinence and an intermittent stream at times.  No dysuria or gross hematuria.  No recent UTIs.  He was previously treated with alfuzosin but did not see an improvement in his symptoms.  He was not  taking any medication for his urinary symptoms at the time of his visit in March 2023. IPSS = 21 PVR = 7 ml  PSA from 10/22/2021: 4.6 PSA from  02/14/2022: 4.4  with 31.4% free  He has a history of colon cancer and is status post colectomy.  He has received chemotherapy and radiation therapy for liver metastasis.  He is currently followed by oncology in [place].  He was started on Myrbetriq 25 mg daily. At his visit in April 2023, he had noted improvement in his urinary symptoms with Myrbetriq with decreased frequency and decreased nocturia.  No side effects from the medication.  No dysuria or gross hematuria.  He returns today for follow-up.  He continues on Myrbetriq 25 mg.  He is not taking this on a regular basis.  He continues to have frequency, voiding every 2 hours, urgency, and occasional urge incontinence.  He also has nocturia 3-4 times.  No dysuria or gross hematuria. He is scheduled to undergo a  liver biopsy next week.  Portions of the above documentation were copied from a prior visit for review purposes only.    Past Medical History:  Past Medical History:  Diagnosis Date   Colon cancer [facility])    Depression    Genetic testing 03/16/2017   [name] underwent genetic counseling and testing for hereditary cancer syndromes on 03/03/2017. His results were negative for mutations in all 46 genes analyzed by [facility]'s Common Hereditary Cancers Panel. 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 01/20/2017   Peripheral neuropathy     Past Surgical History:  Past Surgical History:  Procedure Laterality Date   BIOPSY  05/03/2020   Procedure: BIOPSY;  Surgeon: Rogene Houston, MD;  [facility];  Service: Endoscopy;;   COLONOSCOPY N/A 01/02/2017   Procedure: COLONOSCOPY;  Surgeon: Rogene Houston, MD;  [facility];  Service: Endoscopy;  Laterality: N/A;  1000   COLONOSCOPY N/A 08/07/2017   Procedure: COLONOSCOPY;  Surgeon: Rogene Houston, MD;  [facility];  Service: Endoscopy;  Laterality: N/A;  1045 - per Hurshel Keys of new time   COLONOSCOPY N/A 05/03/2020   Procedure:  COLONOSCOPY;  Surgeon: Rogene Houston, MD;  [facility];  Service: Endoscopy;  Laterality: N/A;  0830   HERNIA REPAIR     71 years old   IR IMAGING GUIDED PORT INSERTION  10/13/2018   IR RADIOLOGIST EVAL & MGMT  09/07/2018   IR RADIOLOGIST EVAL & MGMT  11/03/2018   IR RADIOLOGIST EVAL & MGMT  10/25/2019   IR RADIOLOGIST EVAL & MGMT  03/29/2020   IR RADIOLOGIST EVAL & MGMT  12/25/2020   IR REMOVAL TUN ACCESS W/ PORT W/O FL MOD SED  05/26/2019   LAPAROSCOPIC SIGMOID COLECTOMY N/A 01/20/2017   Procedure: LAPAROSCOPIC LEFT COLECTOMY, TAKEDOWN SPLENIC FLEXURE, AND BIOPSY OF  PERITONEAL NODULE;  Surgeon: Fanny Skates, MD;  [facility];  Service: General;  Laterality: N/A;   POLYPECTOMY  05/03/2020   Procedure: POLYPECTOMY;  Surgeon: Rogene Houston, MD;  [facility];  Service: Endoscopy;;   RADIOLOGY WITH ANESTHESIA N/A 09/29/2018   Procedure: CT WITH ANESTHESIA/MICROWAVE THERMAL ABLATION OF LIVER, BIOPSY;  Surgeon: Corrie Mckusick, DO;  [facility];  Service: Anesthesiology;  Laterality: N/A;   TONSILLECTOMY     71 years old    Allergies:  Allergies  Allergen Reactions   Codeine Nausea Only    Family History:  Family History  Problem Relation Age of Onset   Breast cancer Mother 17   Skin cancer Mother    Ovarian cancer Sister 49       d.50 due to metastases   Skin cancer Maternal Uncle    Skin cancer Cousin 61   Skin cancer Cousin 60    Social History:  Social History   Tobacco Use   Smoking status: Never   Smokeless tobacco: Never  Vaping Use   Vaping Use: Never used  Substance Use Topics   Alcohol use: No   Drug use: No    ROS: Constitutional:  Negative for fever, chills, weight loss CV: Negative for chest pain, previous MI, hypertension Respiratory:  Negative for shortness of breath, wheezing, sleep apnea, frequent cough GI:  Negative for nausea, vomiting, bloody stool, GERD  Physical exam: BP (!) 142/84   Pulse 86   Ht _0  (1.88 m)   Wt 265 lb (120.2 kg)   BMI 34.02 kg/m  GENERAL APPEARANCE:  Well appearing, well developed, well nourished, NAD HEENT:  Atraumatic, normocephalic, oropharynx clear NECK:  Supple without lymphadenopathy or thyromegaly ABDOMEN:  Soft, non-tender, no masses EXTREMITIES:  Moves all extremities well, without clubbing, cyanosis, or edema NEUROLOGIC:  Alert and oriented x 3, normal gait, CN II-XII grossly intact MENTAL STATUS:  appropriate BACK:  Non-tender to palpation, No CVAT SKIN:  Warm, dry, and intact  Results: U/A: >30 WBC  PVR = 70 ml

## 2022-06-12 NOTE — Progress Notes (Signed)
post void residual =51m

## 2022-06-13 ENCOUNTER — Other Ambulatory Visit: Payer: Self-pay

## 2022-06-13 LAB — MICROSCOPIC EXAMINATION
Bacteria, UA: NONE SEEN
Epithelial Cells (non renal): NONE SEEN /hpf (ref 0–10)
RBC, Urine: NONE SEEN /hpf (ref 0–2)
Renal Epithel, UA: NONE SEEN /hpf
WBC, UA: >30 /hpf — AB (ref 0–5)

## 2022-06-13 LAB — URINALYSIS, ROUTINE W REFLEX MICROSCOPIC
Bilirubin, UA: NEGATIVE
Ketones, UA: NEGATIVE
Nitrite, UA: NEGATIVE
RBC, UA: NEGATIVE
Specific Gravity, UA: 1.015 (ref 1.005–1.030)
Urobilinogen, Ur: 0.2 mg/dL (ref 0.2–1.0)
pH, UA: 6 (ref 5.0–7.5)

## 2022-06-16 ENCOUNTER — Ambulatory Visit (HOSPITAL_COMMUNITY)
Admission: RE | Admit: 2022-06-16 | Discharge: 2022-06-16 | Disposition: A | Discharge status: Home / Self Care | Payer: Medicare Other | Source: Ambulatory Visit | Attending: Nurse Practitioner | Admitting: Nurse Practitioner

## 2022-06-16 ENCOUNTER — Other Ambulatory Visit: Payer: Self-pay | Admitting: Nurse Practitioner

## 2022-06-16 ENCOUNTER — Other Ambulatory Visit: Payer: Self-pay

## 2022-06-16 ENCOUNTER — Encounter (HOSPITAL_COMMUNITY): Payer: Self-pay

## 2022-06-16 DIAGNOSIS — C186 Malignant neoplasm of descending colon: Secondary | ICD-10-CM | POA: Insufficient documentation

## 2022-06-16 DIAGNOSIS — C787 Secondary malignant neoplasm of liver and intrahepatic bile duct: Secondary | ICD-10-CM | POA: Diagnosis not present

## 2022-06-16 DIAGNOSIS — Z85038 Personal history of other malignant neoplasm of large intestine: Secondary | ICD-10-CM | POA: Diagnosis not present

## 2022-06-16 DIAGNOSIS — C189 Malignant neoplasm of colon, unspecified: Secondary | ICD-10-CM | POA: Diagnosis present

## 2022-06-16 DIAGNOSIS — K769 Liver disease, unspecified: Secondary | ICD-10-CM

## 2022-06-16 DIAGNOSIS — K7689 Other specified diseases of liver: Secondary | ICD-10-CM | POA: Diagnosis not present

## 2022-06-16 DIAGNOSIS — K759 Inflammatory liver disease, unspecified: Secondary | ICD-10-CM | POA: Diagnosis not present

## 2022-06-16 LAB — CBC WITH DIFFERENTIAL/PLATELET
Abs Immature Granulocytes: 0.14 10*3/uL — ABNORMAL HIGH (ref 0.00–0.07)
Basophils Absolute: 0.1 10*3/uL (ref 0.0–0.1)
Basophils Relative: 1 %
Eosinophils Absolute: 0.3 10*3/uL (ref 0.0–0.5)
Eosinophils Relative: 3 %
HCT: 38.3 % — ABNORMAL LOW (ref 39.0–52.0)
Hemoglobin: 12.9 g/dL — ABNORMAL LOW (ref 13.0–17.0)
Immature Granulocytes: 1 %
Lymphocytes Relative: 11 %
Lymphs Abs: 1.2 10*3/uL (ref 0.7–4.0)
MCH: 28.9 pg (ref 26.0–34.0)
MCHC: 33.7 g/dL (ref 30.0–36.0)
MCV: 85.9 fL (ref 80.0–100.0)
Monocytes Absolute: 0.8 10*3/uL (ref 0.1–1.0)
Monocytes Relative: 7 %
Neutro Abs: 8.7 10*3/uL — ABNORMAL HIGH (ref 1.7–7.7)
Neutrophils Relative %: 77 %
Platelets: 157 10*3/uL (ref 150–400)
RBC: 4.46 MIL/uL (ref 4.22–5.81)
RDW: 13.8 % (ref 11.5–15.5)
WBC: 11.3 10*3/uL — ABNORMAL HIGH (ref 4.0–10.5)
nRBC: 0 % (ref 0.0–0.2)

## 2022-06-16 LAB — PROTIME-INR
INR: 1.1 (ref 0.8–1.2)
Prothrombin Time: 14.1 seconds (ref 11.4–15.2)

## 2022-06-16 LAB — COMPREHENSIVE METABOLIC PANEL
ALT: 20 U/L (ref 0–44)
AST: 23 U/L (ref 15–41)
Albumin: 3.2 g/dL — ABNORMAL LOW (ref 3.5–5.0)
Alkaline Phosphatase: 101 U/L (ref 38–126)
Anion gap: 9 (ref 5–15)
BUN: 23 mg/dL (ref 8–23)
CO2: 30 mmol/L (ref 22–32)
Calcium: 9.9 mg/dL (ref 8.9–10.3)
Chloride: 97 mmol/L — ABNORMAL LOW (ref 98–111)
Creatinine, Ser: 1.35 mg/dL — ABNORMAL HIGH (ref 0.61–1.24)
GFR, Estimated: 57 mL/min — ABNORMAL LOW (ref >60)
Glucose, Bld: 262 mg/dL — ABNORMAL HIGH (ref 70–99)
Potassium: 3.9 mmol/L (ref 3.5–5.1)
Sodium: 136 mmol/L (ref 135–145)
Total Bilirubin: 1.2 mg/dL (ref 0.3–1.2)
Total Protein: 6.7 g/dL (ref 6.5–8.1)

## 2022-06-16 MED ORDER — MIDAZOLAM HCL 2 MG/2ML IJ SOLN
INTRAMUSCULAR | Status: AC | PRN
  Administered 2022-06-16 (×5): 1 mg via INTRAVENOUS

## 2022-06-16 MED ORDER — FENTANYL CITRATE (PF) 100 MCG/2ML IJ SOLN
INTRAMUSCULAR | Status: AC
  Filled 2022-06-16: qty 4

## 2022-06-16 MED ORDER — HYDROCODONE-ACETAMINOPHEN 5-325 MG PO TABS: 1.0000 | ORAL_TABLET | ORAL | Status: DC | PRN

## 2022-06-16 MED ORDER — FENTANYL CITRATE (PF) 100 MCG/2ML IJ SOLN
INTRAMUSCULAR | Status: AC | PRN
  Administered 2022-06-16 (×3): 50 ug via INTRAVENOUS

## 2022-06-16 MED ORDER — IOHEXOL 300 MG/ML  SOLN
60.0000 mL | Freq: Once | INTRAMUSCULAR | Status: AC | PRN
  Administered 2022-06-16: 60 mL via INTRAVENOUS

## 2022-06-16 MED ORDER — MIDAZOLAM HCL 2 MG/2ML IJ SOLN
INTRAMUSCULAR | Status: AC
  Filled 2022-06-16: qty 4

## 2022-06-16 MED ORDER — MIDAZOLAM HCL 2 MG/2ML IJ SOLN
INTRAMUSCULAR | Status: AC
  Filled 2022-06-16: qty 2

## 2022-06-16 MED ORDER — LIDOCAINE HCL (PF) 1 % IJ SOLN
INTRAMUSCULAR | Status: DC
Start: 2022-06-16 — End: 2022-06-16
  Filled 2022-06-16: qty 30

## 2022-06-16 MED ORDER — SODIUM CHLORIDE 0.9 % IV SOLN: INTRAVENOUS | Status: DC

## 2022-06-16 NOTE — Procedures (Signed)
  Procedure:  CT guided R liver lesion core biopsy 18g x4 Preprocedure diagnosis: Liver lesion, h/o colorectal CA Postprocedure diagnosis: same EBL:    minimal Complications:   none immediate  See full dictation in [facility].  Dillard Cannon MD Main # [phone] Pager  [phone] Mobile [phone]

## 2022-06-16 NOTE — Sedation Documentation (Signed)
Dr. Vernard Gambles unable to visualize lesion well with US guidance. Pt to be taken to CT.

## 2022-06-16 NOTE — H&P (Signed)
Chief Complaint: Patient was seen in consultation today for liver lesion biopsy at the request of Owens Shark  Referring Physician(s): Thomas,Lisa K Dr Darrold Junker  Supervising Physician: Daryll Brod  Patient Status: [facility] - Out-pt  History of Present Illness: Jeremy Stevenson is a 71 y.o. male   Known to IR Hx Colon Ca 2018 Liver lesion + metastasis 09/2018--- followed by microwave ablation with Dr Erin Fulling with Dr Benay Spice  PET 04/2022: IMPRESSION: 1. Focal activity in the posterior RIGHT hepatic lobe which corresponds to hypoenhancing lesion on comparison MRI is most consistent with recurrence of colorectal metastasis to the liver. 2. Postsurgical change of the LEFT colon without evidence of local recurrence. 3. No metastatic adenopathy.  Dr Benay Spice note 05/21/22:We reviewed the PET findings and images with Jeremy Stevenson and his wife.  He appears to have progressive disease involving the area of the previous ablation and SBRT.  The CEA is elevated.  There is no other PET evidence of metastatic disease  Was referred to Dr Ayesha Rumpf for possible resection Scheduled first in IR for biopsy of this lesion---today per Dr Benay Spice  Past Medical History:  Diagnosis Date   Colon cancer [facility])    Depression    Genetic testing 03/16/2017   Jeremy Stevenson underwent genetic counseling and testing for hereditary cancer syndromes on 03/03/2017. His results were negative for mutations in all 46 genes analyzed by [employer]'s Common Hereditary Cancers Panel. 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 01/20/2017   Peripheral neuropathy     Past Surgical History:  Procedure Laterality Date   BIOPSY  05/03/2020   Procedure: BIOPSY;  Surgeon: Rogene Houston, MD;  Location: [facility];  Service: Endoscopy;;   COLONOSCOPY N/A 01/02/2017   Procedure: COLONOSCOPY;  Surgeon: Rogene Houston, MD;  Location: [facility];  Service: Endoscopy;  Laterality: N/A;  1000   COLONOSCOPY N/A 08/07/2017   Procedure: COLONOSCOPY;  Surgeon: Rogene Houston, MD;  Location: [facility];  Service: Endoscopy;  Laterality: N/A;  1045 - per Hurshel Keys of new time   COLONOSCOPY N/A 05/03/2020   Procedure: COLONOSCOPY;  Surgeon: Rogene Houston, MD;  Location: [facility];  Service: Endoscopy;  Laterality: N/A;  0830   HERNIA REPAIR     71 years old   IR IMAGING GUIDED PORT INSERTION  10/13/2018   IR RADIOLOGIST EVAL & MGMT  09/07/2018   IR RADIOLOGIST EVAL & MGMT  11/03/2018   IR RADIOLOGIST EVAL & MGMT  10/25/2019   IR RADIOLOGIST EVAL & MGMT  03/29/2020   IR RADIOLOGIST EVAL & MGMT  12/25/2020   IR REMOVAL TUN ACCESS W/ PORT W/O FL MOD SED  05/26/2019   LAPAROSCOPIC SIGMOID COLECTOMY N/A 01/20/2017   Procedure: LAPAROSCOPIC LEFT COLECTOMY, TAKEDOWN SPLENIC FLEXURE, AND BIOPSY OF PERITONEAL NODULE;  Surgeon: Fanny Skates, MD;  Location: [facility];  Service: General;  Laterality: N/A;   POLYPECTOMY  05/03/2020   Procedure: POLYPECTOMY;  Surgeon: Rogene Houston, MD;  Location: [facility];  Service: Endoscopy;;   RADIOLOGY WITH ANESTHESIA N/A 09/29/2018   Procedure: CT WITH ANESTHESIA/MICROWAVE THERMAL ABLATION OF LIVER, BIOPSY;  Surgeon: Corrie Mckusick, DO;  Location: [facility] OR[facility];  Service: Anesthesiology;  Laterality: N/A;   TONSILLECTOMY     71  years old    Allergies: Codeine  Medications: Prior to Admission medications   Medication Sig Start Date End Date Taking? Authorizing Provider  ALPRAZolam Duanne Moron) 0.25 MG tablet ALPRAZolam   Yes [provider]  amLODipine (NORVASC) 5 MG tablet Take 5 mg by mouth daily.  10/05/16  Yes [provider]  chlorthalidone (HYGROTON) 25 MG tablet Take 25 mg by mouth daily.    Yes [provider]  citalopram (CELEXA) 20 MG tablet  Take 20 mg by mouth daily.  12/06/16  Yes [provider]  losartan (COZAAR) 100 MG tablet Take 100 mg by mouth daily.  11/29/16  Yes [provider]  metoprolol tartrate (LOPRESSOR) 100 MG tablet Take 1 tablet (100 mg total) by mouth 2 (two) times daily. 04/26/19  Yes Ladell Pier, MD  mirabegron ER (MYRBETRIQ) 25 MG TB24 tablet Take 1 tablet (25 mg total) by mouth daily. 06/12/22  Yes Stoneking, Reece Leader., MD     Family History  Problem Relation Age of Onset   Breast cancer Mother 34   Skin cancer Mother    Ovarian cancer Sister 51       d.50 due to metastases   Skin cancer Maternal Uncle    Skin cancer Cousin 15   Skin cancer Cousin 60    Social History   Socioeconomic History   Marital status: Married    Spouse name: Not on file   Number of children: Not on file   Years of education: Not on file   Highest education level: Not on file  Occupational History   Not on file  Tobacco Use   Smoking status: Never   Smokeless tobacco: Never  Vaping Use   Vaping Use: Never used  Substance and Sexual Activity   Alcohol use: No   Drug use: No   Sexual activity: Yes  Other Topics Concern   Not on file  Social History Narrative   Not on file   Social Determinants of Health   Financial Resource Strain: Not on file  Food Insecurity: Not on file  Transportation Needs: Not on file  Physical Activity: Not on file  Stress: Not on file  Social Connections: Not on file   Review of Systems: A 12 point ROS discussed and pertinent positives are indicated in the HPI above.  All other systems are negative.  Review of Systems  Constitutional:  Negative for activity change, fatigue, fever and unexpected weight change.  Respiratory:  Negative for cough and shortness of breath.   Cardiovascular:  Negative for chest pain.  Gastrointestinal:  Negative for abdominal pain.  Neurological:  Negative for weakness.  Psychiatric/Behavioral:  Negative for behavioral problems  and confusion.     Vital Signs: BP (!) 153/96   Pulse 74   Temp 97.8 F (36.6 C)   Ht $R'6\' 2"'WK$  (1.88 m)   Wt 275 lb (124.7 kg)   SpO2 97%   BMI 35.31 kg/m     Physical Exam Vitals reviewed.  HENT:     Mouth/Throat:     Mouth: Mucous membranes are moist.  Cardiovascular:     Rate and Rhythm: Normal rate and regular rhythm.     Heart sounds: Normal heart sounds.  Pulmonary:     Breath sounds: Normal breath sounds.  Abdominal:     Palpations: Abdomen is soft.     Tenderness: There is no abdominal tenderness.  Musculoskeletal:        General: Normal range of motion.  Skin:  General: Skin is warm.  Neurological:     Mental Status: He is alert and oriented to person, place, and time.  Psychiatric:        Behavior: Behavior normal.     Imaging: No results found.  Labs:  CBC: No results for input(s): "WBC", "HGB", "HCT", "PLT" in the last 8760 hours.  COAGS: No results for input(s): "INR", "APTT" in the last 8760 hours.  BMP: No results for input(s): "NA", "K", "CL", "CO2", "GLUCOSE", "BUN", "CALCIUM", "CREATININE", "GFRNONAA", "GFRAA" in the last 8760 hours.  Invalid input(s): "CMP"  LIVER FUNCTION TESTS: No results for input(s): "BILITOT", "AST", "ALT", "ALKPHOS", "PROT", "ALBUMIN" in the last 8760 hours.  TUMOR MARKERS: Recent Labs    02/19/22 1114 03/19/22 1203 04/21/22 0938 05/21/22 1011  CEA 13.28* 18.55* 36.20* 70.21*    Assessment and Plan:  Known colon cancer 2018 +liver lesion and MWA in IR 09/2018 +PET 04/2022 in follow up Possible liver lesion recurrence; considering resection Scheduled for biopsy today Risks and benefits of liver lesion biopsy was discussed with the patient and/or patient's family including, but not limited to bleeding, infection, damage to adjacent structures or low yield requiring additional tests.  All of the questions were answered and there is agreement to proceed. Consent signed and in chart.   Thank you for  this interesting consult.  I greatly enjoyed meeting Jeremy Stevenson and look forward to participating in their care.  A copy of this report was sent to the requesting provider on this date.  Electronically Signed: Lavonia Drafts, PA-C 06/16/2022, 9:14 AM   I spent a total of    25 Minutes in face to face in clinical consultation, greater than 50% of which was counseling/coordinating care for liver lesion biopsy

## 2022-06-16 NOTE — Sedation Documentation (Signed)
CT imaging complete. Dr. Vernard Gambles paged.

## 2022-06-16 NOTE — Progress Notes (Signed)
Attempted to get report X2.

## 2022-06-16 NOTE — Sedation Documentation (Signed)
Attempted to call SBAR to SS. Will call back.

## 2022-06-16 NOTE — Progress Notes (Signed)
Patient and wife was given discharge instructions. Both verbalized understanding. 

## 2022-06-17 ENCOUNTER — Telehealth: Payer: Self-pay

## 2022-06-17 LAB — URINE CULTURE

## 2022-06-17 LAB — SURGICAL PATHOLOGY

## 2022-06-17 MED ORDER — SULFAMETHOXAZOLE-TRIMETHOPRIM 800-160 MG PO TABS: 1.0000 | ORAL_TABLET | Freq: Two times a day (BID) | ORAL | 0 refills | Status: AC

## 2022-06-17 NOTE — Telephone Encounter (Signed)
-----   Message from Primus Bravo, MD sent at 06/17/2022  8:43 AM EDT ----- Please notify patient that his urine culture showed evidence of a UTI.  A rx for Bactrim DS x 7days was sent in for treatment.

## 2022-06-17 NOTE — Addendum Note (Signed)
Addended by: Primus Bravo on: 06/17/2022 08:44 AM   Modules accepted: Orders

## 2022-06-17 NOTE — Telephone Encounter (Signed)
Tried to call patient several times with no answer and no way to leave a voicemail.  Will try again at a later time.

## 2022-06-18 ENCOUNTER — Encounter: Payer: Self-pay | Admitting: Nurse Practitioner

## 2022-06-18 ENCOUNTER — Telehealth: Payer: Self-pay

## 2022-06-18 ENCOUNTER — Inpatient Hospital Stay: Payer: Medicare Other | Attending: Oncology | Admitting: Nurse Practitioner

## 2022-06-18 VITALS — BP 134/80 | HR 80 | Temp 98.1°F | Resp 20 | Ht 74.0 in | Wt 266.0 lb

## 2022-06-18 DIAGNOSIS — D6959 Other secondary thrombocytopenia: Secondary | ICD-10-CM | POA: Insufficient documentation

## 2022-06-18 DIAGNOSIS — T451X5A Adverse effect of antineoplastic and immunosuppressive drugs, initial encounter: Secondary | ICD-10-CM | POA: Diagnosis not present

## 2022-06-18 DIAGNOSIS — C787 Secondary malignant neoplasm of liver and intrahepatic bile duct: Secondary | ICD-10-CM | POA: Insufficient documentation

## 2022-06-18 DIAGNOSIS — C186 Malignant neoplasm of descending colon: Secondary | ICD-10-CM | POA: Insufficient documentation

## 2022-06-18 NOTE — Telephone Encounter (Signed)
-----   Message from Primus Bravo, MD sent at 06/17/2022  8:43 AM EDT ----- Please notify patient that his urine culture showed evidence of a UTI.  A rx for Bactrim DS x 7days was sent in for treatment.

## 2022-06-18 NOTE — Progress Notes (Signed)
[place] OFFICE PROGRESS NOTE   Diagnosis: Colon cancer  INTERVAL HISTORY:   Mr. Martinique returns as scheduled.  He underwent biopsy of the liver lesion 06/16/2022.  Overall he is feeling well.  No abdominal pain.  Good appetite.  Objective:  Vital signs in last 24 hours:  Blood pressure 134/80, pulse 80, temperature 98.1 F (36.7 C), temperature source Oral, resp. rate 20, height '6\' 2"'  (1.88 m), weight 266 lb (120.7 kg), SpO2 98 %.    Resp: Lungs clear bilaterally. Cardio: Regular rate and rhythm. GI: Abdomen soft and nontender.  No hepatomegaly. Vascular: No leg edema.  Lab Results:  Lab Results  Component Value Date   WBC 11.3 (H) 06/16/2022   HGB 12.9 (L) 06/16/2022   HCT 38.3 (L) 06/16/2022   MCV 85.9 06/16/2022   PLT 157 06/16/2022   NEUTROABS 8.7 (H) 06/16/2022    Imaging:  CT LIVER MASS BIOPSY  Result Date: 06/16/2022 CLINICAL DATA:  History of colon carcinoma. Hypermetabolic right lobe liver lesion on recent PET-CT. EXAM: CT GUIDED CORE BIOPSY OF LIVER LESION ANESTHESIA/SEDATION: Intravenous Fentanyl 149mg and Versed 533mwere administered as conscious sedation during continuous monitoring of the patient's level of consciousness and physiological / cardiorespiratory status by the radiology RN, with a total moderate sedation time of 60 minutes. PROCEDURE: The procedure risks, benefits, and alternatives were explained to the patient. Questions regarding the procedure were encouraged and answered. The patient understands and consents to the procedure. initially, the sonographer and I separately interrogated the liver with ultrasound in hopes of using ultrasound guidance for biopsy. However, although we could visualize the biliary ductal dilatation and regional parenchymal atrophy, the lesion could not be discretely identified and therefore patient was transferred to CT. Limited axial scans through the liver were obtained. Lesion was localized and an  appropriate skin entry site was determined and marked. The operative field was prepped with chlorhexidinein a sterile fashion, and a sterile drape was applied covering the operative field. A sterile gown and sterile gloves were used for the procedure. Local anesthesia was provided with 1% Lidocaine. Under CT fluoroscopic guidance, a 17 gauge trocar needle was advanced to the margin of the lesion. 50 mL iodinated contrast was administered during localization to confirm the lesion which was fairly low contrast. This was also correlated with recent PET-CT. Once needle tip position was confirmed, coaxial 18-gauge core biopsy samples were obtained, submitted in formalin to surgical pathology. The guide needle was removed. Postprocedure scans show no hemorrhage or other apparent complication. The patient tolerated the procedure well. COMPLICATIONS: None immediate FINDINGS: The Hepatic segment 7 hypermetabolic lesion was localized. Representative core biopsy samples were obtained as above. IMPRESSION: 1. Technically successful percutaneous core biopsy of segment 7 liver lesion under CT guidance. RADIATION DOSE REDUCTION: This exam was performed according to the departmental dose-optimization program which includes automated exposure control, adjustment of the mA and/or kV according to patient size and/or use of iterative reconstruction technique. Electronically Signed   By: D Lucrezia Europe.D.   On: 06/16/2022 14:52    Medications: I have reviewed the patient's current medications.  Assessment/Plan: Adenocarcinoma of the descending colon, stage II (T3 N0), status post a left colectomy 01/20/2017 MSI-stable, no loss of mismatch repair protein expression Elevated preoperative CEA Incomplete preoperative colonoscopy Colonoscopy 08/07/2017-multiple polyps removed including tubular adenomas CT abdomen/pelvis 02/05/2018-no evidence of metastatic disease. Elevated CEA June 2019 CTs 06/29/2018-no evidence of metastatic  disease, stable mild bilateral iliac adenopathy PET scan 08/03/2018- solitary hypermetabolic  central right liver metastasis; asymmetric hypermetabolism right palatine tonsil with associated mild asymmetric soft tissue fullness on CT images MRI liver 08/25/2018- 2.6 cm mass in the central right liver consistent with metastasis, no other evidence of metastatic disease Ablation and biopsy of right liver lesion 09/29/2018-pathology revealed adenocarcinoma; foundation 1-KRAS G12V, NRAS wildtype, microsatellite stable, tumor mutational burden 1 Cycle 1 FOLFOX 10/25/2018 Cycle 2 FOLFOX 11/09/2018 Cycle 3 FOLFOX 11/23/2018 Cycle 4 FOLFOX 12/07/2018 Cycle 5 FOLFOX 12/21/2018 (oxaliplatin dose reduced secondary to thrombocytopenia) Cycle 6 FOLFOX 01/04/2019 Cycle 7 FOLFOX 01/18/2019 Cycle 8 FOLFOX 02/01/2019 Cycle 9 FOLFOX 02/15/2019 MRI abdomen 02/24/2019- ablation site in the liver without residual tumor identified.  Mild intrahepatic biliary dilatation distal to the ablation site.  Diffuse hepatic steatosis.  No new liver lesions identified. Cycle 10 FOLFOX 02/28/2019 (oxaliplatin held due to neuropathy and thrombocytopenia) Cycle 11 FOLFOX 03/14/2019 (oxaliplatin eliminated from the regimen due to neuropathy, 5-fluorouracil dose adjusted due to diarrhea and tearing) Cycle 12 FOLFOX 03/28/2019 (no oxaliplatin, 5-fluorouracil as per treatment 03/14/2019) CTs 09/08/2019-no evidence of recurrent disease, no evidence of recurrent disease at the hepatic ablation site, stable renal cysts CT abdomen/pelvis 03/06/2020-stable ablation site, no evidence of disease progression, stable gastric lipoma Colonoscopy 05/03/2020-polyp removed from the cecum-hyperplastic polyp Elevated CEA 09/06/2020 CTs 09/06/2020-no evidence of recurrent disease, stable ablation change in the right liver Further elevation of CEA 11/12/2020 PET 11/22/2020- small focus of hypermetabolism in the medial right liver at the previous ablation site, no other  evidence of metastatic disease MRI liver 12/17/2020-interval contraction of the ablation site within the right hepatic lobe.  No new enhancing lesion to localize recurrence in the right hepatic lobe.  Subtle new duct dilatation in the right hepatic lobe which leads back to the same central region of increased metabolic activity on comparison FDG PET scan.  Combination of findings concerning for recurrent disease in the right hepatic lobe at the previous ablation site. Biopsy right liver mass near the site of prior ablation 02/14/2021-liver parenchyma with nonspecific changes and fragments of necrotic tissue SBRT 03/05/2021, 03/07/2021, 03/11/2021, 03/13/2021, 03/18/2021 MRI liver 06/03/2021-similar appearance of a 2.1 x 1.2 cm oval-shaped hypoenhancing lesion medially in the posterior right hepatic lobe associated with downstream biliary dilatation; this is the site of prior microwave ablation.  No definite abnormal enhancement.  No significant new liver lesions identified.  0.7 cm cystic lesion in the pancreatic head, possibly a postinflammatory lesion or small intraductal papillary mucinous neoplasm. MRI liver 01/14/2022-unchanged hypoenhancing lesion of the posterior right lobe of the liver measuring 2.0 x 1.3 cm, segmental biliary ductal dilatation posteriorly and inferiorly to the lesion with hyperemia of the liver parenchyma.  Findings consistent with ablated liver metastasis without MR evidence of residual disease.  No new liver lesions.  Hepatic steatosis.  Splenomegaly.  Unchanged 0.7 cm fluid signal cystic lesion of the ventral pancreatic uncinate. PET scan 05/14/2022-focal activity posterior right hepatic lobe which corresponds to hypoenhancing lesion on comparison MRI Biopsy liver lesion 06/16/2022-benign liver parenchyma with nonspecific inflammatory and fibrotic changes, no evidence of malignancy.  The fibrotic inflammatory changes are felt to most likely represent nonspecific changes next to a mass  lesion.   2.  Enlargement of the right testicle-hydrocele-Testicular ultrasound 02/26/2017-negative for mass, small bilateral hydroceles   3.  Hypertension   4.  Depression   5.  Family history of breast and ovarian cancer, negative genetic testing   6.  History of left leg edema, pain.   7.  Pigmented lesion right lower leg-refer  to dermatology 06/30/2018   8.  Port-A-Cath placement 10/13/2018   9.  Thrombocytopenia secondary to chemotherapy   10.  Oxaliplatin neuropathy-trial of gabapentin 12/30/2019  Disposition: Mr. Martinique appears stable.  We reviewed the pathology report from the liver biopsy with him and his wife at today's visit.  They understand there was no evidence of malignancy but there is the possibility of a sampling error.  We will communicate these findings to Dr. Zenia Resides.  We scheduled a return visit in 3 weeks, sooner if needed.  Patient seen with Dr. Benay Spice.    [name] ANP/GNP-BC   06/18/2022  11:21 AM  This was a shared visit with [name].  We reviewed the liver biopsy pathology report with Mr. Martinique.  We discussed management options.  I will review the indication for a repeat biopsy with Dr. Zenia Resides.  Mr. Martinique understands it is very likely the liver lesion represents progression of metastatic colon cancer.  I was present greater than 50% of today's visit.  I performed medical decision making.  Julieanne Manson, MD

## 2022-06-18 NOTE — Telephone Encounter (Signed)
Attempt to call patient and he has a voicemail box that is not setup yet

## 2022-06-19 NOTE — Telephone Encounter (Signed)
Called patient with no answer and unable to leave message.

## 2022-06-20 NOTE — Telephone Encounter (Signed)
Called patient with no answer and unable to leave message.

## 2022-06-23 DIAGNOSIS — N400 Enlarged prostate without lower urinary tract symptoms: Secondary | ICD-10-CM | POA: Diagnosis not present

## 2022-06-23 DIAGNOSIS — I10 Essential (primary) hypertension: Secondary | ICD-10-CM | POA: Diagnosis not present

## 2022-06-23 DIAGNOSIS — C787 Secondary malignant neoplasm of liver and intrahepatic bile duct: Secondary | ICD-10-CM | POA: Diagnosis not present

## 2022-06-23 DIAGNOSIS — Z299 Encounter for prophylactic measures, unspecified: Secondary | ICD-10-CM | POA: Diagnosis not present

## 2022-06-23 DIAGNOSIS — C189 Malignant neoplasm of colon, unspecified: Secondary | ICD-10-CM | POA: Diagnosis not present

## 2022-06-23 DIAGNOSIS — Z6834 Body mass index (BMI) 34.0-34.9, adult: Secondary | ICD-10-CM | POA: Diagnosis not present

## 2022-06-24 ENCOUNTER — Other Ambulatory Visit (HOSPITAL_COMMUNITY): Payer: Self-pay | Admitting: Surgery

## 2022-06-24 ENCOUNTER — Encounter: Payer: Self-pay | Admitting: Oncology

## 2022-06-24 ENCOUNTER — Other Ambulatory Visit: Payer: Self-pay | Admitting: Surgery

## 2022-06-24 DIAGNOSIS — C189 Malignant neoplasm of colon, unspecified: Secondary | ICD-10-CM

## 2022-06-25 ENCOUNTER — Ambulatory Visit (HOSPITAL_COMMUNITY)
Admission: RE | Admit: 2022-06-25 | Discharge: 2022-06-25 | Disposition: A | Discharge status: Home / Self Care | Payer: Medicare Other | Source: Ambulatory Visit | Attending: Surgery | Admitting: Surgery

## 2022-06-25 DIAGNOSIS — C189 Malignant neoplasm of colon, unspecified: Secondary | ICD-10-CM | POA: Diagnosis not present

## 2022-06-25 DIAGNOSIS — K5730 Diverticulosis of large intestine without perforation or abscess without bleeding: Secondary | ICD-10-CM | POA: Diagnosis not present

## 2022-06-25 DIAGNOSIS — N281 Cyst of kidney, acquired: Secondary | ICD-10-CM | POA: Diagnosis not present

## 2022-06-25 DIAGNOSIS — C787 Secondary malignant neoplasm of liver and intrahepatic bile duct: Secondary | ICD-10-CM | POA: Insufficient documentation

## 2022-06-25 DIAGNOSIS — M419 Scoliosis, unspecified: Secondary | ICD-10-CM | POA: Diagnosis not present

## 2022-06-25 MED ORDER — GADOBUTROL 1 MMOL/ML IV SOLN
10.0000 mL | Freq: Once | INTRAVENOUS | Status: AC | PRN
  Administered 2022-06-25: 10 mL via INTRAVENOUS

## 2022-06-25 NOTE — Telephone Encounter (Signed)
Letter mailed

## 2022-07-04 ENCOUNTER — Inpatient Hospital Stay: Payer: Medicare Other | Admitting: Nurse Practitioner

## 2022-07-04 ENCOUNTER — Encounter: Payer: Self-pay | Admitting: Nurse Practitioner

## 2022-07-04 ENCOUNTER — Inpatient Hospital Stay (HOSPITAL_BASED_OUTPATIENT_CLINIC_OR_DEPARTMENT_OTHER): Payer: Medicare Other | Admitting: Nurse Practitioner

## 2022-07-04 VITALS — BP 160/81 | HR 77 | Temp 98.1°F | Resp 18 | Ht 74.0 in | Wt 269.0 lb

## 2022-07-04 DIAGNOSIS — T451X5A Adverse effect of antineoplastic and immunosuppressive drugs, initial encounter: Secondary | ICD-10-CM | POA: Diagnosis not present

## 2022-07-04 DIAGNOSIS — C787 Secondary malignant neoplasm of liver and intrahepatic bile duct: Secondary | ICD-10-CM | POA: Diagnosis not present

## 2022-07-04 DIAGNOSIS — D6959 Other secondary thrombocytopenia: Secondary | ICD-10-CM | POA: Diagnosis not present

## 2022-07-04 DIAGNOSIS — C186 Malignant neoplasm of descending colon: Secondary | ICD-10-CM

## 2022-07-04 NOTE — Progress Notes (Signed)
[place] OFFICE PROGRESS NOTE   Diagnosis: Colon cancer  INTERVAL HISTORY:   Jeremy Stevenson returns as scheduled.  He overall feels well.  He has a good appetite.  No pain.  Objective:  Vital signs in last 24 hours:  Blood pressure (!) 160/81, pulse 77, temperature 98.1 F (36.7 C), temperature source Oral, resp. rate 18, height '6\' 2"'  (1.88 m), weight 269 lb (122 kg), SpO2 100 %.    Resp: Lungs clear bilaterally. Cardio: Regular rate and rhythm. GI: Abdomen soft and nontender.  No hepatomegaly. Vascular: No leg edema.  Lab Results:  Lab Results  Component Value Date   WBC 11.3 (H) 06/16/2022   HGB 12.9 (L) 06/16/2022   HCT 38.3 (L) 06/16/2022   MCV 85.9 06/16/2022   PLT 157 06/16/2022   NEUTROABS 8.7 (H) 06/16/2022    Imaging:  No results found.  Medications: I have reviewed the patient's current medications.  Assessment/Plan: Adenocarcinoma of the descending colon, stage II (T3 N0), status post a left colectomy 01/20/2017 MSI-stable, no loss of mismatch repair protein expression Elevated preoperative CEA Incomplete preoperative colonoscopy Colonoscopy 08/07/2017-multiple polyps removed including tubular adenomas CT abdomen/pelvis 02/05/2018-no evidence of metastatic disease. Elevated CEA June 2019 CTs 06/29/2018-no evidence of metastatic disease, stable mild bilateral iliac adenopathy PET scan 08/03/2018- solitary hypermetabolic central right liver metastasis; asymmetric hypermetabolism right palatine tonsil with associated mild asymmetric soft tissue fullness on CT images MRI liver 08/25/2018- 2.6 cm mass in the central right liver consistent with metastasis, no other evidence of metastatic disease Ablation and biopsy of right liver lesion 09/29/2018-pathology revealed adenocarcinoma; foundation 1-KRAS G12V, NRAS wildtype, microsatellite stable, tumor mutational burden 1 Cycle 1 FOLFOX 10/25/2018 Cycle 2 FOLFOX 11/09/2018 Cycle 3 FOLFOX  11/23/2018 Cycle 4 FOLFOX 12/07/2018 Cycle 5 FOLFOX 12/21/2018 (oxaliplatin dose reduced secondary to thrombocytopenia) Cycle 6 FOLFOX 01/04/2019 Cycle 7 FOLFOX 01/18/2019 Cycle 8 FOLFOX 02/01/2019 Cycle 9 FOLFOX 02/15/2019 MRI abdomen 02/24/2019- ablation site in the liver without residual tumor identified.  Mild intrahepatic biliary dilatation distal to the ablation site.  Diffuse hepatic steatosis.  No new liver lesions identified. Cycle 10 FOLFOX 02/28/2019 (oxaliplatin held due to neuropathy and thrombocytopenia) Cycle 11 FOLFOX 03/14/2019 (oxaliplatin eliminated from the regimen due to neuropathy, 5-fluorouracil dose adjusted due to diarrhea and tearing) Cycle 12 FOLFOX 03/28/2019 (no oxaliplatin, 5-fluorouracil as per treatment 03/14/2019) CTs 09/08/2019-no evidence of recurrent disease, no evidence of recurrent disease at the hepatic ablation site, stable renal cysts CT abdomen/pelvis 03/06/2020-stable ablation site, no evidence of disease progression, stable gastric lipoma Colonoscopy 05/03/2020-polyp removed from the cecum-hyperplastic polyp Elevated CEA 09/06/2020 CTs 09/06/2020-no evidence of recurrent disease, stable ablation change in the right liver Further elevation of CEA 11/12/2020 PET 11/22/2020- small focus of hypermetabolism in the medial right liver at the previous ablation site, no other evidence of metastatic disease MRI liver 12/17/2020-interval contraction of the ablation site within the right hepatic lobe.  No new enhancing lesion to localize recurrence in the right hepatic lobe.  Subtle new duct dilatation in the right hepatic lobe which leads back to the same central region of increased metabolic activity on comparison FDG PET scan.  Combination of findings concerning for recurrent disease in the right hepatic lobe at the previous ablation site. Biopsy right liver mass near the site of prior ablation 02/14/2021-liver parenchyma with nonspecific changes and fragments of necrotic tissue SBRT  03/05/2021, 03/07/2021, 03/11/2021, 03/13/2021, 03/18/2021 MRI liver 06/03/2021-similar appearance of a 2.1 x 1.2 cm oval-shaped hypoenhancing lesion medially in the posterior  right hepatic lobe associated with downstream biliary dilatation; this is the site of prior microwave ablation.  No definite abnormal enhancement.  No significant new liver lesions identified.  0.7 cm cystic lesion in the pancreatic head, possibly a postinflammatory lesion or small intraductal papillary mucinous neoplasm. MRI liver 01/14/2022-unchanged hypoenhancing lesion of the posterior right lobe of the liver measuring 2.0 x 1.3 cm, segmental biliary ductal dilatation posteriorly and inferiorly to the lesion with hyperemia of the liver parenchyma.  Findings consistent with ablated liver metastasis without MR evidence of residual disease.  No new liver lesions.  Hepatic steatosis.  Splenomegaly.  Unchanged 0.7 cm fluid signal cystic lesion of the ventral pancreatic uncinate. PET scan 05/14/2022-focal activity posterior right hepatic lobe which corresponds to hypoenhancing lesion on comparison MRI Biopsy liver lesion 06/16/2022-benign liver parenchyma with nonspecific inflammatory and fibrotic changes, no evidence of malignancy.  The fibrotic inflammatory changes are felt to most likely represent nonspecific changes next to a mass lesion. MRI liver 06/25/2022-enhancing lesion posteriorly in the right hepatic lobe corresponding with the area of hypermetabolic activity on PET CT.  This lies posteromedial to a previous ablation defect and is consistent with local recurrence or new metastasis. Referred to Dr. Fayrene Helper at [facility], appointment scheduled 07/14/2022   2.  Enlargement of the right testicle-hydrocele-Testicular ultrasound 02/26/2017-negative for mass, small bilateral hydroceles   3.  Hypertension   4.  Depression   5.  Family history of breast and ovarian cancer, negative genetic testing   6.  History of left leg edema, pain.   7.   Pigmented lesion right lower leg-refer to dermatology 06/30/2018   8.  Port-A-Cath placement 10/13/2018   9.  Thrombocytopenia secondary to chemotherapy   10.  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  Recent MRI report/images reviewed with Jeremy Stevenson and his wife at today's visit.  Per Dr. Zenia Resides the liver lesion appears technically resectable but would require a right hepatectomy.  She recommended evaluation at a tertiary center.  He is scheduled to see Dr. Fayrene Helper at [facility] on 07/14/2022.  We scheduled follow-up here in 2 months but are available sooner pending Dr. Drema Dallas recommendation.  Patient seen with Dr. Benay Spice.    [name] ANP/GNP-BC   07/04/2022  9:40 AM This was a shared visit with [name].  We reviewed the MRI findings and images with Jeremy Stevenson and his wife.  He has been referred to Dr. Fayrene Helper to consider hepatic directed therapy.  We will see him in 2 months.  I am available to see him sooner as needed.  I was present for greater than 50% of today's visit.  I performed medical decision making.  Julieanne Manson, MD

## 2022-07-14 DIAGNOSIS — C189 Malignant neoplasm of colon, unspecified: Secondary | ICD-10-CM | POA: Diagnosis not present

## 2022-07-14 DIAGNOSIS — C787 Secondary malignant neoplasm of liver and intrahepatic bile duct: Secondary | ICD-10-CM | POA: Diagnosis not present

## 2022-07-16 DIAGNOSIS — C787 Secondary malignant neoplasm of liver and intrahepatic bile duct: Secondary | ICD-10-CM | POA: Diagnosis not present

## 2022-07-16 DIAGNOSIS — C189 Malignant neoplasm of colon, unspecified: Secondary | ICD-10-CM | POA: Diagnosis not present

## 2022-07-22 DIAGNOSIS — C189 Malignant neoplasm of colon, unspecified: Secondary | ICD-10-CM | POA: Diagnosis not present

## 2022-07-22 DIAGNOSIS — C787 Secondary malignant neoplasm of liver and intrahepatic bile duct: Secondary | ICD-10-CM | POA: Diagnosis not present

## 2022-08-11 ENCOUNTER — Other Ambulatory Visit: Payer: Self-pay | Admitting: Nurse Practitioner

## 2022-08-11 DIAGNOSIS — R9431 Abnormal electrocardiogram [ECG] [EKG]: Secondary | ICD-10-CM | POA: Diagnosis not present

## 2022-08-11 DIAGNOSIS — C186 Malignant neoplasm of descending colon: Secondary | ICD-10-CM

## 2022-08-11 DIAGNOSIS — C787 Secondary malignant neoplasm of liver and intrahepatic bile duct: Secondary | ICD-10-CM | POA: Diagnosis not present

## 2022-08-11 DIAGNOSIS — C189 Malignant neoplasm of colon, unspecified: Secondary | ICD-10-CM | POA: Diagnosis not present

## 2022-08-11 DIAGNOSIS — Z01818 Encounter for other preprocedural examination: Secondary | ICD-10-CM | POA: Diagnosis not present

## 2022-08-11 DIAGNOSIS — Z9049 Acquired absence of other specified parts of digestive tract: Secondary | ICD-10-CM | POA: Diagnosis not present

## 2022-08-12 ENCOUNTER — Other Ambulatory Visit: Payer: Medicare Other

## 2022-08-12 ENCOUNTER — Other Ambulatory Visit: Payer: Self-pay | Admitting: *Deleted

## 2022-08-12 DIAGNOSIS — C186 Malignant neoplasm of descending colon: Secondary | ICD-10-CM

## 2022-08-14 ENCOUNTER — Encounter (HOSPITAL_BASED_OUTPATIENT_CLINIC_OR_DEPARTMENT_OTHER): Payer: Self-pay

## 2022-08-14 ENCOUNTER — Inpatient Hospital Stay: Payer: Medicare Other | Attending: Oncology

## 2022-08-14 ENCOUNTER — Ambulatory Visit (HOSPITAL_BASED_OUTPATIENT_CLINIC_OR_DEPARTMENT_OTHER)
Admission: RE | Admit: 2022-08-14 | Discharge: 2022-08-14 | Disposition: A | Discharge status: Home / Self Care | Payer: Medicare Other | Source: Ambulatory Visit | Attending: Nurse Practitioner | Admitting: Nurse Practitioner

## 2022-08-14 DIAGNOSIS — C186 Malignant neoplasm of descending colon: Secondary | ICD-10-CM | POA: Insufficient documentation

## 2022-08-14 LAB — CMP (CANCER CENTER ONLY)
ALT: 18 U/L (ref 0–44)
AST: 21 U/L (ref 15–41)
Albumin: 4.2 g/dL (ref 3.5–5.0)
Alkaline Phosphatase: 99 U/L (ref 38–126)
Anion gap: 10 (ref 5–15)
BUN: 40 mg/dL — ABNORMAL HIGH (ref 8–23)
CO2: 31 mmol/L (ref 22–32)
Calcium: 10 mg/dL (ref 8.9–10.3)
Chloride: 88 mmol/L — ABNORMAL LOW (ref 98–111)
Creatinine: 1.48 mg/dL — ABNORMAL HIGH (ref 0.61–1.24)
GFR, Estimated: 51 mL/min — ABNORMAL LOW (ref >60)
Glucose, Bld: 193 mg/dL — ABNORMAL HIGH (ref 70–99)
Potassium: 3.1 mmol/L — ABNORMAL LOW (ref 3.5–5.1)
Sodium: 129 mmol/L — ABNORMAL LOW (ref 135–145)
Total Bilirubin: 0.7 mg/dL (ref 0.3–1.2)
Total Protein: 7.3 g/dL (ref 6.5–8.1)

## 2022-08-14 LAB — POCT I-STAT CREATININE: Creatinine, Ser: 1.7 mg/dL — ABNORMAL HIGH (ref 0.61–1.24)

## 2022-08-14 MED ORDER — IOHEXOL 300 MG/ML  SOLN
100.0000 mL | Freq: Once | INTRAMUSCULAR | Status: AC | PRN
  Administered 2022-08-14: 80 mL via INTRAVENOUS

## 2022-08-15 ENCOUNTER — Telehealth: Payer: Self-pay | Admitting: *Deleted

## 2022-08-15 DIAGNOSIS — C189 Malignant neoplasm of colon, unspecified: Secondary | ICD-10-CM | POA: Diagnosis not present

## 2022-08-15 DIAGNOSIS — C787 Secondary malignant neoplasm of liver and intrahepatic bile duct: Secondary | ICD-10-CM | POA: Diagnosis not present

## 2022-08-15 NOTE — Telephone Encounter (Signed)
Notified Jeremy Stevenson that creatinine is elevated and K+ is low. He needs to push oral fluids and f/u with PCP regarding K+ replacement. She reports that PCP saw labs and has increased his KCL to 40 meq/day.  CT report is not final yet and she will be called when results are seen and will have images and report sent to Dr. Fayrene Helper at [facility].

## 2022-08-15 NOTE — Telephone Encounter (Signed)
Informed Mrs. Martinique that per Dr. Benay Spice, CT looks good. No evidence of cancer noted. He and Dr. Fayrene Helper will discuss case early next week and Dr. Drema Dallas office will call.

## 2022-08-15 NOTE — Telephone Encounter (Signed)
-----   Message from Ladell Pier, MD sent at 08/14/2022  5:04 PM EDT ----- Please call patient, creatinine is slightly more elevated, potassium is low, push fluids since he received IV contrast today, forward chemistry panel to primary provider for potassium replacement, and BMP next visit

## 2022-08-16 ENCOUNTER — Ambulatory Visit (HOSPITAL_BASED_OUTPATIENT_CLINIC_OR_DEPARTMENT_OTHER): Payer: Medicare Other

## 2022-08-21 ENCOUNTER — Encounter: Payer: Self-pay | Admitting: Nurse Practitioner

## 2022-08-21 ENCOUNTER — Inpatient Hospital Stay: Payer: Medicare Other

## 2022-08-21 ENCOUNTER — Inpatient Hospital Stay: Payer: Medicare Other | Attending: Oncology | Admitting: Nurse Practitioner

## 2022-08-21 VITALS — BP 133/81 | HR 72 | Temp 98.1°F | Resp 18 | Ht 74.0 in | Wt 264.0 lb

## 2022-08-21 DIAGNOSIS — C787 Secondary malignant neoplasm of liver and intrahepatic bile duct: Secondary | ICD-10-CM | POA: Insufficient documentation

## 2022-08-21 DIAGNOSIS — E860 Dehydration: Secondary | ICD-10-CM | POA: Insufficient documentation

## 2022-08-21 DIAGNOSIS — C186 Malignant neoplasm of descending colon: Secondary | ICD-10-CM

## 2022-08-21 DIAGNOSIS — Z79899 Other long term (current) drug therapy: Secondary | ICD-10-CM | POA: Diagnosis not present

## 2022-08-21 DIAGNOSIS — Z5111 Encounter for antineoplastic chemotherapy: Secondary | ICD-10-CM | POA: Insufficient documentation

## 2022-08-21 DIAGNOSIS — Z5112 Encounter for antineoplastic immunotherapy: Secondary | ICD-10-CM | POA: Diagnosis not present

## 2022-08-21 LAB — CMP (CANCER CENTER ONLY)
ALT: 29 U/L (ref 0–44)
AST: 29 U/L (ref 15–41)
Albumin: 4.1 g/dL (ref 3.5–5.0)
Alkaline Phosphatase: 96 U/L (ref 38–126)
Anion gap: 9 (ref 5–15)
BUN: 29 mg/dL — ABNORMAL HIGH (ref 8–23)
CO2: 30 mmol/L (ref 22–32)
Calcium: 10 mg/dL (ref 8.9–10.3)
Chloride: 97 mmol/L — ABNORMAL LOW (ref 98–111)
Creatinine: 1.19 mg/dL (ref 0.61–1.24)
GFR, Estimated: >60 mL/min (ref >60)
Glucose, Bld: 230 mg/dL — ABNORMAL HIGH (ref 70–99)
Potassium: 3.5 mmol/L (ref 3.5–5.1)
Sodium: 136 mmol/L (ref 135–145)
Total Bilirubin: 0.6 mg/dL (ref 0.3–1.2)
Total Protein: 7.3 g/dL (ref 6.5–8.1)

## 2022-08-21 LAB — CBC WITH DIFFERENTIAL (CANCER CENTER ONLY)
Abs Immature Granulocytes: 0.1 10*3/uL — ABNORMAL HIGH (ref 0.00–0.07)
Basophils Absolute: 0.1 10*3/uL (ref 0.0–0.1)
Basophils Relative: 0 %
Eosinophils Absolute: 0.3 10*3/uL (ref 0.0–0.5)
Eosinophils Relative: 3 %
HCT: 37.8 % — ABNORMAL LOW (ref 39.0–52.0)
Hemoglobin: 12.6 g/dL — ABNORMAL LOW (ref 13.0–17.0)
Immature Granulocytes: 1 %
Lymphocytes Relative: 10 %
Lymphs Abs: 1.1 10*3/uL (ref 0.7–4.0)
MCH: 28.8 pg (ref 26.0–34.0)
MCHC: 33.3 g/dL (ref 30.0–36.0)
MCV: 86.5 fL (ref 80.0–100.0)
Monocytes Absolute: 0.8 10*3/uL (ref 0.1–1.0)
Monocytes Relative: 7 %
Neutro Abs: 9.2 10*3/uL — ABNORMAL HIGH (ref 1.7–7.7)
Neutrophils Relative %: 79 %
Platelet Count: 183 10*3/uL (ref 150–400)
RBC: 4.37 MIL/uL (ref 4.22–5.81)
RDW: 13.9 % (ref 11.5–15.5)
WBC Count: 11.6 10*3/uL — ABNORMAL HIGH (ref 4.0–10.5)
nRBC: 0 % (ref 0.0–0.2)

## 2022-08-21 LAB — CEA (ACCESS): CEA (CHCC): 103.67 ng/mL — ABNORMAL HIGH (ref 0.00–5.00)

## 2022-08-21 NOTE — Progress Notes (Signed)
DISCONTINUE ON PATHWAY REGIMEN - Colorectal     A cycle is every 14 days:     Oxaliplatin      Leucovorin      5-Fluorouracil      5-Fluorouracil   **Always confirm dose/schedule in your pharmacy ordering system**  REASON: Disease Progression PRIOR TREATMENT: [id number]: Postoperative [id number] To Complete a Total of 6 Months TREATMENT RESPONSE: Unable to Evaluate  START OFF PATHWAY REGIMEN - Colorectal   [id number]:FOLFIRI + Bevacizumab (Leucovorin IV D1 + Fluorouracil IV D1/CIV D1,2 + Irinotecan IV D1 + Bevacizumab IV D1) q14 Days:   A cycle is every 14 days:     Bevacizumab-xxxx      Irinotecan      Leucovorin      Fluorouracil      Fluorouracil   **Always confirm dose/schedule in your pharmacy ordering system**  Patient Characteristics: Distant Metastases, Resectable, Neoadjuvant Therapy Planned Tumor Location: Colon Therapeutic Status: Distant Metastases  Intent of Therapy: Curative Intent, Discussed with Patient

## 2022-08-21 NOTE — Progress Notes (Signed)
[place] OFFICE PROGRESS NOTE   Diagnosis: Colon cancer  INTERVAL HISTORY:   [name] returns as scheduled.  On 08/11/2022 the CEA returned higher at 234.  CTs 08/14/2022 showed a focal tumor recurrence in the posterior right hepatic lobe was not well demonstrated, no evidence of new hepatic metastasis, no evidence of metastatic disease in the chest.  He has no complaints.  Specifically no nausea or vomiting.  No diarrhea.  No pain.  He has a good appetite.  Objective:  Vital signs in last 24 hours:  Blood pressure 133/81, pulse 72, temperature 98.1 F (36.7 C), temperature source Oral, resp. rate 18, height '6\' 2"'  (1.88 m), weight 264 lb (119.7 kg), SpO2 98 %.    HEENT: No thrush or ulcers. Resp: Lungs clear bilaterally. Cardio: Regular rate and rhythm. GI: No hepatosplenomegaly. Vascular: No leg edema.  Bilateral lower leg varicosities. Neuro: Alert and oriented.   Lab Results:  Lab Results  Component Value Date   WBC 11.3 (H) 06/16/2022   HGB 12.9 (L) 06/16/2022   HCT 38.3 (L) 06/16/2022   MCV 85.9 06/16/2022   PLT 157 06/16/2022   NEUTROABS 8.7 (H) 06/16/2022    Imaging:  No results found.  Medications: I have reviewed the patient's current medications.  Assessment/Plan: Adenocarcinoma of the descending colon, stage II (T3 N0), status post a left colectomy 01/20/2017 MSI-stable, no loss of mismatch repair protein expression Elevated preoperative CEA Incomplete preoperative colonoscopy Colonoscopy 08/07/2017-multiple polyps removed including tubular adenomas CT abdomen/pelvis 02/05/2018-no evidence of metastatic disease. Elevated CEA June 2019 CTs 06/29/2018-no evidence of metastatic disease, stable mild bilateral iliac adenopathy PET scan 08/03/2018- solitary hypermetabolic central right liver metastasis; asymmetric hypermetabolism right palatine tonsil with associated mild asymmetric soft tissue fullness on CT images MRI liver 08/25/2018- 2.6  cm mass in the central right liver consistent with metastasis, no other evidence of metastatic disease Ablation and biopsy of right liver lesion 09/29/2018-pathology revealed adenocarcinoma; foundation 1-KRAS G12V, NRAS wildtype, microsatellite stable, tumor mutational burden 1 Cycle 1 FOLFOX 10/25/2018 Cycle 2 FOLFOX 11/09/2018 Cycle 3 FOLFOX 11/23/2018 Cycle 4 FOLFOX 12/07/2018 Cycle 5 FOLFOX 12/21/2018 (oxaliplatin dose reduced secondary to thrombocytopenia) Cycle 6 FOLFOX 01/04/2019 Cycle 7 FOLFOX 01/18/2019 Cycle 8 FOLFOX 02/01/2019 Cycle 9 FOLFOX 02/15/2019 MRI abdomen 02/24/2019- ablation site in the liver without residual tumor identified.  Mild intrahepatic biliary dilatation distal to the ablation site.  Diffuse hepatic steatosis.  No new liver lesions identified. Cycle 10 FOLFOX 02/28/2019 (oxaliplatin held due to neuropathy and thrombocytopenia) Cycle 11 FOLFOX 03/14/2019 (oxaliplatin eliminated from the regimen due to neuropathy, 5-fluorouracil dose adjusted due to diarrhea and tearing) Cycle 12 FOLFOX 03/28/2019 (no oxaliplatin, 5-fluorouracil as per treatment 03/14/2019) CTs 09/08/2019-no evidence of recurrent disease, no evidence of recurrent disease at the hepatic ablation site, stable renal cysts CT abdomen/pelvis 03/06/2020-stable ablation site, no evidence of disease progression, stable gastric lipoma Colonoscopy 05/03/2020-polyp removed from the cecum-hyperplastic polyp Elevated CEA 09/06/2020 CTs 09/06/2020-no evidence of recurrent disease, stable ablation change in the right liver Further elevation of CEA 11/12/2020 PET 11/22/2020- small focus of hypermetabolism in the medial right liver at the previous ablation site, no other evidence of metastatic disease MRI liver 12/17/2020-interval contraction of the ablation site within the right hepatic lobe.  No new enhancing lesion to localize recurrence in the right hepatic lobe.  Subtle new duct dilatation in the right hepatic lobe which leads back  to the same central region of increased metabolic activity on comparison FDG PET scan.  Combination  of findings concerning for recurrent disease in the right hepatic lobe at the previous ablation site. Biopsy right liver mass near the site of prior ablation 02/14/2021-liver parenchyma with nonspecific changes and fragments of necrotic tissue SBRT 03/05/2021, 03/07/2021, 03/11/2021, 03/13/2021, 03/18/2021 MRI liver 06/03/2021-similar appearance of a 2.1 x 1.2 cm oval-shaped hypoenhancing lesion medially in the posterior right hepatic lobe associated with downstream biliary dilatation; this is the site of prior microwave ablation.  No definite abnormal enhancement.  No significant new liver lesions identified.  0.7 cm cystic lesion in the pancreatic head, possibly a postinflammatory lesion or small intraductal papillary mucinous neoplasm. MRI liver 01/14/2022-unchanged hypoenhancing lesion of the posterior right lobe of the liver measuring 2.0 x 1.3 cm, segmental biliary ductal dilatation posteriorly and inferiorly to the lesion with hyperemia of the liver parenchyma.  Findings consistent with ablated liver metastasis without MR evidence of residual disease.  No new liver lesions.  Hepatic steatosis.  Splenomegaly.  Unchanged 0.7 cm fluid signal cystic lesion of the ventral pancreatic uncinate. PET scan 05/14/2022-focal activity posterior right hepatic lobe which corresponds to hypoenhancing lesion on comparison MRI Biopsy liver lesion 06/16/2022-benign liver parenchyma with nonspecific inflammatory and fibrotic changes, no evidence of malignancy.  The fibrotic inflammatory changes are felt to most likely represent nonspecific changes next to a mass lesion. MRI liver 06/25/2022-enhancing lesion posteriorly in the right hepatic lobe corresponding with the area of hypermetabolic activity on PET CT.  This lies posteromedial to a previous ablation defect and is consistent with local recurrence or new metastasis. Referred to  Dr. Fayrene Helper at [facility], appointment scheduled 07/14/2022 CEA 234 on 08/11/2022 at [facility] CTs 08/14/2022-focal tumor recurrence posterior right hepatic lobe not well demonstrated on CT imaging; no evidence of new hepatic metastasis; no metastatic disease in the chest.   2.  Enlargement of the right testicle-hydrocele-Testicular ultrasound 02/26/2017-negative for mass, small bilateral hydroceles   3.  Hypertension   4.  Depression   5.  Family history of breast and ovarian cancer, negative genetic testing   6.  History of left leg edema, pain.   7.  Pigmented lesion right lower leg-refer to dermatology 06/30/2018   8.  Port-A-Cath placement 10/13/2018   9.  Thrombocytopenia secondary to chemotherapy   10.  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 appears stable.  The CEA returned significantly higher when checked at [facility] on 08/11/2022.  Restaging CTs with no new or progressive disease, focal tumor recurrence posterior right hepatic lobe not well demonstrated.  Dr. Benay Spice has spoken with Dr. Fayrene Helper regarding the increase in the CEA and the CT results.  The recommendation is to proceed with systemic therapy prior to considering liver surgery.  We discussed FOLFIRI/bevacizumab.  We reviewed potential side effects associated with chemotherapy including bone marrow toxicity, nausea, hair loss.  We discussed the potential for early and late phase diarrhea with irinotecan.  We reviewed potential side effects associated with 5-fluorouracil including mouth sores, diarrhea, hand-foot syndrome, increased sensitivity to sun, skin hyperpigmentation, skin rash.  We discussed potential side effects associated with Avastin including bleeding, delayed wound healing, bowel perforation, increased risk of blood clots, proteinuria, hypertension, posterior reversible encephalopathy syndrome.  He agrees to proceed.  We will obtain new baseline labs in the office today.  We are  referring him for Port-A-Cath placement.  He will return for cycle 1 FOLFIRI/Avastin 09/01/2022.  We will see him prior to cycle 2 on 09/15/2022.  Patient seen with Dr. Benay Spice.  [name]  ANP/GNP-BC   08/21/2022  10:18 AM  This was a shared visit with [name].  [name] appears stable.  He was referred to Dr. Fayrene Helper to consider resection of the isolated hepatic metastasis.  This is a surgery with the potential for significant morbidity.  The CEA returned markedly elevated at [facility] on 08/11/2022.  Dr. Fayrene Helper is concerned [name] may have microscopic metastatic disease.  Restaging CTs on 08/14/2022 revealed no evidence of metastatic disease.  The right hepatic metastasis is not visualized on CT.  I discussed the case with Dr. Fayrene Helper on several occasions over the past week.  We recommend proceeding with 3 months of systemic therapy with restaging prior to surgery.  I recommend FOLFIRI/bevacizumab.  We reviewed potential toxicities associated with this regimen.  He agrees to proceed.  A chemotherapy plan was entered today.  I was present for greater than 50% of today's visit.  I performed medical decision making.  Julieanne Manson, MD

## 2022-08-22 ENCOUNTER — Other Ambulatory Visit: Payer: Self-pay

## 2022-08-22 ENCOUNTER — Other Ambulatory Visit (HOSPITAL_COMMUNITY): Payer: Self-pay | Admitting: Physician Assistant

## 2022-08-24 ENCOUNTER — Other Ambulatory Visit: Payer: Self-pay | Admitting: Oncology

## 2022-08-25 ENCOUNTER — Encounter (HOSPITAL_COMMUNITY): Payer: Self-pay

## 2022-08-25 ENCOUNTER — Ambulatory Visit (HOSPITAL_COMMUNITY)
Admission: RE | Admit: 2022-08-25 | Discharge: 2022-08-25 | Disposition: A | Discharge status: Home / Self Care | Payer: Medicare Other | Source: Ambulatory Visit | Attending: Nurse Practitioner | Admitting: Nurse Practitioner

## 2022-08-25 ENCOUNTER — Other Ambulatory Visit: Payer: Self-pay

## 2022-08-25 DIAGNOSIS — C186 Malignant neoplasm of descending colon: Secondary | ICD-10-CM

## 2022-08-25 DIAGNOSIS — C189 Malignant neoplasm of colon, unspecified: Secondary | ICD-10-CM | POA: Diagnosis not present

## 2022-08-25 DIAGNOSIS — Z452 Encounter for adjustment and management of vascular access device: Secondary | ICD-10-CM | POA: Diagnosis not present

## 2022-08-25 HISTORY — PX: IR IMAGING GUIDED PORT INSERTION: IMG5740

## 2022-08-25 MED ORDER — LIDOCAINE-EPINEPHRINE 1 %-1:100000 IJ SOLN
INTRAMUSCULAR | Status: AC
  Filled 2022-08-25: qty 1

## 2022-08-25 MED ORDER — HEPARIN SOD (PORK) LOCK FLUSH 100 UNIT/ML IV SOLN
INTRAVENOUS | Status: AC
  Administered 2022-08-25: 500 [IU] via INTRAVENOUS
  Filled 2022-08-25: qty 5

## 2022-08-25 MED ORDER — MIDAZOLAM HCL 2 MG/2ML IJ SOLN
INTRAMUSCULAR | Status: AC | PRN
  Administered 2022-08-25: 1 mg via INTRAVENOUS

## 2022-08-25 MED ORDER — LIDOCAINE HCL 1 % IJ SOLN
INTRAMUSCULAR | Status: AC | PRN
  Administered 2022-08-25: 10 mL

## 2022-08-25 MED ORDER — SODIUM CHLORIDE 0.9 % IV SOLN: INTRAVENOUS | Status: DC

## 2022-08-25 MED ORDER — FENTANYL CITRATE (PF) 100 MCG/2ML IJ SOLN
INTRAMUSCULAR | Status: AC | PRN
  Administered 2022-08-25: 50 ug via INTRAVENOUS

## 2022-08-25 MED ORDER — MIDAZOLAM HCL 2 MG/2ML IJ SOLN
INTRAMUSCULAR | Status: AC
  Filled 2022-08-25: qty 2

## 2022-08-25 MED ORDER — FENTANYL CITRATE (PF) 100 MCG/2ML IJ SOLN
INTRAMUSCULAR | Status: AC
  Filled 2022-08-25: qty 2

## 2022-08-25 NOTE — Procedures (Signed)
Interventional Radiology Procedure Note  Procedure: Port placement.  Indication: Colon Ca  Findings: Please refer to procedural dictation for full description.  Complications: None  EBL: < 10 mL  Anastazja Isaac, MD [phone]   

## 2022-08-25 NOTE — H&P (Signed)
Chief Complaint: Patient was seen in consultation today for colon cancer.   Referring Physician(s): Owens Shark  Supervising Physician: Mir, Sharen Heck  Patient Status: [facility] - Out-pt  History of Present Illness: Jeremy Stevenson is a 71 y.o. male with history of colon cancer s/p treamtent in 2019-2020 via R IJ Port-A-Cath placement by Dr. Kathlene Cote. His Port was ultimately removed at the completion of therapy.  He was recently found to have disease recurrence and now has plans for chemotherapy initiation.  He is in need of replacement of his durable venous access.  IR consulted for [facility] placement at the request of [name], NP.    Patient seen and assessed in [facility] Radiology.  He is NPO. He does take blood thinners.  He is aware of the goals of the procedure and is agreeable to proceed. He is familiar with Port placement and care as he had a Port in the recent past.  His wife will be available for care and support at home today.   Past Medical History:  Diagnosis Date   Colon cancer [facility])    Depression    Genetic testing 03/16/2017   [name] underwent genetic counseling and testing for hereditary cancer syndromes on 03/03/2017. His results were negative for mutations in all 46 genes analyzed by [employer]'s Common Hereditary Cancers Panel. 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 01/20/2017   Peripheral neuropathy     Past Surgical History:  Procedure Laterality Date   BIOPSY  05/03/2020   Procedure: BIOPSY;  Surgeon: Rogene Houston, MD;  Location: [facility];  Service: Endoscopy;;   COLONOSCOPY N/A 01/02/2017   Procedure: COLONOSCOPY;  Surgeon: Rogene Houston, MD;  Location: [facility];  Service: Endoscopy;  Laterality: N/A;  1000   COLONOSCOPY N/A 08/07/2017    Procedure: COLONOSCOPY;  Surgeon: Rogene Houston, MD;  Location: [facility];  Service: Endoscopy;  Laterality: N/A;  1045 - per Hurshel Keys of new time   COLONOSCOPY N/A 05/03/2020   Procedure: COLONOSCOPY;  Surgeon: Rogene Houston, MD;  Location: [facility];  Service: Endoscopy;  Laterality: N/A;  0830   HERNIA REPAIR     71 years old   IR IMAGING GUIDED PORT INSERTION  10/13/2018   IR RADIOLOGIST EVAL & MGMT  09/07/2018   IR RADIOLOGIST EVAL & MGMT  11/03/2018   IR RADIOLOGIST EVAL & MGMT  10/25/2019   IR RADIOLOGIST EVAL & MGMT  03/29/2020   IR RADIOLOGIST EVAL & MGMT  12/25/2020   IR REMOVAL TUN ACCESS W/ PORT W/O FL MOD SED  05/26/2019   LAPAROSCOPIC SIGMOID COLECTOMY N/A 01/20/2017   Procedure: LAPAROSCOPIC LEFT COLECTOMY, TAKEDOWN SPLENIC FLEXURE, AND BIOPSY OF PERITONEAL NODULE;  Surgeon: Fanny Skates, MD;  Location: [facility];  Service: General;  Laterality: N/A;   POLYPECTOMY  05/03/2020   Procedure: POLYPECTOMY;  Surgeon: Rogene Houston, MD;  Location: [facility];  Service: Endoscopy;;   RADIOLOGY WITH ANESTHESIA N/A 09/29/2018   Procedure: CT WITH ANESTHESIA/MICROWAVE THERMAL ABLATION OF LIVER, BIOPSY;  Surgeon: Corrie Mckusick, DO;  Location: [facility] OR[facility];  Service: Anesthesiology;  Laterality: N/A;   TONSILLECTOMY     71 years old    Allergies: Codeine  Medications: Prior to Admission medications   Medication Sig Start Date  End Date Taking? Authorizing Provider  amLODipine (NORVASC) 5 MG tablet Take 5 mg by mouth daily.  10/05/16  Yes [provider]  chlorthalidone (HYGROTON) 25 MG tablet Take 25 mg by mouth daily.    Yes [provider]  citalopram (CELEXA) 20 MG tablet Take 20 mg by mouth daily.  12/06/16  Yes [provider]  LORazepam (ATIVAN) 2 MG tablet Take 0.5 mg by mouth every 6 (six) hours as needed for anxiety.   Yes [provider]  losartan (COZAAR) 100 MG tablet Take 100 mg by mouth daily.  11/29/16  Yes [provider]   metoprolol tartrate (LOPRESSOR) 100 MG tablet Take 1 tablet (100 mg total) by mouth 2 (two) times daily. 04/26/19  Yes Ladell Pier, MD  mirabegron ER (MYRBETRIQ) 25 MG TB24 tablet Take 1 tablet (25 mg total) by mouth daily. 06/12/22  Yes Stoneking, Reece Leader., MD     Family History  Problem Relation Age of Onset   Breast cancer Mother 8   Skin cancer Mother    Ovarian cancer Sister 29       d.50 due to metastases   Skin cancer Maternal Uncle    Skin cancer Cousin 65   Skin cancer Cousin 80    Social History   Socioeconomic History   Marital status: Married    Spouse name: Not on file   Number of children: Not on file   Years of education: Not on file   Highest education level: Not on file  Occupational History   Not on file  Tobacco Use   Smoking status: Never   Smokeless tobacco: Never  Vaping Use   Vaping Use: Never used  Substance and Sexual Activity   Alcohol use: No   Drug use: No   Sexual activity: Yes  Other Topics Concern   Not on file  Social History Narrative   Not on file   Social Determinants of Health   Financial Resource Strain: Not on file  Food Insecurity: Not on file  Transportation Needs: Not on file  Physical Activity: Not on file  Stress: Not on file  Social Connections: Not on file     Review of Systems: A 12 point ROS discussed and pertinent positives are indicated in the HPI above.  All other systems are negative.  Review of Systems  Constitutional:  Negative for fatigue and fever.  Respiratory:  Negative for cough and shortness of breath.   Cardiovascular:  Negative for chest pain.  Gastrointestinal:  Negative for abdominal pain, diarrhea, nausea and vomiting.  Musculoskeletal:  Negative for back pain.  Psychiatric/Behavioral:  Negative for behavioral problems and confusion.     Vital Signs: BP (!) 159/89   Pulse 66   Temp 97.8 F (36.6 C) (Temporal)   Resp 18   Ht '6\' 2"'  (1.88 m)   Wt 265 lb (120.2 kg)   SpO2 99%   BMI  34.02 kg/m   Physical Exam Vitals and nursing note reviewed.  Constitutional:      General: He is not in acute distress.    Appearance: Normal appearance. He is not ill-appearing.  HENT:     Mouth/Throat:     Mouth: Mucous membranes are moist.     Pharynx: Oropharynx is clear.  Cardiovascular:     Rate and Rhythm: Normal rate and regular rhythm.  Pulmonary:     Effort: Pulmonary effort is normal.     Breath sounds: Normal breath sounds.  Abdominal:  General: Abdomen is flat.     Palpations: Abdomen is soft.  Skin:    General: Skin is warm and dry.  Neurological:     General: No focal deficit present.     Mental Status: He is alert and oriented to person, place, and time.  Psychiatric:        Mood and Affect: Mood normal.        Behavior: Behavior normal.        Thought Content: Thought content normal.        Judgment: Judgment normal.      MD Evaluation Airway: WNL Heart: WNL Abdomen: WNL Chest/ Lungs: WNL ASA  Classification: 3 Mallampati/Airway Score: Two   Imaging: CT CHEST ABDOMEN PELVIS W CONTRAST  Result Date: 08/15/2022 CLINICAL DATA:  Colorectal carcinoma. Stage IV carcinoma. Hepatic metastasis. * Tracking Code: BO * EXAM: CT CHEST, ABDOMEN, AND PELVIS WITH CONTRAST TECHNIQUE: Multidetector CT imaging of the chest, abdomen and pelvis was performed following the standard protocol during bolus administration of intravenous contrast. RADIATION DOSE REDUCTION: This exam was performed according to the departmental dose-optimization program which includes automated exposure control, adjustment of the mA and/or kV according to patient size and/or use of iterative reconstruction technique. CONTRAST:  70m OMNIPAQUE IOHEXOL 300 MG/ML  SOLN COMPARISON:  MRI 06/25/2022, PET CT 05/14/22 FINDINGS: CT CHEST FINDINGS Cardiovascular: No significant vascular findings. Normal heart size. No pericardial effusion. Mediastinum/Nodes: No axillary or supraclavicular adenopathy. No  mediastinal or hilar adenopathy. No pericardial fluid. Esophagus normal. Lungs/Pleura: Small lingular nodule measuring 6 mm unchanged. No new pulmonary nodules Musculoskeletal: No aggressive osseous lesion. CT ABDOMEN AND PELVIS FINDINGS Hepatobiliary: Focal tumor recurrence in the posterior RIGHT hepatic lobe demonstrated on comparison MRI PET-CT scan is not well demonstrated on CT imaging. There is mild duct dilatation segmental pattern peripheral to ablation defect not changed from prior. Recurrent lesion is suspected on image 63/3 measuring approximately 2.2 cm. No new foci of metastatic disease within the liver identified on CT imaging. Pancreas: Pancreas is normal. No ductal dilatation. No pancreatic inflammation. Spleen: Normal spleen Adrenals/urinary tract: No change in small LEFT adrenal myelolipoma. Extensive the renal cortical lesions unchanged. Ureters and bladder normal. Stomach/Bowel: Stomach, small bowel, appendix, and cecum are normal. Multiple diverticula of the descending colon and sigmoid colon without acute inflammation. No colonic obstruction or mass identified. Vascular/Lymphatic: Abdominal aorta is normal caliber with atherosclerotic calcification. There is no retroperitoneal or periportal lymphadenopathy. No pelvic lymphadenopathy. Reproductive: Unremarkable Other: No mesenteric metastasis.  Peritoneal metastasis Musculoskeletal: No aggressive osseous lesion. IMPRESSION: Chest: 1. Stable lingular nodule. 2. No evidence of thoracic metastasis. Abdomen / Pelvis Impression: 1. Focal tumor recurrence in the posterior RIGHT hepatic lobe is not well demonstrated on CT imaging. 2. No evidence of new hepatic metastasis by CT imaging. 3. No evidence of metastatic disease in the abdomen pelvis. Electronically Signed   By: SSuzy BouchardM.D.   On: 08/15/2022 11:49    Labs:  CBC: Recent Labs    06/16/22 0858 08/21/22 1135  WBC 11.3* 11.6*  HGB 12.9* 12.6*  HCT 38.3* 37.8*  PLT 157 183     COAGS: Recent Labs    06/16/22 0930  INR 1.1    BMP: Recent Labs    06/16/22 0858 08/14/22 1413 08/14/22 1501 08/21/22 1135  NA 136 129*  --  136  K 3.9 3.1*  --  3.5  CL 97* 88*  --  97*  CO2 30 31  --  30  GLUCOSE 262*  193*  --  230*  BUN 23 40*  --  29*  CALCIUM 9.9 10.0  --  10.0  CREATININE 1.35* 1.48* 1.70* 1.19  GFRNONAA 57* 51*  --  >60    LIVER FUNCTION TESTS: Recent Labs    06/16/22 0858 08/14/22 1413 08/21/22 1135  BILITOT 1.2 0.7 0.6  AST '23 21 29  ' ALT '20 18 29  ' ALKPHOS 101 99 96  PROT 6.7 7.3 7.3  ALBUMIN 3.2* 4.2 4.1    TUMOR MARKERS: Recent Labs    03/19/22 1203 04/21/22 0938 05/21/22 1011 08/21/22 1135  CEA 18.55* 36.20* 70.21* 103.67*    Assessment and Plan: Patient with past medical history of colon cancer presents with complaint of disease recurrence. He had a prior Port placed by Dr. Kathlene Cote  10/15/2018 and removed 05/26/2019 after achieving remission. IR consulted for Port-A-Cath placement at the request of [name], NP. Case reviewed by Dr. Dwaine Gale who approves patient for procedure.  Patient presents today in their usual state of health.  He has been NPO and is currently on blood thinners.   Risks and benefits of image guided port-a-catheter placement was discussed with the patient including, but not limited to bleeding, infection, pneumothorax, or fibrin sheath development and need for additional procedures.  All of the patient's questions were answered, patient is agreeable to proceed. Consent signed and in chart.   Thank you for this interesting consult.  I greatly enjoyed meeting Saint S Stevenson and look forward to participating in their care.  A copy of this report was sent to the requesting provider on this date.  Electronically Signed: Docia Barrier, PA 08/25/2022, 11:46 AM   I spent a total of  30 Minutes   in face to face in clinical consultation, greater than 50% of which was counseling/coordinating care  for colon cancer.

## 2022-08-25 NOTE — Progress Notes (Signed)
Patient was given discharge instructions. He verbalized understanding. 

## 2022-09-01 ENCOUNTER — Inpatient Hospital Stay: Payer: Medicare Other

## 2022-09-01 VITALS — BP 132/90 | HR 79 | Temp 97.9°F | Resp 16 | Wt 260.2 lb

## 2022-09-01 DIAGNOSIS — Z5112 Encounter for antineoplastic immunotherapy: Secondary | ICD-10-CM | POA: Diagnosis not present

## 2022-09-01 DIAGNOSIS — Z5111 Encounter for antineoplastic chemotherapy: Secondary | ICD-10-CM | POA: Diagnosis not present

## 2022-09-01 DIAGNOSIS — E860 Dehydration: Secondary | ICD-10-CM | POA: Diagnosis not present

## 2022-09-01 DIAGNOSIS — C787 Secondary malignant neoplasm of liver and intrahepatic bile duct: Secondary | ICD-10-CM | POA: Diagnosis not present

## 2022-09-01 DIAGNOSIS — C186 Malignant neoplasm of descending colon: Secondary | ICD-10-CM | POA: Diagnosis not present

## 2022-09-01 DIAGNOSIS — Z79899 Other long term (current) drug therapy: Secondary | ICD-10-CM | POA: Diagnosis not present

## 2022-09-01 MED ORDER — SODIUM CHLORIDE 0.9 % IV SOLN
400.0000 mg/m2 | Freq: Once | INTRAVENOUS | Status: AC
  Administered 2022-09-01: 1000 mg via INTRAVENOUS
  Filled 2022-09-01: qty 50

## 2022-09-01 MED ORDER — SODIUM CHLORIDE 0.9 % IV SOLN: Freq: Once | INTRAVENOUS | Status: AC

## 2022-09-01 MED ORDER — SODIUM CHLORIDE 0.9 % IV SOLN
400.0000 mg/m2 | Freq: Once | INTRAVENOUS | Status: DC
  Filled 2022-09-01: qty 50

## 2022-09-01 MED ORDER — PALONOSETRON HCL INJECTION 0.25 MG/5ML
0.2500 mg | Freq: Once | INTRAVENOUS | Status: AC
  Administered 2022-09-01: 0.25 mg via INTRAVENOUS
  Filled 2022-09-01: qty 5

## 2022-09-01 MED ORDER — SODIUM CHLORIDE 0.9 % IV SOLN
1800.0000 mg/m2 | INTRAVENOUS | Status: DC
  Administered 2022-09-01: 4500 mg via INTRAVENOUS
  Filled 2022-09-01: qty 90

## 2022-09-01 MED ORDER — SODIUM CHLORIDE 0.9 % IV SOLN
180.0000 mg/m2 | Freq: Once | INTRAVENOUS | Status: AC
  Administered 2022-09-01: 460 mg via INTRAVENOUS
  Filled 2022-09-01: qty 5

## 2022-09-01 MED ORDER — SODIUM CHLORIDE 0.9 % IV SOLN
10.0000 mg | Freq: Once | INTRAVENOUS | Status: AC
  Administered 2022-09-01: 10 mg via INTRAVENOUS
  Filled 2022-09-01: qty 10

## 2022-09-01 MED ORDER — ONDANSETRON HCL 8 MG PO TABS: 8.0000 mg | ORAL_TABLET | Freq: Three times a day (TID) | ORAL | 1 refills | Status: DC | PRN

## 2022-09-01 MED ORDER — FLUOROURACIL CHEMO INJECTION 2.5 GM/50ML
400.0000 mg/m2 | Freq: Once | INTRAVENOUS | Status: AC
  Administered 2022-09-01: 1000 mg via INTRAVENOUS
  Filled 2022-09-01: qty 20

## 2022-09-01 MED ORDER — ATROPINE SULFATE 1 MG/ML IV SOLN
0.5000 mg | Freq: Once | INTRAVENOUS | Status: AC | PRN
  Administered 2022-09-01: 0.5 mg via INTRAVENOUS
  Filled 2022-09-01: qty 1

## 2022-09-01 MED ORDER — PROCHLORPERAZINE MALEATE 10 MG PO TABS: 10.0000 mg | ORAL_TABLET | Freq: Four times a day (QID) | ORAL | 1 refills | Status: DC | PRN

## 2022-09-01 MED ORDER — SODIUM CHLORIDE 0.9 % IV SOLN
5.0000 mg/kg | Freq: Once | INTRAVENOUS | Status: AC
  Administered 2022-09-01: 600 mg via INTRAVENOUS
  Filled 2022-09-01: qty 16

## 2022-09-01 NOTE — Progress Notes (Signed)
Patient started to have stomach upset and diarrhea close to the end of his Irinotecan infusion.  Infusion pause, atropine given, and infusion restarted.

## 2022-09-01 NOTE — Patient Instructions (Addendum)
Irinotecan Injection What is this medication? IRINOTECAN (ir in oh TEE kan) treats some types of cancer. It works by slowing down the growth of cancer cells. This medicine may be used for other purposes; ask your health care provider or pharmacist if you have questions. COMMON BRAND NAME(S): Camptosar What should I tell my care team before I take this medication? They need to know if you have any of these conditions: Dehydration Diarrhea Infection, especially a viral infection, such as chickenpox, cold sores, herpes Liver disease Low blood cell levels (white cells, red cells, and platelets) Low levels of electrolytes, such as calcium, magnesium, or potassium in your blood Recent or ongoing radiation An unusual or allergic reaction to irinotecan, other medications, foods, dyes, or preservatives If you or your partner are pregnant or trying to get pregnant Breast-feeding How should I use this medication? This medication is injected into a vein. It is given by your care team in a hospital or clinic setting. Talk to your care team about the use of this medication in children. Special care may be needed. Overdosage: If you think you have taken too much of this medicine contact a poison control center or emergency room at once. NOTE: This medicine is only for you. Do not share this medicine with others. What if I miss a dose? Keep appointments for follow-up doses. It is important not to miss your dose. Call your care team if you are unable to keep an appointment. What may interact with this medication? Do not take this medication with any of the following: Cobicistat Itraconazole This medication may also interact with the following: Certain antibiotics, such as clarithromycin, rifampin, rifabutin Certain antivirals for HIV or AIDS Certain medications for fungal infections, such as ketoconazole, posaconazole, voriconazole Certain medications for seizures, such as carbamazepine,  phenobarbital, phenytoin Gemfibrozil Nefazodone St. John's wort This list may not describe all possible interactions. Give your health care provider a list of all the medicines, herbs, non-prescription drugs, or dietary supplements you use. Also tell them if you smoke, drink alcohol, or use illegal drugs. Some items may interact with your medicine. What should I watch for while using this medication? Your condition will be monitored carefully while you are receiving this medication. You may need blood work while taking this medication. This medication may make you feel generally unwell. This is not uncommon as chemotherapy can affect healthy cells as well as cancer cells. Report any side effects. Continue your course of treatment even though you feel ill unless your care team tells you to stop. This medication can cause serious side effects. To reduce the risk, your care team may give you other medications to take before receiving this one. Be sure to follow the directions from your care team. This medication may affect your coordination, reaction time, or judgement. Do not drive or operate machinery until you know how this medication affects you. Sit up or stand slowly to reduce the risk of dizzy or fainting spells. Drinking alcohol with this medication can increase the risk of these side effects. This medication may increase your risk of getting an infection. Call your care team for advice if you get a fever, chills, sore throat, or other symptoms of a cold or flu. Do not treat yourself. Try to avoid being around people who are sick. Avoid taking medications that contain aspirin, acetaminophen, ibuprofen, naproxen, or ketoprofen unless instructed by your care team. These medications may hide a fever. This medication may increase your risk to bruise or  bleed. Call your care team if you notice any unusual bleeding. Be careful brushing or flossing your teeth or using a toothpick because you may get an  infection or bleed more easily. If you have any dental work done, tell your dentist you are receiving this medication. Talk to your care team if you or your partner are pregnant or think either of you might be pregnant. This medication can cause serious birth defects if taken during pregnancy and for 6 months after the last dose. You will need a negative pregnancy test before starting this medication. Contraception is recommended while taking this medication and for 6 months after the last dose. Your care team can help you find the option that works for you. Do not father a child while taking this medication and for 3 months after the last dose. Use a condom for contraception during this time period. Do not breastfeed while taking this medication and for 7 days after the last dose. This medication may cause infertility. Talk to your care team if you are concerned about your fertility. What side effects may I notice from receiving this medication? Side effects that you should report to your care team as soon as possible: Allergic reactions--skin rash, itching, hives, swelling of the face, lips, tongue, or throat Dry cough, shortness of breath or trouble breathing Increased saliva or tears, increased sweating, stomach cramping, diarrhea, small pupils, unusual weakness or fatigue, slow heartbeat Infection--fever, chills, cough, sore throat, wounds that don't heal, pain or trouble when passing urine, general feeling of discomfort or being unwell Kidney injury--decrease in the amount of urine, swelling of the ankles, hands, or feet Low red blood cell level--unusual weakness or fatigue, dizziness, headache, trouble breathing Severe or prolonged diarrhea Unusual bruising or bleeding Side effects that usually do not require medical attention (report to your care team if they continue or are bothersome): Constipation Diarrhea Hair loss Loss of appetite Nausea Stomach pain This list may not describe all  possible side effects. Call your doctor for medical advice about side effects. You may report side effects to FDA at 1-800-FDA-1088. Where should I keep my medication? This medication is given in a hospital or clinic. It will not be stored at home. NOTE: This sheet is a summary. It may not cover all possible information. If you have questions about this medicine, talk to your doctor, pharmacist, or health care provider.  2023 Else[employer]/Gold Standard (2022-03-13 00:00:00) Leucovorin Injection What is this medication? LEUCOVORIN (loo koe VOR in) prevents side effects from certain medications, such as methotrexate. It works by increasing folate levels. This helps protect healthy cells in your body. It may also be used to treat anemia caused by low levels of folate. It can also be used with fluorouracil, a type of chemotherapy, to treat colorectal cancer. It works by increasing the effects of fluorouracil in the body. This medicine may be used for other purposes; ask your health care provider or pharmacist if you have questions. What should I tell my care team before I take this medication? They need to know if you have any of these conditions: Anemia from low levels of vitamin B12 in the blood An unusual or allergic reaction to leucovorin, folic acid, other medications, foods, dyes, or preservatives Pregnant or trying to get pregnant Breastfeeding How should I use this medication? This medication is injected into a vein or a muscle. It is given by your care team in a hospital or clinic setting. Talk to your care team about  the use of this medication in children. Special care may be needed. Overdosage: If you think you have taken too much of this medicine contact a poison control center or emergency room at once. NOTE: This medicine is only for you. Do not share this medicine with others. What if I miss a dose? Keep appointments for follow-up doses. It is important not to miss your dose. Call your  care team if you are unable to keep an appointment. What may interact with this medication? Capecitabine Fluorouracil Phenobarbital Phenytoin Primidone Trimethoprim;sulfamethoxazole This list may not describe all possible interactions. Give your health care provider a list of all the medicines, herbs, non-prescription drugs, or dietary supplements you use. Also tell them if you smoke, drink alcohol, or use illegal drugs. Some items may interact with your medicine. What should I watch for while using this medication? Your condition will be monitored carefully while you are receiving this medication. This medication may increase the side effects of 5-fluorouracil. Tell your care team if you have diarrhea or mouth sores that do not get better or that get worse. What side effects may I notice from receiving this medication? Side effects that you should report to your care team as soon as possible: Allergic reactions--skin rash, itching, hives, swelling of the face, lips, tongue, or throat This list may not describe all possible side effects. Call your doctor for medical advice about side effects. You may report side effects to FDA at 1-800-FDA-1088. Where should I keep my medication? This medication is given in a hospital or clinic. It will not be stored at home. NOTE: This sheet is a summary. It may not cover all possible information. If you have questions about this medicine, talk to your doctor, pharmacist, or health care provider.  2023 Else[employer]/Gold Standard (2022-03-14 00:00:00)  Fluorouracil, 5FU; Diclofenac topical cream What is this medication? FLUOROURACIL; DICLOFENAC (flure oh YOOR a sil; dye KLOE fen ak) is a combination of a topical chemotherapy agent and non-steroidal anti-inflammatory drug (NSAID). It is used on the skin to treat skin cancer and skin conditions that could become cancer. This medicine may be used for other purposes; ask your health care provider or pharmacist if  you have questions. COMMON BRAND NAME(S): FLUORAC What should I tell my care team before I take this medication? They need to know if you have any of these conditions: bleeding problems cigarette smoker DPD enzyme deficiency heart disease high blood pressure if you frequently drink alcohol containing drinks kidney disease liver disease open or infected skin stomach problems swelling or open sores at the treatment site recent or planned coronary artery bypass graft (CABG) surgery an unusual or allergic reaction to fluorouracil, diclofenac, aspirin, other NSAIDs, other medicines, foods, dyes, or preservatives pregnant or trying to get pregnant breast-feeding How should I use this medication? This medicine is only for use on the skin. Follow the directions on the prescription label. Wash hands before and after use. Wash affected area and gently pat dry. To apply this medicine use a cotton-tipped applicator, or use gloves if applying with fingertips. If applied with unprotected fingertips, it is very important to wash your hands well after you apply this medicine. Avoid applying to the eyes, nose, or mouth. Apply enough medicine to cover the affected area. You can cover the area with a light gauze dressing, but do not use tight or air-tight dressings. Finish the full course prescribed by your doctor or health care professional, even if you think your condition is better.  Do not stop taking except on the advice of your doctor or health care professional. Talk to your pediatrician regarding the use of this medicine in children. Special care may be needed. Overdosage: If you think you have taken too much of this medicine contact a poison control center or emergency room at once. NOTE: This medicine is only for you. Do not share this medicine with others. What if I miss a dose? If you miss a dose, apply it as soon as you can. If it is almost time for your next dose, only use that dose. Do not apply  extra doses. Contact your doctor or health care professional if you miss more than one dose. What may interact with this medication? Interactions are not expected. Do not use any other skin products without telling your doctor or health care professional. This list may not describe all possible interactions. Give your health care provider a list of all the medicines, herbs, non-prescription drugs, or dietary supplements you use. Also tell them if you smoke, drink alcohol, or use illegal drugs. Some items may interact with your medicine. What should I watch for while using this medication? Visit your doctor or healthcare provider for checks on your progress. You will need to use this medicine for 2 to 6 weeks. This may be longer depending on the condition being treated. You may not see full healing for another 1 to 2 months after you stop using the medicine. This medicine may cause serious skin reactions. They can happen weeks to months after starting the medicine. Contact your healthcare provider right away if you notice fevers or flu-like symptoms with a rash. The rash may be red or purple and then turn into blisters or peeling of the skin. Or, you might notice a red rash with swelling of the face, lips or lymph nodes in your neck or under your arms. Treated areas of skin can look unsightly during and for several weeks after treatment with this medicine. This medicine can make you more sensitive to the sun. Keep out of the sun. If you cannot avoid being in the sun, wear protective clothing and use sunscreen. Do not use sun lamps or tanning beds/booths. Serious side effects or death can occur if a pet comes into contact with this drug. Contact a vet right away if a pet touches or licks the drug on your skin or comes into contact with the container. Throw away or wash any items used to apply this drug. Wash your hands after applying the drug. Make sure the drug does not get on clothing, carpet, or furniture.  If you cannot avoid skin to skin contact with your pet, ask your health care provider if you can cover the area(s) where you apply this drug. Do not become pregnant while taking this medicine. Women should inform their doctor if they wish to become pregnant or think they might be pregnant. There is a potential for serious side effects to an unborn child. Talk to your healthcare provider or pharmacist for more information. What side effects may I notice from receiving this medication? Side effects that you should report to your doctor or health care professional as soon as possible: allergic reactions like skin rash, itching or hives, swelling of the face, lips, or tongue black or bloody stools, blood in the urine or vomit blurred vision chest pain difficulty breathing or wheezing rash, fever, and swollen lymph nodes redness, blistering, peeling or loosening of the skin, including inside the mouth severe  redness and swelling of normal skin slurred speech or weakness on one side of the body trouble passing urine or change in the amount of urine unexplained weight gain or swelling unusually weak or tired yellowing of eyes or skin Side effects that usually do not require medical attention (report to your doctor or health care professional if they continue or are bothersome): increased sensitivity of the skin to sun and ultraviolet light pain and burning of the affected area scaling or swelling of the affected area skin rash, itching of the affected area tenderness This list may not describe all possible side effects. Call your doctor for medical advice about side effects. You may report side effects to FDA at 1-800-FDA-1088. Where should I keep my medication? Keep out of the reach of children and pets. Store at room temperature between 20 and 25 degrees C (68 and 77 degrees F). Throw away any unused medicine after the expiration date. NOTE: This sheet is a summary. It may not cover all  possible information. If you have questions about this medicine, talk to your doctor, pharmacist, or health care provider.  2023 Elsevier/Gold Standard (2021-10-04 00:00:00) Bevacizumab Injection What is this medication? BEVACIZUMAB (be va SIZ yoo mab) treats some types of cancer. It works by blocking a protein that causes cancer cells to grow and multiply. This helps to slow or stop the spread of cancer cells. It is a monoclonal antibody. This medicine may be used for other purposes; ask your health care provider or pharmacist if you have questions. COMMON BRAND NAME(S): Alymsys, Avastin, MVASI, Noah Charon What should I tell my care team before I take this medication? They need to know if you have any of these conditions: Blood clots Coughing up blood Having or recent surgery Heart failure High blood pressure History of a connection between 2 or more body parts that do not usually connect (fistula) History of a tear in your stomach or intestines Protein in your urine An unusual or allergic reaction to bevacizumab, other medications, foods, dyes, or preservatives Pregnant or trying to get pregnant Breast-feeding How should I use this medication? This medication is injected into a vein. It is given by your care team in a hospital or clinic setting. Talk to your care team the use of this medication in children. Special care may be needed. Overdosage: If you think you have taken too much of this medicine contact a poison control center or emergency room at once. NOTE: This medicine is only for you. Do not share this medicine with others. What if I miss a dose? Keep appointments for follow-up doses. It is important not to miss your dose. Call your care team if you are unable to keep an appointment. What may interact with this medication? Interactions are not expected. This list may not describe all possible interactions. Give your health care provider a list of all the medicines, herbs,  non-prescription drugs, or dietary supplements you use. Also tell them if you smoke, drink alcohol, or use illegal drugs. Some items may interact with your medicine. What should I watch for while using this medication? Your condition will be monitored carefully while you are receiving this medication. You may need blood work while taking this medication. This medication may make you feel generally unwell. This is not uncommon as chemotherapy can affect healthy cells as well as cancer cells. Report any side effects. Continue your course of treatment even though you feel ill unless your care team tells you to stop. This medication  may increase your risk to bruise or bleed. Call your care team if you notice any unusual bleeding. Before having surgery, talk to your care team to make sure it is ok. This medication can increase the risk of poor healing of your surgical site or wound. You will need to stop this medication for 28 days before surgery. After surgery, wait at least 28 days before restarting this medication. Make sure the surgical site or wound is healed enough before restarting this medication. Talk to your care team if questions. Talk to your care team if you may be pregnant. Serious birth defects can occur if you take this medication during pregnancy and for 6 months after the last dose. Contraception is recommended while taking this medication and for 6 months after the last dose. Your care team can help you find the option that works for you. Do not breastfeed while taking this medication and for 6 months after the last dose. This medication can cause infertility. Talk to your care team if you are concerned about your fertility. What side effects may I notice from receiving this medication? Side effects that you should report to your care team as soon as possible: Allergic reactions--skin rash, itching, hives, swelling of the face, lips, tongue, or throat Bleeding--bloody or black, tar-like  stools, vomiting blood or brown material that looks like coffee grounds, red or dark brown urine, small red or purple spots on skin, unusual bruising or bleeding Blood clot--pain, swelling, or warmth in the leg, shortness of breath, chest pain Heart attack--pain or tightness in the chest, shoulders, arms, or jaw, nausea, shortness of breath, cold or clammy skin, feeling faint or lightheaded Heart failure--shortness of breath, swelling of the ankles, feet, or hands, sudden weight gain, unusual weakness or fatigue Increase in blood pressure Infection--fever, chills, cough, sore throat, wounds that don't heal, pain or trouble when passing urine, general feeling of discomfort or being unwell Infusion reactions--chest pain, shortness of breath or trouble breathing, feeling faint or lightheaded Kidney injury--decrease in the amount of urine, swelling of the ankles, hands, or feet Stomach pain that is severe, does not go away, or gets worse Stroke--sudden numbness or weakness of the face, arm, or leg, trouble speaking, confusion, trouble walking, loss of balance or coordination, dizziness, severe headache, change in vision Sudden and severe headache, confusion, change in vision, seizures, which may be signs of posterior reversible encephalopathy syndrome (PRES) Side effects that usually do not require medical attention (report to your care team if they continue or are bothersome): Back pain Change in taste Diarrhea Dry skin Increased tears Nosebleed This list may not describe all possible side effects. Call your doctor for medical advice about side effects. You may report side effects to FDA at 1-800-FDA-1088. Where should I keep my medication? This medication is given in a hospital or clinic. It will not be stored at home. NOTE: This sheet is a summary. It may not cover all possible information. If you have questions about this medicine, talk to your doctor, pharmacist, or health care provider.   2023 [employer] Standard (2022-03-18 00:00:00)  The chemotherapy medication bag should finish at 46 hours, 96 hours, or 7 days. For example, if your pump is scheduled for 46 hours and it was put on at 4:00 p.m., it should finish at 2:00 p.m. the day it is scheduled to come off regardless of your appointment time.     Estimated time to finish at 11:00 a.m. on Wednesday 09/03/2022.   If the  display on your pump reads "Low Volume" and it is beeping, take the batteries out of the pump and come to the cancer center for it to be taken off.   If the pump alarms go off prior to the pump reading "Low Volume" then call [phone] and someone can assist you.  If the plunger comes out and the chemotherapy medication is leaking out, please use your home chemo spill kit to clean up the spill. Do NOT use paper towels or other household products.  If you have problems or questions regarding your pump, please call either [phone] (24 hours a day) or the cancer center Monday-Friday 8:00 a.m.- 4:30 p.m. at the clinic number and we will assist you. If you are unable to get assistance, then go to the nearest Emergency Department and ask the staff to contact the IV team for assistance.

## 2022-09-01 NOTE — Progress Notes (Signed)
Per Dr. Benay Spice: OK to treat today with labs collected on 08/21/22. Does not need urine protein today.

## 2022-09-03 ENCOUNTER — Inpatient Hospital Stay: Payer: Medicare Other

## 2022-09-03 ENCOUNTER — Inpatient Hospital Stay: Payer: Medicare Other | Admitting: Oncology

## 2022-09-03 VITALS — BP 129/76 | HR 67 | Temp 98.5°F | Resp 20

## 2022-09-03 DIAGNOSIS — Z79899 Other long term (current) drug therapy: Secondary | ICD-10-CM | POA: Diagnosis not present

## 2022-09-03 DIAGNOSIS — C787 Secondary malignant neoplasm of liver and intrahepatic bile duct: Secondary | ICD-10-CM | POA: Diagnosis not present

## 2022-09-03 DIAGNOSIS — Z5112 Encounter for antineoplastic immunotherapy: Secondary | ICD-10-CM | POA: Diagnosis not present

## 2022-09-03 DIAGNOSIS — E860 Dehydration: Secondary | ICD-10-CM | POA: Diagnosis not present

## 2022-09-03 DIAGNOSIS — C186 Malignant neoplasm of descending colon: Secondary | ICD-10-CM

## 2022-09-03 DIAGNOSIS — Z5111 Encounter for antineoplastic chemotherapy: Secondary | ICD-10-CM | POA: Diagnosis not present

## 2022-09-03 MED ORDER — HEPARIN SOD (PORK) LOCK FLUSH 100 UNIT/ML IV SOLN
500.0000 [IU] | Freq: Once | INTRAVENOUS | Status: AC | PRN
  Administered 2022-09-03: 500 [IU]

## 2022-09-03 MED ORDER — SODIUM CHLORIDE 0.9% FLUSH
10.0000 mL | INTRAVENOUS | Status: DC | PRN
  Administered 2022-09-03: 10 mL

## 2022-09-03 NOTE — Patient Instructions (Signed)
Heparin injection What is this medication? HEPARIN (HEP a rin) is an anticoagulant. It is used to treat or prevent clots in the veins, arteries, lungs, or heart. It stops clots from forming or getting bigger. This medicine prevents clotting during open-heart surgery, dialysis, or in patients who are confined to bed. This medicine may be used for other purposes; ask your health care provider or pharmacist if you have questions. COMMON BRAND NAME(S): Hep-Lock, Hep-Lock U/P, Hepflush-10, Monoject Prefill Advanced Heparin Lock Flush, SASH Normal Saline and Heparin What should I tell my care team before I take this medication? They need to know if you have any of these conditions: bleeding disorders, such as hemophilia or low blood platelets bowel disease or diverticulitis endocarditis high blood pressure liver disease recent surgery or delivery of a baby stomach ulcers an unusual or allergic reaction to heparin, benzyl alcohol, sulfites, other medicines, foods, dyes, or preservatives pregnant or trying to get pregnant breast-feeding How should I use this medication? This medicine is given by injection or infusion into a vein. It can also be given by injection of small amounts under the skin. It is usually given by a health care professional in a hospital or clinic setting. If you get this medicine at home, you will be taught how to prepare and give this medicine. Use exactly as directed. Take your medicine at regular intervals. Do not take it more often than directed. Do not stop taking except on your doctor's advice. Stopping this medicine may increase your risk of a blot clot. Be sure to refill your prescription before you run out of medicine. It is important that you put your used needles and syringes in a special sharps container. Do not put them in a trash can. If you do not have a sharps container, call your pharmacist or healthcare provider to get one. Talk to your pediatrician regarding the  use of this medicine in children. While this medicine may be prescribed for children for selected conditions, precautions do apply. Overdosage: If you think you have taken too much of this medicine contact a poison control center or emergency room at once. NOTE: This medicine is only for you. Do not share this medicine with others. What if I miss a dose? If you miss a dose, take it as soon as you can. If it is almost time for your next dose, take only that dose. Do not take double or extra doses. What may interact with this medication? Do not take this medicine with any of the following medications: aspirin and aspirin-like drugs mifepristone medicines that treat or prevent blood clots like warfarin, enoxaparin, and dalteparin palifermin protamine This medicine may also interact with the following medications: dextran digoxin hydroxychloroquine medicines for treating colds or allergies nicotine NSAIDs, medicines for pain and inflammation, like ibuprofen or naproxen phenylbutazone tetracycline antibiotics This list may not describe all possible interactions. Give your health care provider a list of all the medicines, herbs, non-prescription drugs, or dietary supplements you use. Also tell them if you smoke, drink alcohol, or use illegal drugs. Some items may interact with your medicine. What should I watch for while using this medication? Visit your healthcare professional for regular checks on your progress. You may need blood work done while you are taking this medicine. Your condition will be monitored carefully while you are receiving this medicine. It is important not to miss any appointments. Wear a medical ID bracelet or chain, and carry a card that describes your disease and details   of your medicine and dosage times. Notify your doctor or healthcare professional at once if you have cold, blue hands or feet. If you are going to need surgery or other procedure, tell your healthcare  professional that you are using this medicine. Avoid sports and activities that might cause injury while you are using this medicine. Severe falls or injuries can cause unseen bleeding. Be careful when using sharp tools or knives. Consider using an electric razor. Take special care brushing or flossing your teeth. Report any injuries, bruising, or red spots on the skin to your healthcare professional. Using this medicine for a long time may weaken your bones and increase the risk of bone fractures. You should make sure that you get enough calcium and vitamin D while you are taking this medicine. Discuss the foods you eat and the vitamins you take with your healthcare professional. Wear a medical ID bracelet or chain. Carry a card that describes your disease and details of your medicine and dosage times. What side effects may I notice from receiving this medication? Side effects that you should report to your doctor or health care professional as soon as possible: allergic reactions like skin rash, itching or hives, swelling of the face, lips, or tongue bone pain fever, chills nausea, vomiting signs and symptoms of bleeding such as bloody or black, tarry stools; red or dark-brown urine; spitting up blood or brown material that looks like coffee grounds; red spots on the skin; unusual bruising or bleeding from the eye, gums, or nose signs and symptoms of a blood clot such as chest pain; shortness of breath; pain, swelling, or warmth in the leg signs and symptoms of a stroke such as changes in vision; confusion; trouble speaking or understanding; severe headaches; sudden numbness or weakness of the face, arm or leg; trouble walking; dizziness; loss of coordination Side effects that usually do not require medical attention (report to your doctor or health care professional if they continue or are bothersome): hair loss pain, redness, or irritation at site where injected This list may not describe all  possible side effects. Call your doctor for medical advice about side effects. You may report side effects to FDA at 1-800-FDA-1088. Where should I keep my medication? Keep out of the reach of children. Store unopened vials at room temperature between 15 and 30 degrees C (59 and 86 degrees F). Do not freeze. Do not use if solution is discolored or particulate matter is present. Throw away any unused medicine after the expiration date. NOTE: This sheet is a summary. It may not cover all possible information. If you have questions about this medicine, talk to your doctor, pharmacist, or health care provider.  2023 Elsevier/Gold Standard (2005-08-11 00:00:00)  

## 2022-09-10 DIAGNOSIS — F419 Anxiety disorder, unspecified: Secondary | ICD-10-CM | POA: Diagnosis not present

## 2022-09-10 DIAGNOSIS — Z713 Dietary counseling and surveillance: Secondary | ICD-10-CM | POA: Diagnosis not present

## 2022-09-10 DIAGNOSIS — Z6833 Body mass index (BMI) 33.0-33.9, adult: Secondary | ICD-10-CM | POA: Diagnosis not present

## 2022-09-10 DIAGNOSIS — Z299 Encounter for prophylactic measures, unspecified: Secondary | ICD-10-CM | POA: Diagnosis not present

## 2022-09-10 DIAGNOSIS — I10 Essential (primary) hypertension: Secondary | ICD-10-CM | POA: Diagnosis not present

## 2022-09-11 ENCOUNTER — Ambulatory Visit: Payer: Medicare Other | Admitting: Urology

## 2022-09-11 NOTE — Progress Notes (Deleted)
Assessment: 1. Urinary frequency   2. Nocturia   3. Elevated PSA      Plan: Urine culture sent today - will call with results Continue Myrbetriq 25 mg daily.  I advised him that the medication will have better results if he takes it on a daily basis. Return to office in 3 months.   Chief Complaint:  No chief complaint on file.   History of Present Illness:  Jeremy Stevenson is a 71 y.o. year old male who is seen for further evaluation of lower urinary tract symptoms.  At his initial visit in March 2023, he reported a 10-year history of lower urinary tract symptoms with worsening in the past several years.  He has urinary frequency, urgency, and nocturia, voiding 2 times per hour during the night.  He also noted urge incontinence and an intermittent stream at times.  No dysuria or gross hematuria.  No recent UTIs.  He was previously treated with alfuzosin but did not see an improvement in his symptoms.  He was not  taking any medication for his urinary symptoms at the time of his visit in March 2023. IPSS = 21 PVR = 7 ml  PSA from 10/22/2021: 4.6 PSA from  02/14/2022: 4.4  with 31.4% free  He has a history of colon cancer and is status post colectomy.  He has received chemotherapy and radiation therapy for liver metastasis.  He is currently followed by oncology in [place].  He was started on Myrbetriq 25 mg daily. At his visit in April 2023, he had noted improvement in his urinary symptoms with Myrbetriq with decreased frequency and decreased nocturia.  No side effects from the medication.  No dysuria or gross hematuria.  At his visit in July 2023, he continued on Myrbetriq 25 mg.  He was not taking this on a regular basis.  He continued to have frequency, voiding every 2 hours, urgency, and occasional urge incontinence.  He also reported nocturia 3-4 times.  No dysuria or gross hematuria. Urine culture grew 50-100 K Staph epi.  He was treated with Bactrim x7 days.    Portions  of the above documentation were copied from a prior visit for review purposes only.    Past Medical History:  Past Medical History:  Diagnosis Date   Colon cancer [facility])    Depression    Genetic testing 03/16/2017   [name] underwent genetic counseling and testing for hereditary cancer syndromes on 03/03/2017. His results were negative for mutations in all 46 genes analyzed by [employer]'s Common Hereditary Cancers Panel. 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 01/20/2017   Peripheral neuropathy     Past Surgical History:  Past Surgical History:  Procedure Laterality Date   BIOPSY  05/03/2020   Procedure: BIOPSY;  Surgeon: Rogene Houston, MD;  [facility];  Service: Endoscopy;;   COLONOSCOPY N/A 01/02/2017   Procedure: COLONOSCOPY;  Surgeon: Rogene Houston, MD;  [facility];  Service: Endoscopy;  Laterality: N/A;  1000   COLONOSCOPY N/A 08/07/2017   Procedure: COLONOSCOPY;  Surgeon: Rogene Houston, MD;  [facility];  Service: Endoscopy;  Laterality: N/A;  1045 - per Hurshel Keys of new time   COLONOSCOPY N/A 05/03/2020   Procedure: COLONOSCOPY;  Surgeon: Laural Golden,  Mechele Dawley, MD;  [facility]: [facility];  Service: Endoscopy;  Laterality: N/A;  0830   HERNIA REPAIR     71 years old   IR IMAGING GUIDED PORT INSERTION  10/13/2018   IR IMAGING GUIDED PORT INSERTION  08/25/2022   IR RADIOLOGIST EVAL & MGMT  09/07/2018   IR RADIOLOGIST EVAL & MGMT  11/03/2018   IR RADIOLOGIST EVAL & MGMT  10/25/2019   IR RADIOLOGIST EVAL & MGMT  03/29/2020   IR RADIOLOGIST EVAL & MGMT  12/25/2020   IR REMOVAL TUN ACCESS W/ PORT W/O FL MOD SED  05/26/2019   LAPAROSCOPIC SIGMOID COLECTOMY N/A 01/20/2017   Procedure: LAPAROSCOPIC LEFT COLECTOMY, TAKEDOWN SPLENIC FLEXURE, AND  BIOPSY OF PERITONEAL NODULE;  Surgeon: Fanny Skates, MD;  Location: [facility];  Service: General;  Laterality: N/A;   POLYPECTOMY  05/03/2020   Procedure: POLYPECTOMY;  Surgeon: Rogene Houston, MD;  [facility];  Service: Endoscopy;;   RADIOLOGY WITH ANESTHESIA N/A 09/29/2018   Procedure: CT WITH ANESTHESIA/MICROWAVE THERMAL ABLATION OF LIVER, BIOPSY;  Surgeon: Corrie Mckusick, DO;  Location: [facility];  Service: Anesthesiology;  Laterality: N/A;   TONSILLECTOMY     71 years old    Allergies:  Allergies  Allergen Reactions   Codeine Nausea Only    Family History:  Family History  Problem Relation Age of Onset   Breast cancer Mother 68   Skin cancer Mother    Ovarian cancer Sister 65       d.50 due to metastases   Skin cancer Maternal Uncle    Skin cancer Cousin 73   Skin cancer Cousin 60    Social History:  Social History   Tobacco Use   Smoking status: Never   Smokeless tobacco: Never  Vaping Use   Vaping Use: Never used  Substance Use Topics   Alcohol use: No   Drug use: No    ROS: Constitutional:  Negative for fever, chills, weight loss CV: Negative for chest pain, previous MI, hypertension Respiratory:  Negative for shortness of breath, wheezing, sleep apnea, frequent cough GI:  Negative for nausea, vomiting, bloody stool, GERD  Physical exam: There were no vitals taken for this visit. GENERAL APPEARANCE:  Well appearing, well developed, well nourished, NAD HEENT:  Atraumatic, normocephalic, oropharynx clear NECK:  Supple without lymphadenopathy or thyromegaly ABDOMEN:  Soft, non-tender, no masses EXTREMITIES:  Moves all extremities well, without clubbing, cyanosis, or edema NEUROLOGIC:  Alert and oriented x 3, normal gait, CN II-XII grossly intact MENTAL STATUS:  appropriate BACK:  Non-tender to palpation, No CVAT SKIN:  Warm, dry, and intact  Results: U/A:

## 2022-09-14 ENCOUNTER — Other Ambulatory Visit: Payer: Self-pay | Admitting: Oncology

## 2022-09-14 DIAGNOSIS — C186 Malignant neoplasm of descending colon: Secondary | ICD-10-CM

## 2022-09-15 ENCOUNTER — Inpatient Hospital Stay (HOSPITAL_BASED_OUTPATIENT_CLINIC_OR_DEPARTMENT_OTHER): Payer: Medicare Other | Admitting: Nurse Practitioner

## 2022-09-15 ENCOUNTER — Inpatient Hospital Stay: Payer: Medicare Other

## 2022-09-15 ENCOUNTER — Encounter: Payer: Self-pay | Admitting: Nurse Practitioner

## 2022-09-15 VITALS — BP 156/95 | HR 73 | Temp 98.2°F | Resp 18 | Ht 74.0 in | Wt 258.0 lb

## 2022-09-15 VITALS — BP 144/96 | HR 63

## 2022-09-15 DIAGNOSIS — C186 Malignant neoplasm of descending colon: Secondary | ICD-10-CM

## 2022-09-15 DIAGNOSIS — Z95828 Presence of other vascular implants and grafts: Secondary | ICD-10-CM

## 2022-09-15 DIAGNOSIS — E860 Dehydration: Secondary | ICD-10-CM | POA: Diagnosis not present

## 2022-09-15 DIAGNOSIS — Z5112 Encounter for antineoplastic immunotherapy: Secondary | ICD-10-CM | POA: Diagnosis not present

## 2022-09-15 DIAGNOSIS — Z79899 Other long term (current) drug therapy: Secondary | ICD-10-CM | POA: Diagnosis not present

## 2022-09-15 DIAGNOSIS — C787 Secondary malignant neoplasm of liver and intrahepatic bile duct: Secondary | ICD-10-CM | POA: Diagnosis not present

## 2022-09-15 DIAGNOSIS — Z5111 Encounter for antineoplastic chemotherapy: Secondary | ICD-10-CM | POA: Diagnosis not present

## 2022-09-15 LAB — CMP (CANCER CENTER ONLY)
ALT: 18 U/L (ref 0–44)
AST: 16 U/L (ref 15–41)
Albumin: 4 g/dL (ref 3.5–5.0)
Alkaline Phosphatase: 111 U/L (ref 38–126)
Anion gap: 13 (ref 5–15)
BUN: 13 mg/dL (ref 8–23)
CO2: 28 mmol/L (ref 22–32)
Calcium: 9 mg/dL (ref 8.9–10.3)
Chloride: 99 mmol/L (ref 98–111)
Creatinine: 1.11 mg/dL (ref 0.61–1.24)
GFR, Estimated: >60 mL/min (ref >60)
Glucose, Bld: 154 mg/dL — ABNORMAL HIGH (ref 70–99)
Potassium: 3.3 mmol/L — ABNORMAL LOW (ref 3.5–5.1)
Sodium: 140 mmol/L (ref 135–145)
Total Bilirubin: 0.9 mg/dL (ref 0.3–1.2)
Total Protein: 6.9 g/dL (ref 6.5–8.1)

## 2022-09-15 LAB — CBC WITH DIFFERENTIAL (CANCER CENTER ONLY)
Abs Immature Granulocytes: 0.02 10*3/uL (ref 0.00–0.07)
Basophils Absolute: 0 10*3/uL (ref 0.0–0.1)
Basophils Relative: 1 %
Eosinophils Absolute: 0.6 10*3/uL — ABNORMAL HIGH (ref 0.0–0.5)
Eosinophils Relative: 10 %
HCT: 35 % — ABNORMAL LOW (ref 39.0–52.0)
Hemoglobin: 11.9 g/dL — ABNORMAL LOW (ref 13.0–17.0)
Immature Granulocytes: 0 %
Lymphocytes Relative: 18 %
Lymphs Abs: 1 10*3/uL (ref 0.7–4.0)
MCH: 29.2 pg (ref 26.0–34.0)
MCHC: 34 g/dL (ref 30.0–36.0)
MCV: 85.8 fL (ref 80.0–100.0)
Monocytes Absolute: 0.4 10*3/uL (ref 0.1–1.0)
Monocytes Relative: 7 %
Neutro Abs: 3.4 10*3/uL (ref 1.7–7.7)
Neutrophils Relative %: 64 %
Platelet Count: 144 10*3/uL — ABNORMAL LOW (ref 150–400)
RBC: 4.08 MIL/uL — ABNORMAL LOW (ref 4.22–5.81)
RDW: 14.2 % (ref 11.5–15.5)
WBC Count: 5.3 10*3/uL (ref 4.0–10.5)
nRBC: 0 % (ref 0.0–0.2)

## 2022-09-15 LAB — TOTAL PROTEIN, URINE DIPSTICK: Protein, ur: 30 mg/dL — AB

## 2022-09-15 LAB — CEA (ACCESS): CEA (CHCC): 98.22 ng/mL — ABNORMAL HIGH (ref 0.00–5.00)

## 2022-09-15 MED ORDER — SODIUM CHLORIDE 0.9 % IV SOLN: INTRAVENOUS | Status: AC

## 2022-09-15 MED ORDER — HEPARIN SOD (PORK) LOCK FLUSH 100 UNIT/ML IV SOLN
500.0000 [IU] | Freq: Once | INTRAVENOUS | Status: AC
  Administered 2022-09-15: 500 [IU] via INTRAVENOUS

## 2022-09-15 MED ORDER — LOPERAMIDE HCL 2 MG PO CAPS
4.0000 mg | ORAL_CAPSULE | Freq: Once | ORAL | Status: AC
  Administered 2022-09-15: 4 mg via ORAL
  Filled 2022-09-15: qty 2

## 2022-09-15 MED ORDER — SODIUM CHLORIDE 0.9% FLUSH
10.0000 mL | Freq: Once | INTRAVENOUS | Status: AC
  Administered 2022-09-15: 10 mL via INTRAVENOUS

## 2022-09-15 NOTE — Patient Instructions (Signed)

## 2022-09-15 NOTE — Progress Notes (Signed)
Patient seen by [name] NP today  Vitals are within treatment parameters.  Labs reviewed by [name] NP and are within treatment parameters.  Per physician team, patient will not be receiving treatment today. Per [name], NP pt will receive IV NS today.

## 2022-09-15 NOTE — Progress Notes (Signed)
[place] OFFICE PROGRESS NOTE   Diagnosis: Colon cancer  INTERVAL HISTORY:   [name] returns as scheduled.  He completed cycle 1 FOLFIRI/bevacizumab 09/01/2022.  He began having diarrhea day 2.  He began taking Kaopectate with partial improvement.  He notes stools are black.  He continues to have loose stools 3-4 times a day.  No nausea or vomiting.  No mouth sores.  He is not aware of any bleeding.  He is lightheaded at times.  He had 2 falls over the weekend.  He feels fluid intake is adequate.    Objective:  Vital signs in last 24 hours:  Blood pressure (!) 156/95, pulse 73, temperature 98.2 F (36.8 C), temperature source Oral, resp. rate 18, height _0  (1.88 m), weight 258 lb (117 kg), SpO2 100 %.    HEENT: No thrush or ulcers. Resp: Lungs clear bilaterally. Cardio: Regular rate and rhythm. GI: Abdomen soft and nontender.  No hepatomegaly. Vascular: No leg edema. Neuro: Alert and oriented. Skin: Decreased skin turgor. Port-A-Cath without erythema.   Lab Results:  Lab Results  Component Value Date   WBC 5.3 09/15/2022   HGB 11.9 (L) 09/15/2022   HCT 35.0 (L) 09/15/2022   MCV 85.8 09/15/2022   PLT 144 (L) 09/15/2022   NEUTROABS 3.4 09/15/2022    Imaging:  No results found.  Medications: I have reviewed the patient's current medications.  Assessment/Plan: Adenocarcinoma of the descending colon, stage II (T3 N0), status post a left colectomy 01/20/2017 MSI-stable, no loss of mismatch repair protein expression Elevated preoperative CEA Incomplete preoperative colonoscopy Colonoscopy 08/07/2017-multiple polyps removed including tubular adenomas CT abdomen/pelvis 02/05/2018-no evidence of metastatic disease. Elevated CEA June 2019 CTs 06/29/2018-no evidence of metastatic disease, stable mild bilateral iliac adenopathy PET scan 08/03/2018- solitary hypermetabolic central right liver metastasis; asymmetric hypermetabolism right palatine tonsil  with associated mild asymmetric soft tissue fullness on CT images MRI liver 08/25/2018- 2.6 cm mass in the central right liver consistent with metastasis, no other evidence of metastatic disease Ablation and biopsy of right liver lesion 09/29/2018-pathology revealed adenocarcinoma; foundation 1-KRAS G12V, NRAS wildtype, microsatellite stable, tumor mutational burden 1 Cycle 1 FOLFOX 10/25/2018 Cycle 2 FOLFOX 11/09/2018 Cycle 3 FOLFOX 11/23/2018 Cycle 4 FOLFOX 12/07/2018 Cycle 5 FOLFOX 12/21/2018 (oxaliplatin dose reduced secondary to thrombocytopenia) Cycle 6 FOLFOX 01/04/2019 Cycle 7 FOLFOX 01/18/2019 Cycle 8 FOLFOX 02/01/2019 Cycle 9 FOLFOX 02/15/2019 MRI abdomen 02/24/2019- ablation site in the liver without residual tumor identified.  Mild intrahepatic biliary dilatation distal to the ablation site.  Diffuse hepatic steatosis.  No new liver lesions identified. Cycle 10 FOLFOX 02/28/2019 (oxaliplatin held due to neuropathy and thrombocytopenia) Cycle 11 FOLFOX 03/14/2019 (oxaliplatin eliminated from the regimen due to neuropathy, 5-fluorouracil dose adjusted due to diarrhea and tearing) Cycle 12 FOLFOX 03/28/2019 (no oxaliplatin, 5-fluorouracil as per treatment 03/14/2019) CTs 09/08/2019-no evidence of recurrent disease, no evidence of recurrent disease at the hepatic ablation site, stable renal cysts CT abdomen/pelvis 03/06/2020-stable ablation site, no evidence of disease progression, stable gastric lipoma Colonoscopy 05/03/2020-polyp removed from the cecum-hyperplastic polyp Elevated CEA 09/06/2020 CTs 09/06/2020-no evidence of recurrent disease, stable ablation change in the right liver Further elevation of CEA 11/12/2020 PET 11/22/2020- small focus of hypermetabolism in the medial right liver at the previous ablation site, no other evidence of metastatic disease MRI liver 12/17/2020-interval contraction of the ablation site within the right hepatic lobe.  No new enhancing lesion to localize recurrence in the  right hepatic lobe.  Subtle new duct dilatation in the  right hepatic lobe which leads back to the same central region of increased metabolic activity on comparison FDG PET scan.  Combination of findings concerning for recurrent disease in the right hepatic lobe at the previous ablation site. Biopsy right liver mass near the site of prior ablation 02/14/2021-liver parenchyma with nonspecific changes and fragments of necrotic tissue SBRT 03/05/2021, 03/07/2021, 03/11/2021, 03/13/2021, 03/18/2021 MRI liver 06/03/2021-similar appearance of a 2.1 x 1.2 cm oval-shaped hypoenhancing lesion medially in the posterior right hepatic lobe associated with downstream biliary dilatation; this is the site of prior microwave ablation.  No definite abnormal enhancement.  No significant new liver lesions identified.  0.7 cm cystic lesion in the pancreatic head, possibly a postinflammatory lesion or small intraductal papillary mucinous neoplasm. MRI liver 01/14/2022-unchanged hypoenhancing lesion of the posterior right lobe of the liver measuring 2.0 x 1.3 cm, segmental biliary ductal dilatation posteriorly and inferiorly to the lesion with hyperemia of the liver parenchyma.  Findings consistent with ablated liver metastasis without MR evidence of residual disease.  No new liver lesions.  Hepatic steatosis.  Splenomegaly.  Unchanged 0.7 cm fluid signal cystic lesion of the ventral pancreatic uncinate. PET scan 05/14/2022-focal activity posterior right hepatic lobe which corresponds to hypoenhancing lesion on comparison MRI Biopsy liver lesion 06/16/2022-benign liver parenchyma with nonspecific inflammatory and fibrotic changes, no evidence of malignancy.  The fibrotic inflammatory changes are felt to most likely represent nonspecific changes next to a mass lesion. MRI liver 06/25/2022-enhancing lesion posteriorly in the right hepatic lobe corresponding with the area of hypermetabolic activity on PET CT.  This lies posteromedial to a previous  ablation defect and is consistent with local recurrence or new metastasis. Referred to Dr. Fayrene Helper at [facility], appointment scheduled 07/14/2022 CEA 234 on 08/11/2022 at [facility] CTs 08/14/2022-focal tumor recurrence posterior right hepatic lobe not well demonstrated on CT imaging; no evidence of new hepatic metastasis; no metastatic disease in the chest. Cycle 1 FOLFIRI/bevacizumab 09/01/2022   2.  Enlargement of the right testicle-hydrocele-Testicular ultrasound 02/26/2017-negative for mass, small bilateral hydroceles   3.  Hypertension   4.  Depression   5.  Family history of breast and ovarian cancer, negative genetic testing   6.  History of left leg edema, pain.   7.  Pigmented lesion right lower leg-refer to dermatology 06/30/2018   8.  Port-A-Cath placement 10/13/2018   9.  Thrombocytopenia secondary to chemotherapy   10.  Oxaliplatin neuropathy-trial of gabapentin 12/30/2019  11.  Diarrhea following cycle 1 FOLFIRI/bevacizumab  Disposition: [name] completed cycle 1 FOLFIRI/bevacizumab 09/01/2022.  Day 2 he developed diarrhea, partially improved with Kaopectate, but diarrhea has persisted.  He is weak.  He has had several falls recently.  Clinically he appears dehydrated.  We are holding chemotherapy today.  He will receive IV fluids and a dose of Imodium.  Following an initial dose of 2 tablets of Imodium we reviewed that he should take 1 Imodium tablet after each loose stool for a maximum of 8 tablets/day.  He understands to contact the office with persistent diarrhea and we will try Lomotil.  The black stools may be related to Kaopectate.  He will return for lab, follow-up, possible chemotherapy in 1 week.  He will contact the office in the interim as outlined above or with any other problems.    [name] ANP/GNP-BC   09/15/2022  9:33 AM

## 2022-09-15 NOTE — Patient Instructions (Signed)

## 2022-09-16 ENCOUNTER — Other Ambulatory Visit: Payer: Self-pay

## 2022-09-16 ENCOUNTER — Telehealth: Payer: Self-pay

## 2022-09-16 NOTE — Telephone Encounter (Signed)
Patient is not taking potassium. I suggest adding potassium to his diet: spinach, beets , or white bean. Patient gave verbal understanding and had no further question.

## 2022-09-16 NOTE — Telephone Encounter (Signed)
-----   Message from Owens Shark, NP sent at 09/16/2022  4:41 PM EDT ----- Is he taking potassium?

## 2022-09-17 ENCOUNTER — Telehealth: Payer: Self-pay

## 2022-09-17 ENCOUNTER — Inpatient Hospital Stay: Payer: Medicare Other

## 2022-09-17 ENCOUNTER — Other Ambulatory Visit: Payer: Self-pay | Admitting: Nurse Practitioner

## 2022-09-17 DIAGNOSIS — C186 Malignant neoplasm of descending colon: Secondary | ICD-10-CM

## 2022-09-17 MED ORDER — POTASSIUM CHLORIDE ER 10 MEQ PO CPCR: 10.0000 meq | ORAL_CAPSULE | Freq: Every day | ORAL | 2 refills | Status: DC

## 2022-09-17 NOTE — Telephone Encounter (Signed)
Patient gave verbal understanding and had no further questions or concerns  

## 2022-09-17 NOTE — Telephone Encounter (Signed)
-----   Message from Owens Shark, NP sent at 09/17/2022  8:02 AM EDT ----- Please let him know I sent a prescription to his pharmacy for potassium.  ----- Message ----- From: Velna Hatchet, LPN Sent: 90/30/0923   4:51 PM EDT To: Owens Shark, NP  No potassium. I suggest adding potassium to his diet ----- Message ----- From: Owens Shark, NP Sent: 09/16/2022   4:41 PM EDT To: [facility]  Is he taking potassium?

## 2022-09-23 ENCOUNTER — Inpatient Hospital Stay: Payer: Medicare Other

## 2022-09-23 ENCOUNTER — Telehealth: Payer: Self-pay

## 2022-09-23 ENCOUNTER — Other Ambulatory Visit: Payer: Self-pay

## 2022-09-23 ENCOUNTER — Inpatient Hospital Stay: Payer: Medicare Other | Attending: Oncology

## 2022-09-23 ENCOUNTER — Encounter: Payer: Self-pay | Admitting: Nurse Practitioner

## 2022-09-23 ENCOUNTER — Inpatient Hospital Stay (HOSPITAL_BASED_OUTPATIENT_CLINIC_OR_DEPARTMENT_OTHER): Payer: Medicare Other | Admitting: Nurse Practitioner

## 2022-09-23 VITALS — BP 142/77 | HR 68 | Resp 20

## 2022-09-23 VITALS — BP 141/79 | HR 80 | Temp 98.1°F | Resp 18 | Ht 74.0 in | Wt 255.6 lb

## 2022-09-23 DIAGNOSIS — R197 Diarrhea, unspecified: Secondary | ICD-10-CM | POA: Diagnosis not present

## 2022-09-23 DIAGNOSIS — E876 Hypokalemia: Secondary | ICD-10-CM | POA: Insufficient documentation

## 2022-09-23 DIAGNOSIS — C186 Malignant neoplasm of descending colon: Secondary | ICD-10-CM | POA: Diagnosis not present

## 2022-09-23 DIAGNOSIS — Z5112 Encounter for antineoplastic immunotherapy: Secondary | ICD-10-CM | POA: Insufficient documentation

## 2022-09-23 DIAGNOSIS — Z23 Encounter for immunization: Secondary | ICD-10-CM | POA: Diagnosis not present

## 2022-09-23 DIAGNOSIS — Z79899 Other long term (current) drug therapy: Secondary | ICD-10-CM | POA: Diagnosis not present

## 2022-09-23 DIAGNOSIS — D6959 Other secondary thrombocytopenia: Secondary | ICD-10-CM | POA: Diagnosis not present

## 2022-09-23 DIAGNOSIS — Z5111 Encounter for antineoplastic chemotherapy: Secondary | ICD-10-CM | POA: Insufficient documentation

## 2022-09-23 DIAGNOSIS — C787 Secondary malignant neoplasm of liver and intrahepatic bile duct: Secondary | ICD-10-CM | POA: Diagnosis not present

## 2022-09-23 LAB — CMP (CANCER CENTER ONLY)
ALT: 18 U/L (ref 0–44)
AST: 20 U/L (ref 15–41)
Albumin: 4.1 g/dL (ref 3.5–5.0)
Alkaline Phosphatase: 106 U/L (ref 38–126)
Anion gap: 12 (ref 5–15)
BUN: 20 mg/dL (ref 8–23)
CO2: 30 mmol/L (ref 22–32)
Calcium: 10.1 mg/dL (ref 8.9–10.3)
Chloride: 95 mmol/L — ABNORMAL LOW (ref 98–111)
Creatinine: 1.5 mg/dL — ABNORMAL HIGH (ref 0.61–1.24)
GFR, Estimated: 50 mL/min — ABNORMAL LOW (ref >60)
Glucose, Bld: 213 mg/dL — ABNORMAL HIGH (ref 70–99)
Potassium: 3.2 mmol/L — ABNORMAL LOW (ref 3.5–5.1)
Sodium: 137 mmol/L (ref 135–145)
Total Bilirubin: 0.8 mg/dL (ref 0.3–1.2)
Total Protein: 7.2 g/dL (ref 6.5–8.1)

## 2022-09-23 LAB — CBC WITH DIFFERENTIAL (CANCER CENTER ONLY)
Abs Immature Granulocytes: 0.25 10*3/uL — ABNORMAL HIGH (ref 0.00–0.07)
Basophils Absolute: 0.1 10*3/uL (ref 0.0–0.1)
Basophils Relative: 1 %
Eosinophils Absolute: 0.3 10*3/uL (ref 0.0–0.5)
Eosinophils Relative: 3 %
HCT: 36.8 % — ABNORMAL LOW (ref 39.0–52.0)
Hemoglobin: 12.6 g/dL — ABNORMAL LOW (ref 13.0–17.0)
Immature Granulocytes: 3 %
Lymphocytes Relative: 13 %
Lymphs Abs: 1.3 10*3/uL (ref 0.7–4.0)
MCH: 28.6 pg (ref 26.0–34.0)
MCHC: 34.2 g/dL (ref 30.0–36.0)
MCV: 83.6 fL (ref 80.0–100.0)
Monocytes Absolute: 0.7 10*3/uL (ref 0.1–1.0)
Monocytes Relative: 7 %
Neutro Abs: 7.3 10*3/uL (ref 1.7–7.7)
Neutrophils Relative %: 73 %
Platelet Count: 225 10*3/uL (ref 150–400)
RBC: 4.4 MIL/uL (ref 4.22–5.81)
RDW: 14.5 % (ref 11.5–15.5)
WBC Count: 9.9 10*3/uL (ref 4.0–10.5)
nRBC: 0 % (ref 0.0–0.2)

## 2022-09-23 LAB — TOTAL PROTEIN, URINE DIPSTICK: Protein, ur: 30 mg/dL — AB

## 2022-09-23 LAB — MAGNESIUM: Magnesium: 1.3 mg/dL — ABNORMAL LOW (ref 1.7–2.4)

## 2022-09-23 MED ORDER — SODIUM CHLORIDE 0.9 % IV SOLN
200.0000 mg/m2 | Freq: Once | INTRAVENOUS | Status: AC
  Administered 2022-09-23: 492 mg via INTRAVENOUS
  Filled 2022-09-23: qty 24.6

## 2022-09-23 MED ORDER — SODIUM CHLORIDE 0.9 % IV SOLN
135.0000 mg/m2 | Freq: Once | INTRAVENOUS | Status: AC
  Administered 2022-09-23: 340 mg via INTRAVENOUS
  Filled 2022-09-23: qty 2

## 2022-09-23 MED ORDER — SODIUM CHLORIDE 0.9 % IV SOLN
1800.0000 mg/m2 | INTRAVENOUS | Status: DC
  Administered 2022-09-23: 4450 mg via INTRAVENOUS
  Filled 2022-09-23: qty 89

## 2022-09-23 MED ORDER — PALONOSETRON HCL INJECTION 0.25 MG/5ML
0.2500 mg | Freq: Once | INTRAVENOUS | Status: AC
  Administered 2022-09-23: 0.25 mg via INTRAVENOUS
  Filled 2022-09-23: qty 5

## 2022-09-23 MED ORDER — SODIUM CHLORIDE 0.9 % IV SOLN
10.0000 mg | Freq: Once | INTRAVENOUS | Status: AC
  Administered 2022-09-23: 10 mg via INTRAVENOUS
  Filled 2022-09-23: qty 1

## 2022-09-23 MED ORDER — FLUOROURACIL CHEMO INJECTION 500 MG/10ML
200.0000 mg/m2 | Freq: Once | INTRAVENOUS | Status: AC
  Administered 2022-09-23: 500 mg via INTRAVENOUS
  Filled 2022-09-23: qty 10

## 2022-09-23 MED ORDER — SODIUM CHLORIDE 0.9 % IV SOLN
5.0000 mg/kg | Freq: Once | INTRAVENOUS | Status: AC
  Administered 2022-09-23: 600 mg via INTRAVENOUS
  Filled 2022-09-23: qty 8

## 2022-09-23 MED ORDER — SODIUM CHLORIDE 0.9 % IV SOLN: Freq: Once | INTRAVENOUS | Status: AC

## 2022-09-23 MED ORDER — ATROPINE SULFATE 1 MG/ML IV SOLN
0.5000 mg | Freq: Once | INTRAVENOUS | Status: AC | PRN
  Administered 2022-09-23: 0.5 mg via INTRAVENOUS
  Filled 2022-09-23: qty 1

## 2022-09-23 NOTE — Telephone Encounter (Signed)
Adding magnesium lab. I spoke with Jeremy Stevenson.

## 2022-09-23 NOTE — Telephone Encounter (Signed)
-----   Message from Owens Shark, NP sent at 09/23/2022  2:06 PM EST ----- Can he get IV mag today or when here on Thursday for pump dc?

## 2022-09-23 NOTE — Telephone Encounter (Signed)
Patient is schedule for IV mag and the order is in.

## 2022-09-23 NOTE — Progress Notes (Unsigned)
Patient seen by [name] NP today  Vitals are within treatment parameters.  Labs reviewed by [name] NP and are not all within treatment parameters. Creatinine is 1.5. Per Lattie Haw it is okay to [facility]  Per physician team, patient is ready for treatment. Please note that modifications are being made to the treatment plan including chemo reduced dose.

## 2022-09-23 NOTE — Patient Instructions (Signed)
[facility]   Discharge Instructions: Thank you for choosing [facility] to provide your oncology and hematology care.   If you have a lab appointment with the [facility], please go directly to the [facility] and check in at the registration area.   Wear comfortable clothing and clothing appropriate for easy access to any Portacath or PICC line.   We strive to give you quality time with your provider. You may need to reschedule your appointment if you arrive late (15 or more minutes).  Arriving late affects you and other patients whose appointments are after yours.  Also, if you miss three or more appointments without notifying the office, you may be dismissed from the clinic at the provider's discretion.      For prescription refill requests, have your pharmacy contact our office and allow 72 hours for refills to be completed.    Today you received the following chemotherapy and/or immunotherapy agents Bevacizumab-bvzr (ZIRABEV), Irinotecan (CAMPTOSAR), Leucovorin & Flourouracil (ADRUCIL).      To help prevent nausea and vomiting after your treatment, we encourage you to take your nausea medication as directed.  BELOW ARE SYMPTOMS THAT SHOULD BE REPORTED IMMEDIATELY: *FEVER GREATER THAN 100.4 F (38 C) OR HIGHER *CHILLS OR SWEATING *NAUSEA AND VOMITING THAT IS NOT CONTROLLED WITH YOUR NAUSEA MEDICATION *UNUSUAL SHORTNESS OF BREATH *UNUSUAL BRUISING OR BLEEDING *URINARY PROBLEMS (pain or burning when urinating, or frequent urination) *BOWEL PROBLEMS (unusual diarrhea, constipation, pain near the anus) TENDERNESS IN MOUTH AND THROAT WITH OR WITHOUT PRESENCE OF ULCERS (sore throat, sores in mouth, or a toothache) UNUSUAL RASH, SWELLING OR PAIN  UNUSUAL VAGINAL DISCHARGE OR ITCHING   Items with * indicate a potential emergency and should be followed up as soon as possible or go to the Emergency Department if any problems should occur.  Please  show the CHEMOTHERAPY ALERT CARD or IMMUNOTHERAPY ALERT CARD at check-in to the Emergency Department and triage nurse.  Should you have questions after your visit or need to cancel or reschedule your appointment, please contact [facility]  Dept: [phone]  and follow the prompts.  Office hours are 8:00 a.m. to 4:30 p.m. Monday - Friday. Please note that voicemails left after 4:00 p.m. may not be returned until the following business day.  We are closed weekends and major holidays. You have access to a nurse at all times for urgent questions. Please call the main number to the clinic Dept: [phone] and follow the prompts.   For any non-urgent questions, you may also contact your provider using MyChart. We now offer e-Visits for anyone 2 and older to request care online for non-urgent symptoms. For details visit [email or web address].   Also download the MyChart app! Go to the app store, search "MyChart", open the app, select [facility], and log in with your MyChart username and password.  Masks are optional in the cancer centers. If you would like for your care team to wear a mask while they are taking care of you, please let them know. You may have one support person who is at least 69 years old accompany you for your appointments.  Bevacizumab Injection What is this medication? BEVACIZUMAB (be va SIZ yoo mab) treats some types of cancer. It works by blocking a protein that causes cancer cells to grow and multiply. This helps to slow or stop the spread of cancer cells. It is a monoclonal antibody. This medicine may be used for  other purposes; ask your health care provider or pharmacist if you have questions. COMMON BRAND NAME(S): Alymsys, Avastin, MVASI, Noah Charon What should I tell my care team before I take this medication? They need to know if you have any of these conditions: Blood clots Coughing up blood Having or recent surgery Heart failure High blood  pressure History of a connection between 2 or more body parts that do not usually connect (fistula) History of a tear in your stomach or intestines Protein in your urine An unusual or allergic reaction to bevacizumab, other medications, foods, dyes, or preservatives Pregnant or trying to get pregnant Breast-feeding How should I use this medication? This medication is injected into a vein. It is given by your care team in a hospital or clinic setting. Talk to your care team the use of this medication in children. Special care may be needed. Overdosage: If you think you have taken too much of this medicine contact a poison control center or emergency room at once. NOTE: This medicine is only for you. Do not share this medicine with others. What if I miss a dose? Keep appointments for follow-up doses. It is important not to miss your dose. Call your care team if you are unable to keep an appointment. What may interact with this medication? Interactions are not expected. This list may not describe all possible interactions. Give your health care provider a list of all the medicines, herbs, non-prescription drugs, or dietary supplements you use. Also tell them if you smoke, drink alcohol, or use illegal drugs. Some items may interact with your medicine. What should I watch for while using this medication? Your condition will be monitored carefully while you are receiving this medication. You may need blood work while taking this medication. This medication may make you feel generally unwell. This is not uncommon as chemotherapy can affect healthy cells as well as cancer cells. Report any side effects. Continue your course of treatment even though you feel ill unless your care team tells you to stop. This medication may increase your risk to bruise or bleed. Call your care team if you notice any unusual bleeding. Before having surgery, talk to your care team to make sure it is ok. This medication can  increase the risk of poor healing of your surgical site or wound. You will need to stop this medication for 28 days before surgery. After surgery, wait at least 28 days before restarting this medication. Make sure the surgical site or wound is healed enough before restarting this medication. Talk to your care team if questions. Talk to your care team if you may be pregnant. Serious birth defects can occur if you take this medication during pregnancy and for 6 months after the last dose. Contraception is recommended while taking this medication and for 6 months after the last dose. Your care team can help you find the option that works for you. Do not breastfeed while taking this medication and for 6 months after the last dose. This medication can cause infertility. Talk to your care team if you are concerned about your fertility. What side effects may I notice from receiving this medication? Side effects that you should report to your care team as soon as possible: Allergic reactions--skin rash, itching, hives, swelling of the face, lips, tongue, or throat Bleeding--bloody or black, tar-like stools, vomiting blood or brown material that looks like coffee grounds, red or dark brown urine, small red or purple spots on skin, unusual bruising or bleeding  Blood clot--pain, swelling, or warmth in the leg, shortness of breath, chest pain Heart attack--pain or tightness in the chest, shoulders, arms, or jaw, nausea, shortness of breath, cold or clammy skin, feeling faint or lightheaded Heart failure--shortness of breath, swelling of the ankles, feet, or hands, sudden weight gain, unusual weakness or fatigue Increase in blood pressure Infection--fever, chills, cough, sore throat, wounds that don't heal, pain or trouble when passing urine, general feeling of discomfort or being unwell Infusion reactions--chest pain, shortness of breath or trouble breathing, feeling faint or lightheaded Kidney injury--decrease in  the amount of urine, swelling of the ankles, hands, or feet Stomach pain that is severe, does not go away, or gets worse Stroke--sudden numbness or weakness of the face, arm, or leg, trouble speaking, confusion, trouble walking, loss of balance or coordination, dizziness, severe headache, change in vision Sudden and severe headache, confusion, change in vision, seizures, which may be signs of posterior reversible encephalopathy syndrome (PRES) Side effects that usually do not require medical attention (report to your care team if they continue or are bothersome): Back pain Change in taste Diarrhea Dry skin Increased tears Nosebleed This list may not describe all possible side effects. Call your doctor for medical advice about side effects. You may report side effects to FDA at 1-800-FDA-1088. Where should I keep my medication? This medication is given in a hospital or clinic. It will not be stored at home. NOTE: This sheet is a summary. It may not cover all possible information. If you have questions about this medicine, talk to your doctor, pharmacist, or health care provider.  2023 Elsevier/Gold Standard (2022-03-07 00:00:00)  Irinotecan Injection What is this medication? IRINOTECAN (ir in oh TEE kan) treats some types of cancer. It works by slowing down the growth of cancer cells. This medicine may be used for other purposes; ask your health care provider or pharmacist if you have questions. COMMON BRAND NAME(S): Camptosar What should I tell my care team before I take this medication? They need to know if you have any of these conditions: Dehydration Diarrhea Infection, especially a viral infection, such as chickenpox, cold sores, herpes Liver disease Low blood cell levels (white cells, red cells, and platelets) Low levels of electrolytes, such as calcium, magnesium, or potassium in your blood Recent or ongoing radiation An unusual or allergic reaction to irinotecan, other  medications, foods, dyes, or preservatives If you or your partner are pregnant or trying to get pregnant Breast-feeding How should I use this medication? This medication is injected into a vein. It is given by your care team in a hospital or clinic setting. Talk to your care team about the use of this medication in children. Special care may be needed. Overdosage: If you think you have taken too much of this medicine contact a poison control center or emergency room at once. NOTE: This medicine is only for you. Do not share this medicine with others. What if I miss a dose? Keep appointments for follow-up doses. It is important not to miss your dose. Call your care team if you are unable to keep an appointment. What may interact with this medication? Do not take this medication with any of the following: Cobicistat Itraconazole This medication may also interact with the following: Certain antibiotics, such as clarithromycin, rifampin, rifabutin Certain antivirals for HIV or AIDS Certain medications for fungal infections, such as ketoconazole, posaconazole, voriconazole Certain medications for seizures, such as carbamazepine, phenobarbital, phenytoin Gemfibrozil Nefazodone St. John's wort This list may  not describe all possible interactions. Give your health care provider a list of all the medicines, herbs, non-prescription drugs, or dietary supplements you use. Also tell them if you smoke, drink alcohol, or use illegal drugs. Some items may interact with your medicine. What should I watch for while using this medication? Your condition will be monitored carefully while you are receiving this medication. You may need blood work while taking this medication. This medication may make you feel generally unwell. This is not uncommon as chemotherapy can affect healthy cells as well as cancer cells. Report any side effects. Continue your course of treatment even though you feel ill unless your care  team tells you to stop. This medication can cause serious side effects. To reduce the risk, your care team may give you other medications to take before receiving this one. Be sure to follow the directions from your care team. This medication may affect your coordination, reaction time, or judgement. Do not drive or operate machinery until you know how this medication affects you. Sit up or stand slowly to reduce the risk of dizzy or fainting spells. Drinking alcohol with this medication can increase the risk of these side effects. This medication may increase your risk of getting an infection. Call your care team for advice if you get a fever, chills, sore throat, or other symptoms of a cold or flu. Do not treat yourself. Try to avoid being around people who are sick. Avoid taking medications that contain aspirin, acetaminophen, ibuprofen, naproxen, or ketoprofen unless instructed by your care team. These medications may hide a fever. This medication may increase your risk to bruise or bleed. Call your care team if you notice any unusual bleeding. Be careful brushing or flossing your teeth or using a toothpick because you may get an infection or bleed more easily. If you have any dental work done, tell your dentist you are receiving this medication. Talk to your care team if you or your partner are pregnant or think either of you might be pregnant. This medication can cause serious birth defects if taken during pregnancy and for 6 months after the last dose. You will need a negative pregnancy test before starting this medication. Contraception is recommended while taking this medication and for 6 months after the last dose. Your care team can help you find the option that works for you. Do not father a child while taking this medication and for 3 months after the last dose. Use a condom for contraception during this time period. Do not breastfeed while taking this medication and for 7 days after the last  dose. This medication may cause infertility. Talk to your care team if you are concerned about your fertility. What side effects may I notice from receiving this medication? Side effects that you should report to your care team as soon as possible: Allergic reactions--skin rash, itching, hives, swelling of the face, lips, tongue, or throat Dry cough, shortness of breath or trouble breathing Increased saliva or tears, increased sweating, stomach cramping, diarrhea, small pupils, unusual weakness or fatigue, slow heartbeat Infection--fever, chills, cough, sore throat, wounds that don't heal, pain or trouble when passing urine, general feeling of discomfort or being unwell Kidney injury--decrease in the amount of urine, swelling of the ankles, hands, or feet Low red blood cell level--unusual weakness or fatigue, dizziness, headache, trouble breathing Severe or prolonged diarrhea Unusual bruising or bleeding Side effects that usually do not require medical attention (report to your care team if they  continue or are bothersome): Constipation Diarrhea Hair loss Loss of appetite Nausea Stomach pain This list may not describe all possible side effects. Call your doctor for medical advice about side effects. You may report side effects to FDA at 1-800-FDA-1088. Where should I keep my medication? This medication is given in a hospital or clinic. It will not be stored at home. NOTE: This sheet is a summary. It may not cover all possible information. If you have questions about this medicine, talk to your doctor, pharmacist, or health care provider.  2023 Elsevier/Gold Standard (2022-03-13 00:00:00)  Leucovorin Injection What is this medication? LEUCOVORIN (loo koe VOR in) prevents side effects from certain medications, such as methotrexate. It works by increasing folate levels. This helps protect healthy cells in your body. It may also be used to treat anemia caused by low levels of folate. It can  also be used with fluorouracil, a type of chemotherapy, to treat colorectal cancer. It works by increasing the effects of fluorouracil in the body. This medicine may be used for other purposes; ask your health care provider or pharmacist if you have questions. What should I tell my care team before I take this medication? They need to know if you have any of these conditions: Anemia from low levels of vitamin B12 in the blood An unusual or allergic reaction to leucovorin, folic acid, other medications, foods, dyes, or preservatives Pregnant or trying to get pregnant Breastfeeding How should I use this medication? This medication is injected into a vein or a muscle. It is given by your care team in a hospital or clinic setting. Talk to your care team about the use of this medication in children. Special care may be needed. Overdosage: If you think you have taken too much of this medicine contact a poison control center or emergency room at once. NOTE: This medicine is only for you. Do not share this medicine with others. What if I miss a dose? Keep appointments for follow-up doses. It is important not to miss your dose. Call your care team if you are unable to keep an appointment. What may interact with this medication? Capecitabine Fluorouracil Phenobarbital Phenytoin Primidone Trimethoprim;sulfamethoxazole This list may not describe all possible interactions. Give your health care provider a list of all the medicines, herbs, non-prescription drugs, or dietary supplements you use. Also tell them if you smoke, drink alcohol, or use illegal drugs. Some items may interact with your medicine. What should I watch for while using this medication? Your condition will be monitored carefully while you are receiving this medication. This medication may increase the side effects of 5-fluorouracil. Tell your care team if you have diarrhea or mouth sores that do not get better or that get worse. What  side effects may I notice from receiving this medication? Side effects that you should report to your care team as soon as possible: Allergic reactions--skin rash, itching, hives, swelling of the face, lips, tongue, or throat This list may not describe all possible side effects. Call your doctor for medical advice about side effects. You may report side effects to FDA at 1-800-FDA-1088. Where should I keep my medication? This medication is given in a hospital or clinic. It will not be stored at home. NOTE: This sheet is a summary. It may not cover all possible information. If you have questions about this medicine, talk to your doctor, pharmacist, or health care provider.  2023 Elsevier/Gold Standard (2022-04-08 00:00:00)  Fluorouracil Injection What is this medication? FLUOROURACIL (  flure oh YOOR a sil) treats some types of cancer. It works by slowing down the growth of cancer cells. This medicine may be used for other purposes; ask your health care provider or pharmacist if you have questions. COMMON BRAND NAME(S): Adrucil What should I tell my care team before I take this medication? They need to know if you have any of these conditions: Blood disorders Dihydropyrimidine dehydrogenase (DPD) deficiency Infection, such as chickenpox, cold sores, herpes Kidney disease Liver disease Poor nutrition Recent or ongoing radiation therapy An unusual or allergic reaction to fluorouracil, other medications, foods, dyes, or preservatives If you or your partner are pregnant or trying to get pregnant Breast-feeding How should I use this medication? This medication is injected into a vein. It is administered by your care team in a hospital or clinic setting. Talk to your care team about the use of this medication in children. Special care may be needed. Overdosage: If you think you have taken too much of this medicine contact a poison control center or emergency room at once. NOTE: This medicine is  only for you. Do not share this medicine with others. What if I miss a dose? Keep appointments for follow-up doses. It is important not to miss your dose. Call your care team if you are unable to keep an appointment. What may interact with this medication? Do not take this medication with any of the following: Live virus vaccines This medication may also interact with the following: Medications that treat or prevent blood clots, such as warfarin, enoxaparin, dalteparin This list may not describe all possible interactions. Give your health care provider a list of all the medicines, herbs, non-prescription drugs, or dietary supplements you use. Also tell them if you smoke, drink alcohol, or use illegal drugs. Some items may interact with your medicine. What should I watch for while using this medication? Your condition will be monitored carefully while you are receiving this medication. This medication may make you feel generally unwell. This is not uncommon as chemotherapy can affect healthy cells as well as cancer cells. Report any side effects. Continue your course of treatment even though you feel ill unless your care team tells you to stop. In some cases, you may be given additional medications to help with side effects. Follow all directions for their use. This medication may increase your risk of getting an infection. Call your care team for advice if you get a fever, chills, sore throat, or other symptoms of a cold or flu. Do not treat yourself. Try to avoid being around people who are sick. This medication may increase your risk to bruise or bleed. Call your care team if you notice any unusual bleeding. Be careful brushing or flossing your teeth or using a toothpick because you may get an infection or bleed more easily. If you have any dental work done, tell your dentist you are receiving this medication. Avoid taking medications that contain aspirin, acetaminophen, ibuprofen, naproxen, or  ketoprofen unless instructed by your care team. These medications may hide a fever. Do not treat diarrhea with over the counter products. Contact your care team if you have diarrhea that lasts more than 2 days or if it is severe and watery. This medication can make you more sensitive to the sun. Keep out of the sun. If you cannot avoid being in the sun, wear protective clothing and sunscreen. Do not use sun lamps, tanning beds, or tanning booths. Talk to your care team if you or  your partner wish to become pregnant or think you might be pregnant. This medication can cause serious birth defects if taken during pregnancy and for 3 months after the last dose. A reliable form of contraception is recommended while taking this medication and for 3 months after the last dose. Talk to your care team about effective forms of contraception. Do not father a child while taking this medication and for 3 months after the last dose. Use a condom while having sex during this time period. Do not breastfeed while taking this medication. This medication may cause infertility. Talk to your care team if you are concerned about your fertility. What side effects may I notice from receiving this medication? Side effects that you should report to your care team as soon as possible: Allergic reactions--skin rash, itching, hives, swelling of the face, lips, tongue, or throat Heart attack--pain or tightness in the chest, shoulders, arms, or jaw, nausea, shortness of breath, cold or clammy skin, feeling faint or lightheaded Heart failure--shortness of breath, swelling of the ankles, feet, or hands, sudden weight gain, unusual weakness or fatigue Heart rhythm changes--fast or irregular heartbeat, dizziness, feeling faint or lightheaded, chest pain, trouble breathing High ammonia level--unusual weakness or fatigue, confusion, loss of appetite, nausea, vomiting, seizures Infection--fever, chills, cough, sore throat, wounds that don't  heal, pain or trouble when passing urine, general feeling of discomfort or being unwell Low red blood cell level--unusual weakness or fatigue, dizziness, headache, trouble breathing Pain, tingling, or numbness in the hands or feet, muscle weakness, change in vision, confusion or trouble speaking, loss of balance or coordination, trouble walking, seizures Redness, swelling, and blistering of the skin over hands and feet Severe or prolonged diarrhea Unusual bruising or bleeding Side effects that usually do not require medical attention (report to your care team if they continue or are bothersome): Dry skin Headache Increased tears Nausea Pain, redness, or swelling with sores inside the mouth or throat Sensitivity to light Vomiting This list may not describe all possible side effects. Call your doctor for medical advice about side effects. You may report side effects to FDA at 1-800-FDA-1088. Where should I keep my medication? This medication is given in a hospital or clinic. It will not be stored at home. NOTE: This sheet is a summary. It may not cover all possible information. If you have questions about this medicine, talk to your doctor, pharmacist, or health care provider.  2023 Elsevier/Gold Standard (2022-03-04 00:00:00)  The chemotherapy medication bag should finish at 46 hours, 96 hours, or 7 days. For example, if your pump is scheduled for 46 hours and it was put on at 4:00 p.m., it should finish at 2:00 p.m. the day it is scheduled to come off regardless of your appointment time.     Estimated time to finish at 10:30 a.m. on 09/25/2022.   If the display on your pump reads "Low Volume" and it is beeping, take the batteries out of the pump and come to the cancer center for it to be taken off.   If the pump alarms go off prior to the pump reading "Low Volume" then call [phone] and someone can assist you.  If the plunger comes out and the chemotherapy medication is leaking  out, please use your home chemo spill kit to clean up the spill. Do NOT use paper towels or other household products.  If you have problems or questions regarding your pump, please call either [phone] (24 hours a day) or the cancer  center Monday-Friday 8:00 a.m.- 4:30 p.m. at the clinic number and we will assist you. If you are unable to get assistance, then go to the nearest Emergency Department and ask the staff to contact the IV team for assistance.

## 2022-09-23 NOTE — Progress Notes (Unsigned)
[place] OFFICE PROGRESS NOTE   Diagnosis: Colon cancer  INTERVAL HISTORY:   Jeremy Stevenson returns as scheduled.  He completed cycle 1 FOLFIRI/bevacizumab 09/01/2022.  Cycle 2 was held 09/15/2022 due to diarrhea/dehydration.  He is feeling better.  No further falls.  Periodic loose stools.  He had a large bowel movement this morning, partially formed.  He had no bowel movement yesterday.  He discontinued Kaopectate.  He is no longer having black stools.  He denies bleeding.  No mouth sores.  No nausea or vomiting.  Objective:  Vital signs in last 24 hours:  Blood pressure (!) 141/79, pulse 80, temperature 98.1 F (36.7 C), temperature source Oral, resp. rate 18, height _0  (1.88 m), weight 255 lb 9.6 oz (115.9 kg), SpO2 98 %.    HEENT: No thrush or ulcers.  Mucous membranes appear moist. Resp: Lungs clear bilaterally. Cardio: Regular rate and rhythm. GI: Abdomen soft and nontender.  No hepatomegaly. Vascular: No leg edema. Neuro: Alert and oriented. Skin: Palms without erythema. Port-A-Cath without erythema.   Lab Results:  Lab Results  Component Value Date   WBC 9.9 09/23/2022   HGB 12.6 (L) 09/23/2022   HCT 36.8 (L) 09/23/2022   MCV 83.6 09/23/2022   PLT 225 09/23/2022   NEUTROABS 7.3 09/23/2022    Imaging:  No results found.  Medications: I have reviewed the patient's current medications.  Assessment/Plan: Adenocarcinoma of the descending colon, stage II (T3 N0), status post a left colectomy 01/20/2017 MSI-stable, no loss of mismatch repair protein expression Elevated preoperative CEA Incomplete preoperative colonoscopy Colonoscopy 08/07/2017-multiple polyps removed including tubular adenomas CT abdomen/pelvis 02/05/2018-no evidence of metastatic disease. Elevated CEA June 2019 CTs 06/29/2018-no evidence of metastatic disease, stable mild bilateral iliac adenopathy PET scan 08/03/2018- solitary hypermetabolic central right liver metastasis;  asymmetric hypermetabolism right palatine tonsil with associated mild asymmetric soft tissue fullness on CT images MRI liver 08/25/2018- 2.6 cm mass in the central right liver consistent with metastasis, no other evidence of metastatic disease Ablation and biopsy of right liver lesion 09/29/2018-pathology revealed adenocarcinoma; foundation 1-KRAS G12V, NRAS wildtype, microsatellite stable, tumor mutational burden 1 Cycle 1 FOLFOX 10/25/2018 Cycle 2 FOLFOX 11/09/2018 Cycle 3 FOLFOX 11/23/2018 Cycle 4 FOLFOX 12/07/2018 Cycle 5 FOLFOX 12/21/2018 (oxaliplatin dose reduced secondary to thrombocytopenia) Cycle 6 FOLFOX 01/04/2019 Cycle 7 FOLFOX 01/18/2019 Cycle 8 FOLFOX 02/01/2019 Cycle 9 FOLFOX 02/15/2019 MRI abdomen 02/24/2019- ablation site in the liver without residual tumor identified.  Mild intrahepatic biliary dilatation distal to the ablation site.  Diffuse hepatic steatosis.  No new liver lesions identified. Cycle 10 FOLFOX 02/28/2019 (oxaliplatin held due to neuropathy and thrombocytopenia) Cycle 11 FOLFOX 03/14/2019 (oxaliplatin eliminated from the regimen due to neuropathy, 5-fluorouracil dose adjusted due to diarrhea and tearing) Cycle 12 FOLFOX 03/28/2019 (no oxaliplatin, 5-fluorouracil as per treatment 03/14/2019) CTs 09/08/2019-no evidence of recurrent disease, no evidence of recurrent disease at the hepatic ablation site, stable renal cysts CT abdomen/pelvis 03/06/2020-stable ablation site, no evidence of disease progression, stable gastric lipoma Colonoscopy 05/03/2020-polyp removed from the cecum-hyperplastic polyp Elevated CEA 09/06/2020 CTs 09/06/2020-no evidence of recurrent disease, stable ablation change in the right liver Further elevation of CEA 11/12/2020 PET 11/22/2020- small focus of hypermetabolism in the medial right liver at the previous ablation site, no other evidence of metastatic disease MRI liver 12/17/2020-interval contraction of the ablation site within the right hepatic lobe.  No  new enhancing lesion to localize recurrence in the right hepatic lobe.  Subtle new duct dilatation in the right  hepatic lobe which leads back to the same central region of increased metabolic activity on comparison FDG PET scan.  Combination of findings concerning for recurrent disease in the right hepatic lobe at the previous ablation site. Biopsy right liver mass near the site of prior ablation 02/14/2021-liver parenchyma with nonspecific changes and fragments of necrotic tissue SBRT 03/05/2021, 03/07/2021, 03/11/2021, 03/13/2021, 03/18/2021 MRI liver 06/03/2021-similar appearance of a 2.1 x 1.2 cm oval-shaped hypoenhancing lesion medially in the posterior right hepatic lobe associated with downstream biliary dilatation; this is the site of prior microwave ablation.  No definite abnormal enhancement.  No significant new liver lesions identified.  0.7 cm cystic lesion in the pancreatic head, possibly a postinflammatory lesion or small intraductal papillary mucinous neoplasm. MRI liver 01/14/2022-unchanged hypoenhancing lesion of the posterior right lobe of the liver measuring 2.0 x 1.3 cm, segmental biliary ductal dilatation posteriorly and inferiorly to the lesion with hyperemia of the liver parenchyma.  Findings consistent with ablated liver metastasis without MR evidence of residual disease.  No new liver lesions.  Hepatic steatosis.  Splenomegaly.  Unchanged 0.7 cm fluid signal cystic lesion of the ventral pancreatic uncinate. PET scan 05/14/2022-focal activity posterior right hepatic lobe which corresponds to hypoenhancing lesion on comparison MRI Biopsy liver lesion 06/16/2022-benign liver parenchyma with nonspecific inflammatory and fibrotic changes, no evidence of malignancy.  The fibrotic inflammatory changes are felt to most likely represent nonspecific changes next to a mass lesion. MRI liver 06/25/2022-enhancing lesion posteriorly in the right hepatic lobe corresponding with the area of hypermetabolic  activity on PET CT.  This lies posteromedial to a previous ablation defect and is consistent with local recurrence or new metastasis. Referred to Dr. Fayrene Helper at [facility], appointment scheduled 07/14/2022 CEA 234 on 08/11/2022 at [facility] CTs 08/14/2022-focal tumor recurrence posterior right hepatic lobe not well demonstrated on CT imaging; no evidence of new hepatic metastasis; no metastatic disease in the chest. Cycle 1 FOLFIRI/bevacizumab 09/01/2022 Cycle 2 FOLFIRI/bevacizumab 09/23/2022, irinotecan and 5-fluorouracil dose reduced due to diarrhea following cycle 1   2.  Enlargement of the right testicle-hydrocele-Testicular ultrasound 02/26/2017-negative for mass, small bilateral hydroceles   3.  Hypertension   4.  Depression   5.  Family history of breast and ovarian cancer, negative genetic testing   6.  History of left leg edema, pain.   7.  Pigmented lesion right lower leg-refer to dermatology 06/30/2018   8.  Port-A-Cath placement 10/13/2018   9.  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  Diarrhea following cycle 1 FOLFIRI/bevacizumab  Disposition: Jeremy Stevenson appears stable.  He has completed 1 cycle of FOLFIRI/bevacizumab.  Course was complicated by diarrhea.  Cycle 2 was held for 1 week.  Plan to proceed with cycle 2 FOLFIRI/bevacizumab today as scheduled.  Chemotherapy doses adjusted due to diarrhea and weight loss.  CBC and chemistry panel reviewed.  Labs adequate to proceed with treatment.  He has persistent hypokalemia.  He will increase oral potassium to 10 mEq twice daily for the next 3 days, then once daily.  We will check a magnesium level.  Creatinine with stable elevation.  He will return for lab, follow-up, cycle 3 FOLFIRI/bevacizumab in 2 weeks.  He will contact the office in the interim with any problems.  We specifically discussed poorly controlled diarrhea.  Patient seen with Dr. Benay Spice.    [name] ANP/GNP-BC   09/23/2022  9:24 AM This was a shared visit with [name].  Mr.  Stevenson appears well today.  Cycle 2 FOLFIRI/bevacizumab was delayed secondary to diarrhea following cycle 1.  The chemotherapy doses were adjusted today for his weight loss and toxicity.  He will complete another cycle of FOLFIRI/bevacizumab today.  I was present for greater than 50% of today's visit.  I performed medical decision making.  Julieanne Manson, MD

## 2022-09-24 ENCOUNTER — Encounter: Payer: Self-pay | Admitting: Oncology

## 2022-09-25 ENCOUNTER — Other Ambulatory Visit: Payer: Self-pay

## 2022-09-25 ENCOUNTER — Inpatient Hospital Stay: Payer: Medicare Other

## 2022-09-25 VITALS — BP 116/74 | HR 74 | Temp 98.1°F | Resp 20

## 2022-09-25 DIAGNOSIS — Z5112 Encounter for antineoplastic immunotherapy: Secondary | ICD-10-CM | POA: Diagnosis not present

## 2022-09-25 DIAGNOSIS — Z5111 Encounter for antineoplastic chemotherapy: Secondary | ICD-10-CM | POA: Diagnosis not present

## 2022-09-25 DIAGNOSIS — C186 Malignant neoplasm of descending colon: Secondary | ICD-10-CM

## 2022-09-25 DIAGNOSIS — Z23 Encounter for immunization: Secondary | ICD-10-CM | POA: Diagnosis not present

## 2022-09-25 DIAGNOSIS — D6959 Other secondary thrombocytopenia: Secondary | ICD-10-CM | POA: Diagnosis not present

## 2022-09-25 DIAGNOSIS — C787 Secondary malignant neoplasm of liver and intrahepatic bile duct: Secondary | ICD-10-CM | POA: Diagnosis not present

## 2022-09-25 MED ORDER — HEPARIN SOD (PORK) LOCK FLUSH 100 UNIT/ML IV SOLN
500.0000 [IU] | Freq: Once | INTRAVENOUS | Status: AC | PRN
  Administered 2022-09-25: 500 [IU]

## 2022-09-25 MED ORDER — MAGNESIUM SULFATE 4 GM/100ML IV SOLN
4.0000 g | Freq: Once | INTRAVENOUS | Status: AC
  Administered 2022-09-25: 4 g via INTRAVENOUS
  Filled 2022-09-25: qty 100

## 2022-09-25 MED ORDER — SODIUM CHLORIDE 0.9% FLUSH
10.0000 mL | INTRAVENOUS | Status: DC | PRN
  Administered 2022-09-25: 10 mL

## 2022-09-25 MED ORDER — SODIUM CHLORIDE 0.9 % IV SOLN: Freq: Once | INTRAVENOUS | Status: AC

## 2022-09-25 NOTE — Progress Notes (Signed)
Patient here for pump stop and Magnesium. Blood pressure noted to be lower than usual all other VS normal.  Patient is not symptomatic.  3 stools yesterday and 3 episodes of vomiting. Pt reports eating and drinking. [name], NP notified.

## 2022-09-25 NOTE — Patient Instructions (Signed)
Implanted Port Home Guide An implanted port is a device that is placed under the skin. It is usually placed in the chest. The device may vary based on the need. Implanted ports can be used to give IV medicine, to take blood, or to give fluids. You may have an implanted port if: You need IV medicine that would be irritating to the small veins in your hands or arms. You need IV medicines, such as chemotherapy, for a long period of time. You need IV nutrition for a long period of time. You may have fewer limitations when using a port than you would if you used other types of long-term IVs. You will also likely be able to return to normal activities after your incision heals. An implanted port has two main parts: Reservoir. The reservoir is the part where a needle is inserted to give medicines or draw blood. The reservoir is round. After the port is placed, it appears as a small, raised area under your skin. Catheter. The catheter is a small, thin tube that connects the reservoir to a vein. Medicine that is inserted into the reservoir goes into the catheter and then into the vein. How is my port accessed? To access your port: A numbing cream may be placed on the skin over the port site. Your health care provider will put on a mask and sterile gloves. The skin over your port will be cleaned carefully with a germ-killing soap and allowed to dry. Your health care provider will gently pinch the port and insert a needle into it. Your health care provider will check for a blood return to make sure the port is in the vein and is still working (patent). If your port needs to remain accessed to get medicine continuously (constant infusion), your health care provider will place a clear bandage (dressing) over the needle site. The dressing and needle will need to be changed every week, or as told by your health care provider. What is flushing? Flushing helps keep the port working. Follow instructions from your  health care provider about how and when to flush the port. Ports are usually flushed with saline solution or a medicine called heparin. The need for flushing will depend on how the port is used: If the port is only used from time to time to give medicines or draw blood, the port may need to be flushed: Before and after medicines have been given. Before and after blood has been drawn. As part of routine maintenance. Flushing may be recommended every 4-6 weeks. If a constant infusion is running, the port may not need to be flushed. Throw away any syringes in a disposal container that is meant for sharp items (sharps container). You can buy a sharps container from a pharmacy, or you can make one by using an empty hard plastic bottle with a cover. How long will my port stay implanted? The port can stay in for as long as your health care provider thinks it is needed. When it is time for the port to come out, a surgery will be done to remove it. The surgery will be similar to the procedure that was done to put the port in. Follow these instructions at home: Caring for your port and port site Flush your port as told by your health care provider. If you need an infusion over several days, follow instructions from your health care provider about how to take care of your port site. Make sure you: Change your   dressing as told by your health care provider. Wash your hands with soap and water for at least 20 seconds before and after you change your dressing. If soap and water are not available, use alcohol-based hand sanitizer. Place any used dressings or infusion bags into a plastic bag. Throw that bag in the trash. Keep the dressing that covers the needle clean and dry. Do not get it wet. Do not use scissors or sharp objects near the infusion tubing. Keep any external tubes clamped, unless they are being used. Check your port site every day for signs of infection. Check for: Redness, swelling, or  pain. Fluid or blood. Warmth. Pus or a bad smell. Protect the skin around the port site. Avoid wearing bra straps that rub or irritate the site. Protect the skin around your port from seat belts. Place a soft pad over your chest if needed. Bathe or shower as told by your health care provider. The site may get wet as long as you are not actively receiving an infusion. General instructions  Return to your normal activities as told by your health care provider. Ask your health care provider what activities are safe for you. Carry a medical alert card or wear a medical alert bracelet at all times. This will let health care providers know that you have an implanted port in case of an emergency. Where to find more information American Cancer Society: www.cancer.org American Society of Clinical Oncology: www.cancer.net Contact a health care provider if: You have a fever or chills. You have redness, swelling, or pain at the port site. You have fluid or blood coming from your port site. Your incision feels warm to the touch. You have pus or a bad smell coming from the port site. Summary Implanted ports are usually placed in the chest for long-term IV access. Follow instructions from your health care provider about flushing the port and changing bandages (dressings). Take care of the area around your port by avoiding clothing that puts pressure on the area, and by watching for signs of infection. Protect the skin around your port from seat belts. Place a soft pad over your chest if needed. Contact a health care provider if you have a fever or you have redness, swelling, pain, fluid, or a bad smell at the port site. This information is not intended to replace advice given to you by your health care provider. Make sure you discuss any questions you have with your health care provider. Document Revised: 05/07/2021 Document Reviewed: 05/07/2021 Elsevier Patient Education  2023 Elsevier  Inc.  Hypomagnesemia Hypomagnesemia is a condition in which the level of magnesium in the blood is too low. Magnesium is a mineral that is found in many foods. It is used in many different processes in the body. Hypomagnesemia can affect every organ in the body. In severe cases, it can cause life-threatening problems. What are the causes? This condition may be caused by: Not getting enough magnesium in your diet or not having enough healthy foods to eat (malnutrition). Problems with magnesium absorption in the intestines. Dehydration. Excessive use of alcohol. Vomiting. Severe or long-term (chronic) diarrhea. Some medicines, including medicines that make you urinate more often (diuretics). Certain diseases, such as kidney disease, diabetes, celiac disease, and overactive thyroid. What are the signs or symptoms? Symptoms of this condition include: Loss of appetite, nausea, and vomiting. Involuntary shaking or trembling of a body part (tremor). Muscle weakness or tingling in the arms and legs. Sudden tightening of muscles (muscle   spasms). Confusion. Psychiatric issues, such as: Depression and irritability. Psychosis. A feeling of fluttering of the heart (palpitations). Seizures. These symptoms are more severe if magnesium levels drop suddenly. How is this diagnosed? This condition may be diagnosed based on: Your symptoms and medical history. A physical exam. Blood and urine tests. How is this treated? Treatment depends on the cause and the severity of the condition. It may be treated by: Taking a magnesium supplement. This can be taken in pill form. If the condition is severe, magnesium is usually given through an IV. Making changes to your diet. You may be directed to eat foods that have a lot of magnesium, such as green leafy vegetables, peas, beans, and nuts. Not drinking alcohol. If you are struggling not to drink, ask your health care provider for help. Follow these  instructions at home: Eating and drinking     Make sure that your diet includes foods with magnesium. Foods that have a lot of magnesium in them include: Green leafy vegetables, such as spinach and broccoli. Beans and peas. Nuts and seeds, such as almonds and sunflower seeds. Whole grains, such as whole grain bread and fortified cereals. Drink fluids that contain salts and minerals (electrolytes), such as sports drinks, when you are active. Do not drink alcohol. General instructions Take over-the-counter and prescription medicines only as told by your health care provider. Take magnesium supplements as directed if your health care provider tells you to take them. Have your magnesium levels monitored as told by your health care provider. Keep all follow-up visits. This is important. Contact a health care provider if: You get worse instead of better. Your symptoms return. Get help right away if: You develop severe muscle weakness. You have trouble breathing. You feel that your heart is racing. These symptoms may represent a serious problem that is an emergency. Do not wait to see if the symptoms will go away. Get medical help right away. Call your local emergency services (911 in the [place] Do not drive yourself to the hospital. Summary Hypomagnesemia is a condition in which the level of magnesium in the blood is too low. Hypomagnesemia can affect every organ in the body. Treatment may include eating more foods that contain magnesium, taking magnesium supplements, and not drinking alcohol. Have your magnesium levels monitored as told by your health care provider. This information is not intended to replace advice given to you by your health care provider. Make sure you discuss any questions you have with your health care provider. Document Revised: 04/02/2021 Document Reviewed: 04/02/2021 Elsevier Patient Education  2023 Elsevier Inc.  

## 2022-09-29 ENCOUNTER — Inpatient Hospital Stay: Payer: Medicare Other

## 2022-09-29 ENCOUNTER — Ambulatory Visit: Payer: Medicare Other | Admitting: Oncology

## 2022-09-29 ENCOUNTER — Ambulatory Visit: Payer: Medicare Other

## 2022-10-03 ENCOUNTER — Other Ambulatory Visit: Payer: Self-pay | Admitting: *Deleted

## 2022-10-03 DIAGNOSIS — C186 Malignant neoplasm of descending colon: Secondary | ICD-10-CM

## 2022-10-05 ENCOUNTER — Other Ambulatory Visit: Payer: Self-pay | Admitting: Oncology

## 2022-10-05 DIAGNOSIS — C186 Malignant neoplasm of descending colon: Secondary | ICD-10-CM

## 2022-10-06 ENCOUNTER — Encounter: Payer: Self-pay | Admitting: *Deleted

## 2022-10-06 ENCOUNTER — Inpatient Hospital Stay: Payer: Medicare Other

## 2022-10-06 ENCOUNTER — Inpatient Hospital Stay (HOSPITAL_BASED_OUTPATIENT_CLINIC_OR_DEPARTMENT_OTHER): Payer: Medicare Other | Admitting: Oncology

## 2022-10-06 VITALS — BP 129/74 | HR 70 | Temp 98.1°F | Resp 18 | Wt 263.0 lb

## 2022-10-06 VITALS — BP 148/83 | HR 68

## 2022-10-06 DIAGNOSIS — Z5111 Encounter for antineoplastic chemotherapy: Secondary | ICD-10-CM | POA: Diagnosis not present

## 2022-10-06 DIAGNOSIS — Z23 Encounter for immunization: Secondary | ICD-10-CM

## 2022-10-06 DIAGNOSIS — C186 Malignant neoplasm of descending colon: Secondary | ICD-10-CM

## 2022-10-06 DIAGNOSIS — C787 Secondary malignant neoplasm of liver and intrahepatic bile duct: Secondary | ICD-10-CM | POA: Diagnosis not present

## 2022-10-06 DIAGNOSIS — D6959 Other secondary thrombocytopenia: Secondary | ICD-10-CM | POA: Diagnosis not present

## 2022-10-06 DIAGNOSIS — Z5112 Encounter for antineoplastic immunotherapy: Secondary | ICD-10-CM | POA: Diagnosis not present

## 2022-10-06 LAB — CBC WITH DIFFERENTIAL (CANCER CENTER ONLY)
Abs Immature Granulocytes: 0.02 10*3/uL (ref 0.00–0.07)
Basophils Absolute: 0 10*3/uL (ref 0.0–0.1)
Basophils Relative: 1 %
Eosinophils Absolute: 0.5 10*3/uL (ref 0.0–0.5)
Eosinophils Relative: 8 %
HCT: 32.6 % — ABNORMAL LOW (ref 39.0–52.0)
Hemoglobin: 11 g/dL — ABNORMAL LOW (ref 13.0–17.0)
Immature Granulocytes: 0 %
Lymphocytes Relative: 16 %
Lymphs Abs: 1 10*3/uL (ref 0.7–4.0)
MCH: 29.4 pg (ref 26.0–34.0)
MCHC: 33.7 g/dL (ref 30.0–36.0)
MCV: 87.2 fL (ref 80.0–100.0)
Monocytes Absolute: 0.4 10*3/uL (ref 0.1–1.0)
Monocytes Relative: 6 %
Neutro Abs: 4.2 10*3/uL (ref 1.7–7.7)
Neutrophils Relative %: 69 %
Platelet Count: 149 10*3/uL — ABNORMAL LOW (ref 150–400)
RBC: 3.74 MIL/uL — ABNORMAL LOW (ref 4.22–5.81)
RDW: 14.9 % (ref 11.5–15.5)
WBC Count: 6.2 10*3/uL (ref 4.0–10.5)
nRBC: 0 % (ref 0.0–0.2)

## 2022-10-06 LAB — TOTAL PROTEIN, URINE DIPSTICK: Protein, ur: NEGATIVE mg/dL

## 2022-10-06 LAB — CMP (CANCER CENTER ONLY)
ALT: 18 U/L (ref 0–44)
AST: 15 U/L (ref 15–41)
Albumin: 3.8 g/dL (ref 3.5–5.0)
Alkaline Phosphatase: 94 U/L (ref 38–126)
Anion gap: 10 (ref 5–15)
BUN: 24 mg/dL — ABNORMAL HIGH (ref 8–23)
CO2: 31 mmol/L (ref 22–32)
Calcium: 9.8 mg/dL (ref 8.9–10.3)
Chloride: 97 mmol/L — ABNORMAL LOW (ref 98–111)
Creatinine: 1.46 mg/dL — ABNORMAL HIGH (ref 0.61–1.24)
GFR, Estimated: 52 mL/min — ABNORMAL LOW (ref >60)
Glucose, Bld: 205 mg/dL — ABNORMAL HIGH (ref 70–99)
Potassium: 4.2 mmol/L (ref 3.5–5.1)
Sodium: 138 mmol/L (ref 135–145)
Total Bilirubin: 0.7 mg/dL (ref 0.3–1.2)
Total Protein: 6.6 g/dL (ref 6.5–8.1)

## 2022-10-06 LAB — CEA (ACCESS): CEA (CHCC): 77.29 ng/mL — ABNORMAL HIGH (ref 0.00–5.00)

## 2022-10-06 MED ORDER — INFLUENZA VAC A&B SA ADJ QUAD 0.5 ML IM PRSY
0.5000 mL | PREFILLED_SYRINGE | Freq: Once | INTRAMUSCULAR | Status: AC
  Administered 2022-10-06: 0.5 mL via INTRAMUSCULAR
  Filled 2022-10-06: qty 0.5

## 2022-10-06 MED ORDER — SODIUM CHLORIDE 0.9 % IV SOLN
5.0000 mg/kg | Freq: Once | INTRAVENOUS | Status: AC
  Administered 2022-10-06: 600 mg via INTRAVENOUS
  Filled 2022-10-06: qty 16

## 2022-10-06 MED ORDER — FLUOROURACIL CHEMO INJECTION 500 MG/10ML
200.0000 mg/m2 | Freq: Once | INTRAVENOUS | Status: AC
  Administered 2022-10-06: 500 mg via INTRAVENOUS
  Filled 2022-10-06: qty 10

## 2022-10-06 MED ORDER — ATROPINE SULFATE 1 MG/ML IV SOLN
0.5000 mg | Freq: Once | INTRAVENOUS | Status: AC | PRN
  Administered 2022-10-06: 0.5 mg via INTRAVENOUS
  Filled 2022-10-06: qty 1

## 2022-10-06 MED ORDER — SODIUM CHLORIDE 0.9 % IV SOLN
10.0000 mg | Freq: Once | INTRAVENOUS | Status: AC
  Administered 2022-10-06: 10 mg via INTRAVENOUS
  Filled 2022-10-06: qty 10

## 2022-10-06 MED ORDER — SODIUM CHLORIDE 0.9 % IV SOLN: Freq: Once | INTRAVENOUS | Status: AC

## 2022-10-06 MED ORDER — SODIUM CHLORIDE 0.9 % IV SOLN
1800.0000 mg/m2 | INTRAVENOUS | Status: DC
  Administered 2022-10-06: 4450 mg via INTRAVENOUS
  Filled 2022-10-06: qty 89

## 2022-10-06 MED ORDER — PALONOSETRON HCL INJECTION 0.25 MG/5ML
0.2500 mg | Freq: Once | INTRAVENOUS | Status: AC
  Administered 2022-10-06: 0.25 mg via INTRAVENOUS
  Filled 2022-10-06: qty 5

## 2022-10-06 MED ORDER — SODIUM CHLORIDE 0.9 % IV SOLN
135.0000 mg/m2 | Freq: Once | INTRAVENOUS | Status: AC
  Administered 2022-10-06: 340 mg via INTRAVENOUS
  Filled 2022-10-06: qty 15

## 2022-10-06 MED ORDER — SODIUM CHLORIDE 0.9 % IV SOLN
200.0000 mg/m2 | Freq: Once | INTRAVENOUS | Status: AC
  Administered 2022-10-06: 492 mg via INTRAVENOUS
  Filled 2022-10-06: qty 24.6

## 2022-10-06 NOTE — Patient Instructions (Addendum)
[facility]   The chemotherapy medication bag should finish at 46 hours, 96 hours, or 7 days. For example, if your pump is scheduled for 46 hours and it was put on at 4:00 p.m., it should finish at 2:00 p.m. the day it is scheduled to come off regardless of your appointment time.     Estimated time to finish at 1:00 Wednesday, October 08, 2022.   If the display on your pump reads "Low Volume" and it is beeping, take the batteries out of the pump and come to the cancer center for it to be taken off.   If the pump alarms go off prior to the pump reading "Low Volume" then call [phone] and someone can assist you.  If the plunger comes out and the chemotherapy medication is leaking out, please use your home chemo spill kit to clean up the spill. Do NOT use paper towels or other household products.  If you have problems or questions regarding your pump, please call either [phone] (24 hours a day) or the cancer center Monday-Friday 8:00 a.m.- 4:30 p.m. at the clinic number and we will assist you. If you are unable to get assistance, then go to the nearest Emergency Department and ask the staff to contact the IV team for assistance.  Discharge Instructions: Thank you for choosing [facility] to provide your oncology and hematology care.   If you have a lab appointment with the [facility], please go directly to the [facility] and check in at the registration area.   Wear comfortable clothing and clothing appropriate for easy access to any Portacath or PICC line.   We strive to give you quality time with your provider. You may need to reschedule your appointment if you arrive late (15 or more minutes).  Arriving late affects you and other patients whose appointments are after yours.  Also, if you miss three or more appointments without notifying the office, you may be dismissed from the clinic at the provider's discretion.      For  prescription refill requests, have your pharmacy contact our office and allow 72 hours for refills to be completed.    Today you received the following chemotherapy and/or immunotherapy agents Avastin, Irinotecan, Leucovorin, Fluorouracil.      To help prevent nausea and vomiting after your treatment, we encourage you to take your nausea medication as directed.  BELOW ARE SYMPTOMS THAT SHOULD BE REPORTED IMMEDIATELY: *FEVER GREATER THAN 100.4 F (38 C) OR HIGHER *CHILLS OR SWEATING *NAUSEA AND VOMITING THAT IS NOT CONTROLLED WITH YOUR NAUSEA MEDICATION *UNUSUAL SHORTNESS OF BREATH *UNUSUAL BRUISING OR BLEEDING *URINARY PROBLEMS (pain or burning when urinating, or frequent urination) *BOWEL PROBLEMS (unusual diarrhea, constipation, pain near the anus) TENDERNESS IN MOUTH AND THROAT WITH OR WITHOUT PRESENCE OF ULCERS (sore throat, sores in mouth, or a toothache) UNUSUAL RASH, SWELLING OR PAIN  UNUSUAL VAGINAL DISCHARGE OR ITCHING   Items with * indicate a potential emergency and should be followed up as soon as possible or go to the Emergency Department if any problems should occur.  Please show the CHEMOTHERAPY ALERT CARD or IMMUNOTHERAPY ALERT CARD at check-in to the Emergency Department and triage nurse.  Should you have questions after your visit or need to cancel or reschedule your appointment, please contact [facility]  Dept: [phone]  and follow the prompts.  Office hours are 8:00 a.m. to 4:30 p.m. Monday - Friday. Please note that  voicemails left after 4:00 p.m. may not be returned until the following business day.  We are closed weekends and major holidays. You have access to a nurse at all times for urgent questions. Please call the main number to the clinic Dept: [phone] and follow the prompts.   For any non-urgent questions, you may also contact your provider using MyChart. We now offer e-Visits for anyone 63 and older to request care online  for non-urgent symptoms. For details visit [email or web address].   Also download the MyChart app! Go to the app store, search "MyChart", open the app, select [facility], and log in with your MyChart username and password.  Masks are optional in the cancer centers. If you would like for your care team to wear a mask while they are taking care of you, please let them know. You may have one support person who is at least 69 years old accompany you for your appointments.  Bevacizumab Injection What is this medication? BEVACIZUMAB (be va SIZ yoo mab) treats some types of cancer. It works by blocking a protein that causes cancer cells to grow and multiply. This helps to slow or stop the spread of cancer cells. It is a monoclonal antibody. This medicine may be used for other purposes; ask your health care provider or pharmacist if you have questions. COMMON BRAND NAME(S): Alymsys, Avastin, MVASI, Noah Charon What should I tell my care team before I take this medication? They need to know if you have any of these conditions: Blood clots Coughing up blood Having or recent surgery Heart failure High blood pressure History of a connection between 2 or more body parts that do not usually connect (fistula) History of a tear in your stomach or intestines Protein in your urine An unusual or allergic reaction to bevacizumab, other medications, foods, dyes, or preservatives Pregnant or trying to get pregnant Breast-feeding How should I use this medication? This medication is injected into a vein. It is given by your care team in a hospital or clinic setting. Talk to your care team the use of this medication in children. Special care may be needed. Overdosage: If you think you have taken too much of this medicine contact a poison control center or emergency room at once. NOTE: This medicine is only for you. Do not share this medicine with others. What if I miss a dose? Keep appointments for follow-up  doses. It is important not to miss your dose. Call your care team if you are unable to keep an appointment. What may interact with this medication? Interactions are not expected. This list may not describe all possible interactions. Give your health care provider a list of all the medicines, herbs, non-prescription drugs, or dietary supplements you use. Also tell them if you smoke, drink alcohol, or use illegal drugs. Some items may interact with your medicine. What should I watch for while using this medication? Your condition will be monitored carefully while you are receiving this medication. You may need blood work while taking this medication. This medication may make you feel generally unwell. This is not uncommon as chemotherapy can affect healthy cells as well as cancer cells. Report any side effects. Continue your course of treatment even though you feel ill unless your care team tells you to stop. This medication may increase your risk to bruise or bleed. Call your care team if you notice any unusual bleeding. Before having surgery, talk to your care team to make sure it is ok. This medication can  increase the risk of poor healing of your surgical site or wound. You will need to stop this medication for 28 days before surgery. After surgery, wait at least 28 days before restarting this medication. Make sure the surgical site or wound is healed enough before restarting this medication. Talk to your care team if questions. Talk to your care team if you may be pregnant. Serious birth defects can occur if you take this medication during pregnancy and for 6 months after the last dose. Contraception is recommended while taking this medication and for 6 months after the last dose. Your care team can help you find the option that works for you. Do not breastfeed while taking this medication and for 6 months after the last dose. This medication can cause infertility. Talk to your care team if you are  concerned about your fertility. What side effects may I notice from receiving this medication? Side effects that you should report to your care team as soon as possible: Allergic reactions--skin rash, itching, hives, swelling of the face, lips, tongue, or throat Bleeding--bloody or black, tar-like stools, vomiting blood or brown material that looks like coffee grounds, red or dark brown urine, small red or purple spots on skin, unusual bruising or bleeding Blood clot--pain, swelling, or warmth in the leg, shortness of breath, chest pain Heart attack--pain or tightness in the chest, shoulders, arms, or jaw, nausea, shortness of breath, cold or clammy skin, feeling faint or lightheaded Heart failure--shortness of breath, swelling of the ankles, feet, or hands, sudden weight gain, unusual weakness or fatigue Increase in blood pressure Infection--fever, chills, cough, sore throat, wounds that don't heal, pain or trouble when passing urine, general feeling of discomfort or being unwell Infusion reactions--chest pain, shortness of breath or trouble breathing, feeling faint or lightheaded Kidney injury--decrease in the amount of urine, swelling of the ankles, hands, or feet Stomach pain that is severe, does not go away, or gets worse Stroke--sudden numbness or weakness of the face, arm, or leg, trouble speaking, confusion, trouble walking, loss of balance or coordination, dizziness, severe headache, change in vision Sudden and severe headache, confusion, change in vision, seizures, which may be signs of posterior reversible encephalopathy syndrome (PRES) Side effects that usually do not require medical attention (report to your care team if they continue or are bothersome): Back pain Change in taste Diarrhea Dry skin Increased tears Nosebleed This list may not describe all possible side effects. Call your doctor for medical advice about side effects. You may report side effects to FDA at  1-800-FDA-1088. Where should I keep my medication? This medication is given in a hospital or clinic. It will not be stored at home. NOTE: This sheet is a summary. It may not cover all possible information. If you have questions about this medicine, talk to your doctor, pharmacist, or health care provider.  2023 Elsevier/Gold Standard (2022-03-07 00:00:00)  Irinotecan Injection What is this medication? IRINOTECAN (ir in oh TEE kan) treats some types of cancer. It works by slowing down the growth of cancer cells. This medicine may be used for other purposes; ask your health care provider or pharmacist if you have questions. COMMON BRAND NAME(S): Camptosar What should I tell my care team before I take this medication? They need to know if you have any of these conditions: Dehydration Diarrhea Infection, especially a viral infection, such as chickenpox, cold sores, herpes Liver disease Low blood cell levels (white cells, red cells, and platelets) Low levels of electrolytes, such as  calcium, magnesium, or potassium in your blood Recent or ongoing radiation An unusual or allergic reaction to irinotecan, other medications, foods, dyes, or preservatives If you or your partner are pregnant or trying to get pregnant Breast-feeding How should I use this medication? This medication is injected into a vein. It is given by your care team in a hospital or clinic setting. Talk to your care team about the use of this medication in children. Special care may be needed. Overdosage: If you think you have taken too much of this medicine contact a poison control center or emergency room at once. NOTE: This medicine is only for you. Do not share this medicine with others. What if I miss a dose? Keep appointments for follow-up doses. It is important not to miss your dose. Call your care team if you are unable to keep an appointment. What may interact with this medication? Do not take this medication with any  of the following: Cobicistat Itraconazole This medication may also interact with the following: Certain antibiotics, such as clarithromycin, rifampin, rifabutin Certain antivirals for HIV or AIDS Certain medications for fungal infections, such as ketoconazole, posaconazole, voriconazole Certain medications for seizures, such as carbamazepine, phenobarbital, phenytoin Gemfibrozil Nefazodone St. John's wort This list may not describe all possible interactions. Give your health care provider a list of all the medicines, herbs, non-prescription drugs, or dietary supplements you use. Also tell them if you smoke, drink alcohol, or use illegal drugs. Some items may interact with your medicine. What should I watch for while using this medication? Your condition will be monitored carefully while you are receiving this medication. You may need blood work while taking this medication. This medication may make you feel generally unwell. This is not uncommon as chemotherapy can affect healthy cells as well as cancer cells. Report any side effects. Continue your course of treatment even though you feel ill unless your care team tells you to stop. This medication can cause serious side effects. To reduce the risk, your care team may give you other medications to take before receiving this one. Be sure to follow the directions from your care team. This medication may affect your coordination, reaction time, or judgement. Do not drive or operate machinery until you know how this medication affects you. Sit up or stand slowly to reduce the risk of dizzy or fainting spells. Drinking alcohol with this medication can increase the risk of these side effects. This medication may increase your risk of getting an infection. Call your care team for advice if you get a fever, chills, sore throat, or other symptoms of a cold or flu. Do not treat yourself. Try to avoid being around people who are sick. Avoid taking medications  that contain aspirin, acetaminophen, ibuprofen, naproxen, or ketoprofen unless instructed by your care team. These medications may hide a fever. This medication may increase your risk to bruise or bleed. Call your care team if you notice any unusual bleeding. Be careful brushing or flossing your teeth or using a toothpick because you may get an infection or bleed more easily. If you have any dental work done, tell your dentist you are receiving this medication. Talk to your care team if you or your partner are pregnant or think either of you might be pregnant. This medication can cause serious birth defects if taken during pregnancy and for 6 months after the last dose. You will need a negative pregnancy test before starting this medication. Contraception is recommended while taking this  medication and for 6 months after the last dose. Your care team can help you find the option that works for you. Do not father a child while taking this medication and for 3 months after the last dose. Use a condom for contraception during this time period. Do not breastfeed while taking this medication and for 7 days after the last dose. This medication may cause infertility. Talk to your care team if you are concerned about your fertility. What side effects may I notice from receiving this medication? Side effects that you should report to your care team as soon as possible: Allergic reactions--skin rash, itching, hives, swelling of the face, lips, tongue, or throat Dry cough, shortness of breath or trouble breathing Increased saliva or tears, increased sweating, stomach cramping, diarrhea, small pupils, unusual weakness or fatigue, slow heartbeat Infection--fever, chills, cough, sore throat, wounds that don't heal, pain or trouble when passing urine, general feeling of discomfort or being unwell Kidney injury--decrease in the amount of urine, swelling of the ankles, hands, or feet Low red blood cell level--unusual  weakness or fatigue, dizziness, headache, trouble breathing Severe or prolonged diarrhea Unusual bruising or bleeding Side effects that usually do not require medical attention (report to your care team if they continue or are bothersome): Constipation Diarrhea Hair loss Loss of appetite Nausea Stomach pain This list may not describe all possible side effects. Call your doctor for medical advice about side effects. You may report side effects to FDA at 1-800-FDA-1088. Where should I keep my medication? This medication is given in a hospital or clinic. It will not be stored at home. NOTE: This sheet is a summary. It may not cover all possible information. If you have questions about this medicine, talk to your doctor, pharmacist, or health care provider.  2023 Elsevier/Gold Standard (2022-03-13 00:00:00)  Leucovorin Injection What is this medication? LEUCOVORIN (loo koe VOR in) prevents side effects from certain medications, such as methotrexate. It works by increasing folate levels. This helps protect healthy cells in your body. It may also be used to treat anemia caused by low levels of folate. It can also be used with fluorouracil, a type of chemotherapy, to treat colorectal cancer. It works by increasing the effects of fluorouracil in the body. This medicine may be used for other purposes; ask your health care provider or pharmacist if you have questions. What should I tell my care team before I take this medication? They need to know if you have any of these conditions: Anemia from low levels of vitamin B12 in the blood An unusual or allergic reaction to leucovorin, folic acid, other medications, foods, dyes, or preservatives Pregnant or trying to get pregnant Breastfeeding How should I use this medication? This medication is injected into a vein or a muscle. It is given by your care team in a hospital or clinic setting. Talk to your care team about the use of this medication in  children. Special care may be needed. Overdosage: If you think you have taken too much of this medicine contact a poison control center or emergency room at once. NOTE: This medicine is only for you. Do not share this medicine with others. What if I miss a dose? Keep appointments for follow-up doses. It is important not to miss your dose. Call your care team if you are unable to keep an appointment. What may interact with this medication? Capecitabine Fluorouracil Phenobarbital Phenytoin Primidone Trimethoprim;sulfamethoxazole This list may not describe all possible interactions. Give your health  care provider a list of all the medicines, herbs, non-prescription drugs, or dietary supplements you use. Also tell them if you smoke, drink alcohol, or use illegal drugs. Some items may interact with your medicine. What should I watch for while using this medication? Your condition will be monitored carefully while you are receiving this medication. This medication may increase the side effects of 5-fluorouracil. Tell your care team if you have diarrhea or mouth sores that do not get better or that get worse. What side effects may I notice from receiving this medication? Side effects that you should report to your care team as soon as possible: Allergic reactions--skin rash, itching, hives, swelling of the face, lips, tongue, or throat This list may not describe all possible side effects. Call your doctor for medical advice about side effects. You may report side effects to FDA at 1-800-FDA-1088. Where should I keep my medication? This medication is given in a hospital or clinic. It will not be stored at home. NOTE: This sheet is a summary. It may not cover all possible information. If you have questions about this medicine, talk to your doctor, pharmacist, or health care provider.  2023 Elsevier/Gold Standard (2022-04-08 00:00:00)  Fluorouracil Injection What is this medication? FLUOROURACIL  (flure oh YOOR a sil) treats some types of cancer. It works by slowing down the growth of cancer cells. This medicine may be used for other purposes; ask your health care provider or pharmacist if you have questions. COMMON BRAND NAME(S): Adrucil What should I tell my care team before I take this medication? They need to know if you have any of these conditions: Blood disorders Dihydropyrimidine dehydrogenase (DPD) deficiency Infection, such as chickenpox, cold sores, herpes Kidney disease Liver disease Poor nutrition Recent or ongoing radiation therapy An unusual or allergic reaction to fluorouracil, other medications, foods, dyes, or preservatives If you or your partner are pregnant or trying to get pregnant Breast-feeding How should I use this medication? This medication is injected into a vein. It is administered by your care team in a hospital or clinic setting. Talk to your care team about the use of this medication in children. Special care may be needed. Overdosage: If you think you have taken too much of this medicine contact a poison control center or emergency room at once. NOTE: This medicine is only for you. Do not share this medicine with others. What if I miss a dose? Keep appointments for follow-up doses. It is important not to miss your dose. Call your care team if you are unable to keep an appointment. What may interact with this medication? Do not take this medication with any of the following: Live virus vaccines This medication may also interact with the following: Medications that treat or prevent blood clots, such as warfarin, enoxaparin, dalteparin This list may not describe all possible interactions. Give your health care provider a list of all the medicines, herbs, non-prescription drugs, or dietary supplements you use. Also tell them if you smoke, drink alcohol, or use illegal drugs. Some items may interact with your medicine. What should I watch for while using  this medication? Your condition will be monitored carefully while you are receiving this medication. This medication may make you feel generally unwell. This is not uncommon as chemotherapy can affect healthy cells as well as cancer cells. Report any side effects. Continue your course of treatment even though you feel ill unless your care team tells you to stop. In some cases, you may be  given additional medications to help with side effects. Follow all directions for their use. This medication may increase your risk of getting an infection. Call your care team for advice if you get a fever, chills, sore throat, or other symptoms of a cold or flu. Do not treat yourself. Try to avoid being around people who are sick. This medication may increase your risk to bruise or bleed. Call your care team if you notice any unusual bleeding. Be careful brushing or flossing your teeth or using a toothpick because you may get an infection or bleed more easily. If you have any dental work done, tell your dentist you are receiving this medication. Avoid taking medications that contain aspirin, acetaminophen, ibuprofen, naproxen, or ketoprofen unless instructed by your care team. These medications may hide a fever. Do not treat diarrhea with over the counter products. Contact your care team if you have diarrhea that lasts more than 2 days or if it is severe and watery. This medication can make you more sensitive to the sun. Keep out of the sun. If you cannot avoid being in the sun, wear protective clothing and sunscreen. Do not use sun lamps, tanning beds, or tanning booths. Talk to your care team if you or your partner wish to become pregnant or think you might be pregnant. This medication can cause serious birth defects if taken during pregnancy and for 3 months after the last dose. A reliable form of contraception is recommended while taking this medication and for 3 months after the last dose. Talk to your care team  about effective forms of contraception. Do not father a child while taking this medication and for 3 months after the last dose. Use a condom while having sex during this time period. Do not breastfeed while taking this medication. This medication may cause infertility. Talk to your care team if you are concerned about your fertility. What side effects may I notice from receiving this medication? Side effects that you should report to your care team as soon as possible: Allergic reactions--skin rash, itching, hives, swelling of the face, lips, tongue, or throat Heart attack--pain or tightness in the chest, shoulders, arms, or jaw, nausea, shortness of breath, cold or clammy skin, feeling faint or lightheaded Heart failure--shortness of breath, swelling of the ankles, feet, or hands, sudden weight gain, unusual weakness or fatigue Heart rhythm changes--fast or irregular heartbeat, dizziness, feeling faint or lightheaded, chest pain, trouble breathing High ammonia level--unusual weakness or fatigue, confusion, loss of appetite, nausea, vomiting, seizures Infection--fever, chills, cough, sore throat, wounds that don't heal, pain or trouble when passing urine, general feeling of discomfort or being unwell Low red blood cell level--unusual weakness or fatigue, dizziness, headache, trouble breathing Pain, tingling, or numbness in the hands or feet, muscle weakness, change in vision, confusion or trouble speaking, loss of balance or coordination, trouble walking, seizures Redness, swelling, and blistering of the skin over hands and feet Severe or prolonged diarrhea Unusual bruising or bleeding Side effects that usually do not require medical attention (report to your care team if they continue or are bothersome): Dry skin Headache Increased tears Nausea Pain, redness, or swelling with sores inside the mouth or throat Sensitivity to light Vomiting This list may not describe all possible side effects.  Call your doctor for medical advice about side effects. You may report side effects to FDA at 1-800-FDA-1088. Where should I keep my medication? This medication is given in a hospital or clinic. It will not be stored  at home. NOTE: This sheet is a summary. It may not cover all possible information. If you have questions about this medicine, talk to your doctor, pharmacist, or health care provider.  2023 [employer]vier/Gold Standard (2022-03-04 00:00:00)

## 2022-10-06 NOTE — Progress Notes (Signed)
Patient seen by Dr. Sherrill today ? ?Vitals are within treatment parameters. ? ?Labs reviewed by Dr. Sherrill and are within treatment parameters. ? ?Per physician team, patient is ready for treatment and there are NO modifications to the treatment plan.  ?

## 2022-10-06 NOTE — Progress Notes (Signed)
[facility] OFFICE PROGRESS NOTE   Diagnosis: Colon cancer  INTERVAL HISTORY:   Mr. Martinique completed another cycle FOLFIRI on 09/23/2022.  He reports only a few episodes of diarrhea following this chemotherapy.  The diarrhea was relieved with Imodium.  No mouth sores, nausea, bleeding, or symptom of thrombosis.  Objective:  Vital signs in last 24 hours:  Blood pressure 129/74, pulse 70, temperature 98.1 F (36.7 C), temperature source Temporal, resp. rate 18, weight 263 lb (119.3 kg), SpO2 98 %.    HEENT: No thrush or ulcers Resp: Lungs clear bilaterally Cardio: Regular rate and rhythm GI: No hepatosplenomegaly, nontender Vascular: Varicosities and chronic stasis change at the left greater than right lower leg  Skin: Palms without erythema  Portacath/PICC-without erythema  Lab Results:  Lab Results  Component Value Date   WBC 6.2 10/06/2022   HGB 11.0 (L) 10/06/2022   HCT 32.6 (L) 10/06/2022   MCV 87.2 10/06/2022   PLT 149 (L) 10/06/2022   NEUTROABS 4.2 10/06/2022    CMP  Lab Results  Component Value Date   NA 137 09/23/2022   K 3.2 (L) 09/23/2022   CL 95 (L) 09/23/2022   CO2 30 09/23/2022   GLUCOSE 213 (H) 09/23/2022   BUN 20 09/23/2022   CREATININE 1.50 (H) 09/23/2022   CALCIUM 10.1 09/23/2022   PROT 7.2 09/23/2022   ALBUMIN 4.1 09/23/2022   AST 20 09/23/2022   ALT 18 09/23/2022   ALKPHOS 106 09/23/2022   BILITOT 0.8 09/23/2022   GFRNONAA 50 (L) 09/23/2022   GFRAA >60 03/07/2020    Lab Results  Component Value Date   CEA1 21.92 (H) 05/03/2021   CEA 98.22 (H) 09/15/2022     Medications: I have reviewed the patient's current medications.   Assessment/Plan:  Adenocarcinoma of the descending colon, stage II (T3 N0), status post a left colectomy 01/20/2017 MSI-stable, no loss of mismatch repair protein expression Elevated preoperative CEA Incomplete preoperative colonoscopy Colonoscopy 08/07/2017-multiple polyps removed including  tubular adenomas CT abdomen/pelvis 02/05/2018-no evidence of metastatic disease. Elevated CEA June 2019 CTs 06/29/2018-no evidence of metastatic disease, stable mild bilateral iliac adenopathy PET scan 08/03/2018- solitary hypermetabolic central right liver metastasis; asymmetric hypermetabolism right palatine tonsil with associated mild asymmetric soft tissue fullness on CT images MRI liver 08/25/2018- 2.6 cm mass in the central right liver consistent with metastasis, no other evidence of metastatic disease Ablation and biopsy of right liver lesion 09/29/2018-pathology revealed adenocarcinoma; foundation 1-KRAS G12V, NRAS wildtype, microsatellite stable, tumor mutational burden 1 Cycle 1 FOLFOX 10/25/2018 Cycle 2 FOLFOX 11/09/2018 Cycle 3 FOLFOX 11/23/2018 Cycle 4 FOLFOX 12/07/2018 Cycle 5 FOLFOX 12/21/2018 (oxaliplatin dose reduced secondary to thrombocytopenia) Cycle 6 FOLFOX 01/04/2019 Cycle 7 FOLFOX 01/18/2019 Cycle 8 FOLFOX 02/01/2019 Cycle 9 FOLFOX 02/15/2019 MRI abdomen 02/24/2019- ablation site in the liver without residual tumor identified.  Mild intrahepatic biliary dilatation distal to the ablation site.  Diffuse hepatic steatosis.  No new liver lesions identified. Cycle 10 FOLFOX 02/28/2019 (oxaliplatin held due to neuropathy and thrombocytopenia) Cycle 11 FOLFOX 03/14/2019 (oxaliplatin eliminated from the regimen due to neuropathy, 5-fluorouracil dose adjusted due to diarrhea and tearing) Cycle 12 FOLFOX 03/28/2019 (no oxaliplatin, 5-fluorouracil as per treatment 03/14/2019) CTs 09/08/2019-no evidence of recurrent disease, no evidence of recurrent disease at the hepatic ablation site, stable renal cysts CT abdomen/pelvis 03/06/2020-stable ablation site, no evidence of disease progression, stable gastric lipoma Colonoscopy 05/03/2020-polyp removed from the cecum-hyperplastic polyp Elevated CEA 09/06/2020 CTs 09/06/2020-no evidence of recurrent disease, stable ablation change in the  right liver Further  elevation of CEA 11/12/2020 PET 11/22/2020- small focus of hypermetabolism in the medial right liver at the previous ablation site, no other evidence of metastatic disease MRI liver 12/17/2020-interval contraction of the ablation site within the right hepatic lobe.  No new enhancing lesion to localize recurrence in the right hepatic lobe.  Subtle new duct dilatation in the right hepatic lobe which leads back to the same central region of increased metabolic activity on comparison FDG PET scan.  Combination of findings concerning for recurrent disease in the right hepatic lobe at the previous ablation site. Biopsy right liver mass near the site of prior ablation 02/14/2021-liver parenchyma with nonspecific changes and fragments of necrotic tissue SBRT 03/05/2021, 03/07/2021, 03/11/2021, 03/13/2021, 03/18/2021 MRI liver 06/03/2021-similar appearance of a 2.1 x 1.2 cm oval-shaped hypoenhancing lesion medially in the posterior right hepatic lobe associated with downstream biliary dilatation; this is the site of prior microwave ablation.  No definite abnormal enhancement.  No significant new liver lesions identified.  0.7 cm cystic lesion in the pancreatic head, possibly a postinflammatory lesion or small intraductal papillary mucinous neoplasm. MRI liver 01/14/2022-unchanged hypoenhancing lesion of the posterior right lobe of the liver measuring 2.0 x 1.3 cm, segmental biliary ductal dilatation posteriorly and inferiorly to the lesion with hyperemia of the liver parenchyma.  Findings consistent with ablated liver metastasis without MR evidence of residual disease.  No new liver lesions.  Hepatic steatosis.  Splenomegaly.  Unchanged 0.7 cm fluid signal cystic lesion of the ventral pancreatic uncinate. PET scan 05/14/2022-focal activity posterior right hepatic lobe which corresponds to hypoenhancing lesion on comparison MRI Biopsy liver lesion 06/16/2022-benign liver parenchyma with nonspecific inflammatory and fibrotic  changes, no evidence of malignancy.  The fibrotic inflammatory changes are felt to most likely represent nonspecific changes next to a mass lesion. MRI liver 06/25/2022-enhancing lesion posteriorly in the right hepatic lobe corresponding with the area of hypermetabolic activity on PET CT.  This lies posteromedial to a previous ablation defect and is consistent with local recurrence or new metastasis. Referred to Dr. Fayrene Helper at [facility], appointment scheduled 07/14/2022 CEA 234 on 08/11/2022 at [facility] CTs 08/14/2022-focal tumor recurrence posterior right hepatic lobe not well demonstrated on CT imaging; no evidence of new hepatic metastasis; no metastatic disease in the chest. Cycle 1 FOLFIRI/bevacizumab 09/01/2022 Cycle 2 FOLFIRI/bevacizumab 09/23/2022, irinotecan and 5-fluorouracil dose reduced due to diarrhea following cycle 1 Cycle 3 FOLFIRI/bevacizumab 10/06/2022   2.  Enlargement of the right testicle-hydrocele-Testicular ultrasound 02/26/2017-negative for mass, small bilateral hydroceles   3.  Hypertension   4.  Depression   5.  Family history of breast and ovarian cancer, negative genetic testing   6.  History of left leg edema, pain.   7.  Pigmented lesion right lower leg-refer to dermatology 06/30/2018   8.  Port-A-Cath placement 10/13/2018   9.  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  Diarrhea following cycle 1 FOLFIRI/bevacizumab   Disposition: Mr. Martinique appears well.  He tolerated the last cycle of chemotherapy with less toxicity after irinotecan and 5-FU were dose reduced.  He will complete another cycle of FOLFIRI/bevacizumab today.  We will follow-up on the CEA from today.  He will return for an office visit and chemotherapy in 2 weeks.  Betsy Coder, MD  10/06/2022  10:44 AM

## 2022-10-07 ENCOUNTER — Ambulatory Visit: Payer: Medicare Other | Admitting: Oncology

## 2022-10-07 ENCOUNTER — Other Ambulatory Visit: Payer: Medicare Other

## 2022-10-07 ENCOUNTER — Ambulatory Visit: Payer: Medicare Other

## 2022-10-08 ENCOUNTER — Inpatient Hospital Stay: Payer: Medicare Other

## 2022-10-08 VITALS — BP 146/86 | HR 68 | Temp 98.2°F | Resp 18

## 2022-10-08 DIAGNOSIS — C186 Malignant neoplasm of descending colon: Secondary | ICD-10-CM | POA: Diagnosis not present

## 2022-10-08 DIAGNOSIS — C787 Secondary malignant neoplasm of liver and intrahepatic bile duct: Secondary | ICD-10-CM | POA: Diagnosis not present

## 2022-10-08 DIAGNOSIS — Z5112 Encounter for antineoplastic immunotherapy: Secondary | ICD-10-CM | POA: Diagnosis not present

## 2022-10-08 DIAGNOSIS — Z23 Encounter for immunization: Secondary | ICD-10-CM | POA: Diagnosis not present

## 2022-10-08 DIAGNOSIS — Z5111 Encounter for antineoplastic chemotherapy: Secondary | ICD-10-CM | POA: Diagnosis not present

## 2022-10-08 DIAGNOSIS — D6959 Other secondary thrombocytopenia: Secondary | ICD-10-CM | POA: Diagnosis not present

## 2022-10-08 MED ORDER — HEPARIN SOD (PORK) LOCK FLUSH 100 UNIT/ML IV SOLN
500.0000 [IU] | Freq: Once | INTRAVENOUS | Status: AC | PRN
  Administered 2022-10-08: 500 [IU]

## 2022-10-08 MED ORDER — SODIUM CHLORIDE 0.9% FLUSH
10.0000 mL | INTRAVENOUS | Status: DC | PRN
  Administered 2022-10-08: 10 mL

## 2022-10-10 DIAGNOSIS — C229 Malignant neoplasm of liver, not specified as primary or secondary: Secondary | ICD-10-CM | POA: Diagnosis not present

## 2022-10-10 DIAGNOSIS — I499 Cardiac arrhythmia, unspecified: Secondary | ICD-10-CM | POA: Diagnosis not present

## 2022-10-10 DIAGNOSIS — I491 Atrial premature depolarization: Secondary | ICD-10-CM | POA: Diagnosis not present

## 2022-10-10 DIAGNOSIS — I469 Cardiac arrest, cause unspecified: Secondary | ICD-10-CM | POA: Diagnosis not present

## 2022-10-10 DIAGNOSIS — I4891 Unspecified atrial fibrillation: Secondary | ICD-10-CM | POA: Diagnosis not present

## 2022-10-10 DIAGNOSIS — R55 Syncope and collapse: Secondary | ICD-10-CM | POA: Diagnosis not present

## 2022-10-17 DEATH — deceased

## 2022-10-20 ENCOUNTER — Inpatient Hospital Stay: Payer: Medicare Other

## 2022-10-20 ENCOUNTER — Inpatient Hospital Stay: Payer: Medicare Other | Admitting: Nurse Practitioner

## 2022-10-22 ENCOUNTER — Inpatient Hospital Stay: Payer: Medicare Other

## 2022-11-03 ENCOUNTER — Ambulatory Visit: Payer: Medicare Other | Admitting: Oncology

## 2022-11-03 ENCOUNTER — Other Ambulatory Visit: Payer: Medicare Other

## 2022-11-03 ENCOUNTER — Ambulatory Visit: Payer: Medicare Other

## 2023-07-03 ENCOUNTER — Encounter: Payer: Self-pay | Admitting: Oncology

## 2023-07-03 NOTE — Telephone Encounter (Signed)
Entered in error

## 2023-07-08 ENCOUNTER — Other Ambulatory Visit (HOSPITAL_COMMUNITY): Payer: Self-pay

## 2024-12-07 ENCOUNTER — Other Ambulatory Visit: Payer: Self-pay
